# Patient Record
Sex: Female | Born: 1960 | Race: White | Hispanic: No | Smoking: Former smoker
Health system: Southern US, Community
[De-identification: ages and names within clinical notes are randomized; demographics above are authoritative.]

## PROBLEM LIST (undated history)

## (undated) DIAGNOSIS — F419 Anxiety disorder, unspecified: Secondary | ICD-10-CM

## (undated) DIAGNOSIS — T7840XA Allergy, unspecified, initial encounter: Secondary | ICD-10-CM

## (undated) HISTORY — PX: TONSILLECTOMY AND ADENOIDECTOMY: SUR1326

## (undated) HISTORY — DX: Allergy, unspecified, initial encounter: T78.40XA

## (undated) HISTORY — DX: Anxiety disorder, unspecified: F41.9

## (undated) HISTORY — PX: SKIN CANCER EXCISION: SHX779

---

## 1998-07-31 ENCOUNTER — Other Ambulatory Visit: Admission: RE | Admit: 1998-07-31 | Discharge: 1998-07-31 | Payer: Self-pay | Admitting: Obstetrics and Gynecology

## 1998-12-08 ENCOUNTER — Inpatient Hospital Stay (HOSPITAL_COMMUNITY): Admission: AD | Admit: 1998-12-08 | Discharge: 1998-12-11 | Payer: Self-pay | Admitting: Obstetrics and Gynecology

## 1998-12-09 ENCOUNTER — Encounter: Payer: Self-pay | Admitting: Obstetrics and Gynecology

## 1998-12-13 ENCOUNTER — Observation Stay (HOSPITAL_COMMUNITY): Admission: AD | Admit: 1998-12-13 | Discharge: 1998-12-14 | Payer: Self-pay | Admitting: Obstetrics and Gynecology

## 1999-01-06 ENCOUNTER — Inpatient Hospital Stay (HOSPITAL_COMMUNITY): Admission: AD | Admit: 1999-01-06 | Discharge: 1999-01-08 | Payer: Self-pay | Admitting: Obstetrics & Gynecology

## 1999-02-14 ENCOUNTER — Other Ambulatory Visit: Admission: RE | Admit: 1999-02-14 | Discharge: 1999-02-14 | Payer: Self-pay | Admitting: Obstetrics and Gynecology

## 2000-02-15 ENCOUNTER — Other Ambulatory Visit: Admission: RE | Admit: 2000-02-15 | Discharge: 2000-02-15 | Payer: Self-pay | Admitting: Obstetrics and Gynecology

## 2000-05-29 ENCOUNTER — Encounter: Payer: Self-pay | Admitting: Obstetrics and Gynecology

## 2000-05-29 ENCOUNTER — Encounter: Admission: RE | Admit: 2000-05-29 | Discharge: 2000-05-29 | Payer: Self-pay | Admitting: Obstetrics and Gynecology

## 2001-03-16 ENCOUNTER — Other Ambulatory Visit: Admission: RE | Admit: 2001-03-16 | Discharge: 2001-03-16 | Payer: Self-pay | Admitting: Obstetrics and Gynecology

## 2001-11-05 ENCOUNTER — Encounter: Payer: Self-pay | Admitting: Family Medicine

## 2001-11-05 ENCOUNTER — Encounter: Admission: RE | Admit: 2001-11-05 | Discharge: 2001-11-05 | Payer: Self-pay | Admitting: Family Medicine

## 2002-03-24 ENCOUNTER — Other Ambulatory Visit: Admission: RE | Admit: 2002-03-24 | Discharge: 2002-03-24 | Payer: Self-pay | Admitting: Obstetrics and Gynecology

## 2002-07-09 ENCOUNTER — Encounter: Payer: Self-pay | Admitting: Obstetrics and Gynecology

## 2002-07-09 ENCOUNTER — Encounter: Admission: RE | Admit: 2002-07-09 | Discharge: 2002-07-09 | Payer: Self-pay | Admitting: Obstetrics and Gynecology

## 2003-04-07 ENCOUNTER — Other Ambulatory Visit: Admission: RE | Admit: 2003-04-07 | Discharge: 2003-04-07 | Payer: Self-pay | Admitting: Obstetrics and Gynecology

## 2003-09-12 ENCOUNTER — Encounter: Admission: RE | Admit: 2003-09-12 | Discharge: 2003-09-12 | Payer: Self-pay | Admitting: Obstetrics and Gynecology

## 2004-09-17 ENCOUNTER — Ambulatory Visit (HOSPITAL_COMMUNITY): Admission: RE | Admit: 2004-09-17 | Discharge: 2004-09-17 | Payer: Self-pay | Admitting: Obstetrics and Gynecology

## 2004-09-21 ENCOUNTER — Encounter: Admission: RE | Admit: 2004-09-21 | Discharge: 2004-09-21 | Payer: Self-pay | Admitting: Family Medicine

## 2004-10-24 ENCOUNTER — Encounter: Admission: RE | Admit: 2004-10-24 | Discharge: 2004-11-20 | Payer: Self-pay | Admitting: Family Medicine

## 2005-10-02 ENCOUNTER — Ambulatory Visit (HOSPITAL_COMMUNITY): Admission: RE | Admit: 2005-10-02 | Discharge: 2005-10-02 | Payer: Self-pay | Admitting: Obstetrics and Gynecology

## 2006-07-16 ENCOUNTER — Ambulatory Visit: Payer: Self-pay | Admitting: Family Medicine

## 2006-07-23 ENCOUNTER — Ambulatory Visit: Payer: Self-pay | Admitting: Family Medicine

## 2006-09-04 ENCOUNTER — Ambulatory Visit: Payer: Self-pay | Admitting: Family Medicine

## 2006-10-07 ENCOUNTER — Ambulatory Visit (HOSPITAL_COMMUNITY): Admission: RE | Admit: 2006-10-07 | Discharge: 2006-10-07 | Payer: Self-pay | Admitting: Obstetrics and Gynecology

## 2007-03-02 ENCOUNTER — Encounter: Payer: Self-pay | Admitting: Family Medicine

## 2007-03-02 ENCOUNTER — Encounter (INDEPENDENT_AMBULATORY_CARE_PROVIDER_SITE_OTHER): Payer: Self-pay | Admitting: Family Medicine

## 2007-03-02 ENCOUNTER — Ambulatory Visit: Payer: Self-pay | Admitting: Family Medicine

## 2007-09-01 ENCOUNTER — Ambulatory Visit: Payer: Self-pay | Admitting: Family Medicine

## 2007-10-01 ENCOUNTER — Encounter (INDEPENDENT_AMBULATORY_CARE_PROVIDER_SITE_OTHER): Payer: Self-pay | Admitting: Family Medicine

## 2007-10-09 ENCOUNTER — Ambulatory Visit (HOSPITAL_COMMUNITY): Admission: RE | Admit: 2007-10-09 | Discharge: 2007-10-09 | Payer: Self-pay | Admitting: Obstetrics and Gynecology

## 2008-06-01 ENCOUNTER — Ambulatory Visit: Payer: Self-pay | Admitting: Internal Medicine

## 2008-08-05 ENCOUNTER — Telehealth (INDEPENDENT_AMBULATORY_CARE_PROVIDER_SITE_OTHER): Payer: Self-pay | Admitting: *Deleted

## 2008-08-18 ENCOUNTER — Ambulatory Visit: Payer: Self-pay | Admitting: Internal Medicine

## 2008-09-13 ENCOUNTER — Encounter: Admission: RE | Admit: 2008-09-13 | Discharge: 2008-09-13 | Payer: Self-pay | Admitting: Obstetrics and Gynecology

## 2008-10-10 ENCOUNTER — Ambulatory Visit (HOSPITAL_COMMUNITY): Admission: RE | Admit: 2008-10-10 | Discharge: 2008-10-10 | Payer: Self-pay | Admitting: Obstetrics and Gynecology

## 2008-10-19 ENCOUNTER — Encounter: Admission: RE | Admit: 2008-10-19 | Discharge: 2008-10-19 | Payer: Self-pay | Admitting: Obstetrics and Gynecology

## 2009-01-10 ENCOUNTER — Ambulatory Visit: Payer: Self-pay | Admitting: Internal Medicine

## 2009-01-17 ENCOUNTER — Telehealth: Payer: Self-pay | Admitting: Internal Medicine

## 2009-01-20 ENCOUNTER — Telehealth: Payer: Self-pay | Admitting: Internal Medicine

## 2009-02-27 ENCOUNTER — Ambulatory Visit: Payer: Self-pay | Admitting: Internal Medicine

## 2009-02-27 LAB — CONVERTED CEMR LAB
Albumin: 3.6 g/dL (ref 3.5–5.2)
Alkaline Phosphatase: 36 units/L — ABNORMAL LOW (ref 39–117)
BUN: 15 mg/dL (ref 6–23)
Basophils Absolute: 0 10*3/uL (ref 0.0–0.1)
Basophils Relative: 0.3 % (ref 0.0–3.0)
CO2: 28 meq/L (ref 19–32)
Chloride: 110 meq/L (ref 96–112)
Creatinine, Ser: 0.8 mg/dL (ref 0.4–1.2)
Eosinophils Relative: 2.1 % (ref 0.0–5.0)
Glucose, Bld: 79 mg/dL (ref 70–99)
HCT: 36.7 % (ref 36.0–46.0)
Hemoglobin: 12.7 g/dL (ref 12.0–15.0)
LDL Cholesterol: 57 mg/dL (ref 0–99)
Lymphs Abs: 2.2 10*3/uL (ref 0.7–4.0)
Monocytes Relative: 7.5 % (ref 3.0–12.0)
Neutro Abs: 3.6 10*3/uL (ref 1.4–7.7)
RBC: 3.65 M/uL — ABNORMAL LOW (ref 3.87–5.11)
RDW: 11.9 % (ref 11.5–14.6)
Total CHOL/HDL Ratio: 2
Triglycerides: 93 mg/dL (ref 0.0–149.0)

## 2009-02-28 ENCOUNTER — Encounter (INDEPENDENT_AMBULATORY_CARE_PROVIDER_SITE_OTHER): Payer: Self-pay | Admitting: *Deleted

## 2009-02-28 ENCOUNTER — Encounter: Payer: Self-pay | Admitting: Internal Medicine

## 2009-03-02 ENCOUNTER — Telehealth (INDEPENDENT_AMBULATORY_CARE_PROVIDER_SITE_OTHER): Payer: Self-pay | Admitting: *Deleted

## 2009-03-08 ENCOUNTER — Ambulatory Visit: Payer: Self-pay | Admitting: Internal Medicine

## 2009-03-08 ENCOUNTER — Telehealth (INDEPENDENT_AMBULATORY_CARE_PROVIDER_SITE_OTHER): Payer: Self-pay | Admitting: *Deleted

## 2009-03-10 ENCOUNTER — Encounter (INDEPENDENT_AMBULATORY_CARE_PROVIDER_SITE_OTHER): Payer: Self-pay | Admitting: *Deleted

## 2009-03-10 ENCOUNTER — Ambulatory Visit: Payer: Self-pay | Admitting: Internal Medicine

## 2009-08-02 ENCOUNTER — Ambulatory Visit: Payer: Self-pay | Admitting: Internal Medicine

## 2009-10-16 ENCOUNTER — Encounter: Admission: RE | Admit: 2009-10-16 | Discharge: 2009-10-16 | Payer: Self-pay | Admitting: Obstetrics and Gynecology

## 2009-10-24 ENCOUNTER — Encounter: Admission: RE | Admit: 2009-10-24 | Discharge: 2009-10-24 | Payer: Self-pay | Admitting: Obstetrics and Gynecology

## 2009-12-26 ENCOUNTER — Ambulatory Visit: Payer: Self-pay | Admitting: Family Medicine

## 2010-02-15 ENCOUNTER — Encounter: Admission: RE | Admit: 2010-02-15 | Discharge: 2010-02-15 | Payer: Self-pay | Admitting: Obstetrics & Gynecology

## 2010-05-18 ENCOUNTER — Ambulatory Visit: Payer: Self-pay | Admitting: Internal Medicine

## 2010-05-18 DIAGNOSIS — R109 Unspecified abdominal pain: Secondary | ICD-10-CM

## 2010-05-18 DIAGNOSIS — M79609 Pain in unspecified limb: Secondary | ICD-10-CM | POA: Insufficient documentation

## 2010-05-18 LAB — CONVERTED CEMR LAB
Bilirubin Urine: NEGATIVE
Glucose, Urine, Semiquant: NEGATIVE
Ketones, urine, test strip: NEGATIVE
Protein, U semiquant: NEGATIVE
Urobilinogen, UA: 0.2
pH: 6

## 2010-05-19 ENCOUNTER — Encounter: Payer: Self-pay | Admitting: Internal Medicine

## 2010-07-24 ENCOUNTER — Ambulatory Visit: Payer: Self-pay | Admitting: Internal Medicine

## 2010-08-27 ENCOUNTER — Ambulatory Visit: Payer: Self-pay | Admitting: Family Medicine

## 2010-10-17 ENCOUNTER — Encounter
Admission: RE | Admit: 2010-10-17 | Discharge: 2010-10-17 | Payer: Self-pay | Source: Home / Self Care | Attending: Obstetrics and Gynecology | Admitting: Obstetrics and Gynecology

## 2010-10-19 ENCOUNTER — Ambulatory Visit: Payer: Self-pay | Admitting: Internal Medicine

## 2010-10-19 DIAGNOSIS — J0110 Acute frontal sinusitis, unspecified: Secondary | ICD-10-CM | POA: Insufficient documentation

## 2010-10-28 HISTORY — PX: REFRACTIVE SURGERY: SHX103

## 2010-11-01 ENCOUNTER — Encounter
Admission: RE | Admit: 2010-11-01 | Discharge: 2010-11-01 | Payer: Self-pay | Source: Home / Self Care | Attending: Obstetrics and Gynecology | Admitting: Obstetrics and Gynecology

## 2010-11-05 ENCOUNTER — Telehealth: Payer: Self-pay | Admitting: Internal Medicine

## 2010-11-18 ENCOUNTER — Encounter: Payer: Self-pay | Admitting: Obstetrics and Gynecology

## 2010-11-27 NOTE — Assessment & Plan Note (Signed)
Summary: FOOT INFECTION/GROWING PROBLEM//KN   Vital Signs:  Patient profile:   50 year old female Weight:      116 pounds Temp:     98.6 degrees F oral Pulse rate:   72 / minute Resp:     16 per minute BP sitting:   120 / 78  (left arm) Cuff size:   regular  Vitals Entered By: Shonna Chock CMA (May 18, 2010 3:48 PM) CC: 1.) Foot infection   2.) Examine mouth- sore on tounge x several days and roof of mouth feels weird  3.) Burning (vaginal/pubic area-not when urinating, feels like the burning is on the inside   Primary Care Provider:  Alwyn Ren  CC:  1.) Foot infection   2.) Examine mouth- sore on tounge x several days and roof of mouth feels weird  3.) Burning (vaginal/pubic area-not when urinating and feels like the burning is on the inside.  History of Present Illness: #1)Burning pain  intermittently @ base of L 5th toe related to foot wear & activity; better with tennis shoes. #2) sore  tongue tip X 5 days; sore of palate this am; #3) vaginal burning > 1 week. Menses completed today.Gyn exam WNL 07/2009.  Current Medications (verified): 1)  Acyclovir 400 Mg  Tabs (Acyclovir) .Marland Kitchen.. 1 By Mouth Three Times A Day For 5 Days and Prn 2)  Junel 1/20 1-20 Mg-Mcg Tabs (Norethindrone Acet-Ethinyl Est)  Allergies (verified): No Known Drug Allergies  Review of Systems General:  Denies chills, fever, and sweats; Weight up 3#  on  purpose. ENT:  Denies difficulty swallowing, ear discharge, earache, nasal congestion, postnasal drainage, and sore throat. GU:  Denies abnormal vaginal bleeding, discharge, dysuria, and hematuria. MS:  Denies joint pain, joint redness, and joint swelling. Derm:  Denies lesion(s) and rash.  Physical Exam  General:  Thin but well-nourished,in no acute distress; alert,appropriate and cooperative throughout examination Mouth:  Tiny erythematous tongue lesion & small papule anterior palate behind teeth Pulses:  R and L dorsalis pedis and posterior tibial pulses are  full and equal bilaterally Extremities:  No clubbing, cyanosis, edema. No pain toROM or compression or palpation L foot Skin:  Intact without suspicious lesions or rashes Cervical Nodes:  No lymphadenopathy noted Axillary Nodes:  No palpable lymphadenopathy   Impression & Recommendations:  Problem # 1:  FOOT PAIN, LEFT (ICD-729.5) ? Morton's Neuroma  Problem # 2:  ORAL APHTHAE (ICD-528.2) PMH of oral herpes  Problem # 3:  PELVIC  PAIN (ICD-789.09)  Orders: UA Dipstick w/o Micro (manual) (29562)  Complete Medication List: 1)  Acyclovir 400 Mg Tabs (Acyclovir) .Marland Kitchen.. 1 by mouth three times a day for 5 days and prn 2)  Junel 1/20 1-20 Mg-mcg Tabs (Norethindrone acet-ethinyl est)  Patient Instructions: 1)  Keep Diary of incidences of oral lesions; consider suppressive therapy with > 7-9 episodes/ year. Support shoes ; Podiatry or Foot Specialist if no better. Prescriptions: ACYCLOVIR 400 MG  TABS (ACYCLOVIR) 1 by mouth three times a day for 5 days and prn  #90 x 1   Entered and Authorized by:   Marga Melnick MD   Signed by:   Marga Melnick MD on 05/18/2010   Method used:   Print then Give to Patient   RxID:   [id number]   Laboratory Results   Urine Tests    Routine Urinalysis   Color: lt. yellow Appearance: Clear Glucose: negative   (Normal Range: Negative) Bilirubin: negative   (Normal Range: Negative) Ketone: negative   (  Normal Range: Negative) Spec. Gravity: 1.015   (Normal Range: 1.003-1.035) Blood: large   (Normal Range: Negative) pH: 6.0   (Normal Range: 5.0-8.0) Protein: negative   (Normal Range: Negative) Urobilinogen: 0.2   (Normal Range: 0-1) Nitrite: negative   (Normal Range: Negative) Leukocyte Esterace: negative   (Normal Range: Negative)    Comments: LMP: 05/12/2010 (Saturday)

## 2010-11-27 NOTE — Assessment & Plan Note (Signed)
Summary: SINUS INFECTION//PH   Vital Signs:  Patient profile:   50 year old female Weight:      117.2 pounds BMI:     20.19 Temp:     98.1 degrees F oral Pulse rate:   64 / minute Resp:     12 per minute BP sitting:   108 / 60  (left arm) Cuff size:   regular  Vitals Entered By: Shonna Chock CMA (July 24, 2010 11:56 AM) CC: Sinus symptoms x 8 days, URI symptoms   Primary Care Evelyn Moch:  Alwyn Ren  CC:  Sinus symptoms x 8 days and URI symptoms.  History of Present Illness: URI Symptoms      This is a 50 year old woman who presents with URI symptoms X 8 days.  The patient reports nasal congestion,slight  purulent nasal discharge, and slightly productive cough, but denies sore throat and earache.  The patient denies fever.  The patient also reports headache.  Risk factors for Strep sinusitis include bilateral facial pain.  The patient denies the following risk factors for Strep sinusitis: tooth pain and tender adenopathy.  Rx: OTC multi cold symptom med  Current Medications (verified): 1)  Acyclovir 400 Mg  Tabs (Acyclovir) .Marland Kitchen.. 1 By Mouth Three Times A Day For 5 Days and Prn 2)  Junel 1/20 1-20 Mg-Mcg Tabs (Norethindrone Acet-Ethinyl Est)  Allergies (verified): No Known Drug Allergies  Physical Exam  General:  Thin,in no acute distress; alert,appropriate and cooperative throughout examination Ears:  External ear exam shows no significant lesions or deformities.  Otoscopic examination reveals clear canals, tympanic membranes are intact bilaterally without bulging, retraction, inflammation or discharge. Hearing is grossly normal bilaterally. Nose:  External nasal examination shows no deformity or inflammation. Nasal mucosa are pink and moist without lesions or exudates. Mouth:  Oral mucosa and oropharynx without lesions or exudates.  Teeth in good repair. Lungs:  Normal respiratory effort, chest expands symmetrically. Lungs are clear to auscultation, no crackles or  wheezes. Cervical Nodes:  No lymphadenopathy noted Axillary Nodes:  No palpable lymphadenopathy   Impression & Recommendations:  Problem # 1:  SINUSITIS - ACUTE-NOS (ICD-461.9)  Her updated medication list for this problem includes:    Smz-tmp Ds 800-160 Mg Tabs (Sulfamethoxazole-trimethoprim) .Marland Kitchen... 1 two times a day with 8 oz water  Complete Medication List: 1)  Acyclovir 400 Mg Tabs (Acyclovir) .Marland Kitchen.. 1 by mouth three times a day for 5 days and prn 2)  Junel 1/20 1-20 Mg-mcg Tabs (Norethindrone acet-ethinyl est) 3)  Smz-tmp Ds 800-160 Mg Tabs (Sulfamethoxazole-trimethoprim) .Marland Kitchen.. 1 two times a day with 8 oz water  Patient Instructions: 1)  Neti pot once daily until sinuses are clear. 2)  Drink as much NON dairy  fluid as you can tolerate for the next few days. Prescriptions: SMZ-TMP DS 800-160 MG TABS (SULFAMETHOXAZOLE-TRIMETHOPRIM) 1 two times a day with 8 oz water  #20 x 0   Entered and Authorized by:   Marga Melnick MD   Signed by:   Marga Melnick MD on 07/24/2010   Method used:   Faxed to ...       [facility] (retail)       [address]       [place], [place]  [address]       Ph: [phone]       Fax: [phone]   RxID:   [id number]

## 2010-11-27 NOTE — Assessment & Plan Note (Signed)
Summary: flu shot///sph  Nurse Visit Flu Vaccine Consent Questions     Do you have a history of severe allergic reactions to this vaccine? no    Any prior history of allergic reactions to egg and/or gelatin? no    Do you have a sensitivity to the preservative Thimersol? no    Do you have a past history of Guillan-Barre Syndrome? no    Do you currently have an acute febrile illness? no    Have you ever had a severe reaction to latex? no    Vaccine information given and explained to patient? yes    Are you currently pregnant? no    Lot Number:[id number]   Exp Date:04/27/2011   Site Given  Left Deltoid IM    Allergies: No Known Drug Allergies  Orders Added: 1)  Admin 1st Vaccine [90471] 2)  Flu Vaccine 23yrs + [16109]

## 2010-11-27 NOTE — Letter (Signed)
Summary: Call a Nurse  Call a Nurse   Imported By: Lanelle Bal 05/29/2010 11:25:18  _____________________________________________________________________  External Attachment:    Type:   Image     Comment:   External Document

## 2010-11-27 NOTE — Assessment & Plan Note (Signed)
Summary: sinus infection x 1 week//lch   Vital Signs:  Patient profile:   50 year old female Height:      64 inches Weight:      125 pounds BMI:     21.53 Temp:     98.2 degrees F oral Pulse rate:   67 / minute Pulse rhythm:   regular BP sitting:   102 / 70  (left arm) Cuff size:   regular  Vitals Entered By: Army Fossa CMA (December 26, 2009 1:33 PM) CC: Pt c/o pressure in face, HA, drainage x several weeks, URI symptoms   History of Present Illness:       This is a 50 year old woman who presents with URI symptoms.  The symptoms began 3 weeks ago.  The patient complains of nasal congestion, purulent nasal discharge, dry cough, and sick contacts.  The patient denies fever, low-grade fever (<100.5 degrees), fever of 100.5-103 degrees, fever of 103.1-104 degrees, fever to >104 degrees, stiff neck, dyspnea, wheezing, rash, vomiting, diarrhea, use of an antipyretic, and response to antipyretic.  The patient also reports headache.  The patient denies itchy watery eyes, itchy throat, sneezing, seasonal symptoms, response to antihistamine, muscle aches, and severe fatigue.  The patient denies the following risk factors for Strep sinusitis: unilateral facial pain, unilateral nasal discharge, poor response to decongestant, double sickening, tooth pain, Strep exposure, tender adenopathy, and absence of cough.    Current Medications (verified): 1)  Acyclovir 400 Mg  Tabs (Acyclovir) .Marland Kitchen.. 1 By Mouth Three Times A Day For 5 Days and Prn 2)  Junel 1/20 1-20 Mg-Mcg Tabs (Norethindrone Acet-Ethinyl Est) 3)  Ceftin 500 Mg Tabs (Cefuroxime Axetil) .Marland Kitchen.. 1 By Mouth Two Times A Day 4)  Nasonex 50 Mcg/act Susp (Mometasone Furoate) .... 2 Sprays Each Nostril Once Daily  Allergies (verified): No Known Drug Allergies  Past History:  Past medical, surgical, family and social histories (including risk factors) reviewed for relevance to current acute and chronic problems.  Past Medical History: Reviewed  history from 03/08/2009 and no changes required. Herpes Labialis, recurrent; Plantar Fascitis, PMH of 2008  Past Surgical History: Reviewed history from 03/08/2009 and no changes required. G2 P2; Wisdom teeth extraction  Family History: Reviewed history from 03/08/2009 and no changes required. Father: CVAs,CAD,pacer,prostate CA,? Parkinson's Mother: Alsheimer's, Osteoporosis Siblings: neg; MGF Alsheimer's ; M aunt ?Gyn CA ; Maunt Alsheimer's  Social History: Reviewed history from 03/08/2009 and no changes required. Occupation: Part time Substitute Teaching Married Former Smoker: quit 12/09, sporadic previously Alcohol use-yes Regular exercise-yes: CVE 1.5 hrs daily   Review of Systems      See HPI  Physical Exam  General:  Well-developed,well-nourished,in no acute distress; alert,appropriate and cooperative throughout examination Ears:  External ear exam shows no significant lesions or deformities.  Otoscopic examination reveals clear canals, tympanic membranes are intact bilaterally without bulging, retraction, inflammation or discharge. Hearing is grossly normal bilaterally. Nose:  L frontal sinus tenderness, L maxillary sinus tenderness, R frontal sinus tenderness, and R maxillary sinus tenderness.   Mouth:  Oral mucosa and oropharynx without lesions or exudates.  Teeth in good repair. Neck:  No deformities, masses, or tenderness noted. Lungs:  Normal respiratory effort, chest expands symmetrically. Lungs are clear to auscultation, no crackles or wheezes. Heart:  Normal rate and regular rhythm. S1 and S2 normal without gallop, murmur, click, rub or other extra sounds. Psych:  Cognition and judgment appear intact. Alert and cooperative with normal attention span and concentration. No apparent  delusions, illusions, hallucinations   Impression & Recommendations:  Problem # 1:  SINUSITIS - ACUTE-NOS (ICD-461.9)  The following medications were removed from the medication list:     Amoxicillin-pot Clavulanate 500-125 Mg Tabs (Amoxicillin-pot clavulanate) .Marland Kitchen... 1 q 12 hrs with a meal    Flagyl 250 Mg Tabs (Metronidazole) .Marland Kitchen... Take  three times a day (no alcohol) Her updated medication list for this problem includes:    Ceftin 500 Mg Tabs (Cefuroxime axetil) .Marland Kitchen... 1 by mouth two times a day    Nasonex 50 Mcg/act Susp (Mometasone furoate) .Marland Kitchen... 2 sprays each nostril once daily  Instructed on treatment. Call if symptoms persist or worsen.   Complete Medication List: 1)  Acyclovir 400 Mg Tabs (Acyclovir) .Marland Kitchen.. 1 by mouth three times a day for 5 days and prn 2)  Junel 1/20 1-20 Mg-mcg Tabs (Norethindrone acet-ethinyl est) 3)  Ceftin 500 Mg Tabs (Cefuroxime axetil) .Marland Kitchen.. 1 by mouth two times a day 4)  Nasonex 50 Mcg/act Susp (Mometasone furoate) .... 2 sprays each nostril once daily Prescriptions: NASONEX 50 MCG/ACT SUSP (MOMETASONE FUROATE) 2 sprays each nostril once daily  #1 x 0   Entered and Authorized by:   Loreen Freud DO   Signed by:   Loreen Freud DO on 12/26/2009   Method used:   Historical   RxID:   [id number] CEFTIN 500 MG TABS (CEFUROXIME AXETIL) 1 by mouth two times a day  #20 x 0   Entered and Authorized by:   Loreen Freud DO   Signed by:   Loreen Freud DO on 12/26/2009   Method used:   Electronically to        [facility] (retail)       [address]       [place], [place]  [address]       Ph: [phone]       Fax: [phone]   RxID:   [id number] ACYCLOVIR 400 MG  TABS (ACYCLOVIR) 1 by mouth three times a day for 5 days and prn  #90 x 1   Entered and Authorized by:   Loreen Freud DO   Signed by:   Loreen Freud DO on 12/26/2009   Method used:   Electronically to        [facility] (retail)       [address]       [place], [place]  [address]       Ph: [phone]       Fax: [phone]   RxID:   [id number]

## 2010-11-29 NOTE — Assessment & Plan Note (Signed)
Summary: sinus inf for few days, no fever///sph   Vital Signs:  Patient profile:   50 year old female Weight:      114.8 pounds BMI:     19.78 Temp:     98.2 degrees F oral Pulse rate:   72 / minute Resp:     15 per minute BP sitting:   104 / 70  (left arm) Cuff size:   regular  Vitals Entered By: Shonna Chock CMA (October 19, 2010 11:17 AM) CC: Sinus Infection x 2-3 days , URI symptoms   Primary Care Provider:  Alwyn Ren  CC:  Sinus Infection x 2-3 days  and URI symptoms.  History of Present Illness:      This is a 50 year old woman who presents with URI symptoms; onset 12/18 as throat congestion.  The patient denies purulent nasal discharge, sore throat, productive cough, and earache.  The patient denies fever, dyspnea, and wheezing.  The patient also reports headache & some bilateral facial pain.  The patient denies the following risk factors for Strep sinusitis: tooth pain and tender adenopathy. Rx: Neti pot .   Current Medications (verified): 1)  Acyclovir 400 Mg  Tabs (Acyclovir) .Marland Kitchen.. 1 By Mouth Three Times A Day For 5 Days and Prn 2)  Junel 1/20 1-20 Mg-Mcg Tabs (Norethindrone Acet-Ethinyl Est)  Allergies (verified): No Known Drug Allergies  Physical Exam  General:  Thin,in no acute distress; alert,appropriate and cooperative throughout examination Ears:  External ear exam shows no significant lesions or deformities.  Otoscopic examination reveals clear canals, tympanic membranes are intact bilaterally without bulging, retraction, inflammation or discharge. Hearing is grossly normal bilaterally. Nose:  External nasal examination shows no deformity or inflammation. Nasal mucosa are pink and moist without lesions or exudates. Hyponasal speech Mouth:  Oral mucosa and oropharynx without lesions or exudates.  Teeth in good repair. Lungs:  Normal respiratory effort, chest expands symmetrically. Lungs are clear to auscultation, no crackles or wheezes. Heart:  normal rate, regular  rhythm, no gallop, no rub, and grade 1 /6 systolic murmur @ LSB.   Cervical Nodes:  No lymphadenopathy noted Axillary Nodes:  No palpable lymphadenopathy   Impression & Recommendations:  Problem # 1:  ACUTE FRONTAL SINUSITIS (ICD-461.1)  The following medications were removed from the medication list:    Smz-tmp Ds 800-160 Mg Tabs (Sulfamethoxazole-trimethoprim) .Marland Kitchen... 1 two times a day with 8 oz water Her updated medication list for this problem includes:    Amoxicillin 500 Mg Caps (Amoxicillin) .Marland Kitchen... 1 three times a day  Complete Medication List: 1)  Acyclovir 400 Mg Tabs (Acyclovir) .Marland Kitchen.. 1 by mouth three times a day for 5 days and prn 2)  Junel 1/20 1-20 Mg-mcg Tabs (Norethindrone acet-ethinyl est) 3)  Amoxicillin 500 Mg Caps (Amoxicillin) .Marland Kitchen.. 1 three times a day  Patient Instructions: 1)  Neti pot once daily - two times a day  followed by Nasonex . 2)  Drink as much  NON dairy fluid as you can tolerate for the next few days. Prescriptions: AMOXICILLIN 500 MG CAPS (AMOXICILLIN) 1 three times a day  #30 x 0   Entered and Authorized by:   Marga Melnick MD   Signed by:   Marga Melnick MD on 10/19/2010   Method used:   Print then Give to Patient   RxID:   [id number]    Orders Added: 1)  Est. Patient Level III [14782]

## 2010-11-29 NOTE — Progress Notes (Signed)
Summary: ABX request  Phone Note Call from Patient Call back at Work Phone [phone]   Summary of Call: Patient left message on triage that she is not feeling better from recent sinus infection and would like another round of ABX. On 12/23 the pt was given AMOXICILLIN 500 MG CAPS (AMOXICILLIN) 1 three times a day  #30 x 0.  Please advise. Initial call taken by: Lucious Groves CMA,  November 05, 2010 10:51 AM  Follow-up for Phone Call        see generic Flagyl Rx  Additional Follow-up for Phone Call Additional follow up Details #1::        RX was given while patient was in office with her son. Additional Follow-up by: Lucious Groves CMA,  November 05, 2010 4:17 PM    New/Updated Medications: METRONIDAZOLE 500 MG TABS (METRONIDAZOLE) 1 three times a day (avoid alcohol on this) Prescriptions: METRONIDAZOLE 500 MG TABS (METRONIDAZOLE) 1 three times a day (avoid alcohol on this)  #21 x 0   Entered by:   Lucious Groves CMA   Authorized by:   Marga Melnick MD   Signed by:   Lucious Groves CMA on 11/05/2010   Method used:   Print then Give to Patient   RxID:   [id number]

## 2010-12-13 ENCOUNTER — Telehealth (INDEPENDENT_AMBULATORY_CARE_PROVIDER_SITE_OTHER): Payer: Self-pay | Admitting: *Deleted

## 2010-12-19 NOTE — Progress Notes (Signed)
Summary: Acyclovir refill  Phone Note Refill Request Call back at cell----[phone] Message from:  Patient on December 13, 2010 11:22 AM  Refills Requested: Medication #1:  ACYCLOVIR 400 MG  TABS 1 by mouth three times a day for 5 days and prn [facility] [address]  Initial call taken by: Jerolyn Shin,  December 13, 2010 11:23 AM    Prescriptions: ACYCLOVIR 400 MG  TABS (ACYCLOVIR) 1 by mouth three times a day for 5 days and prn  #90 x 2   Entered by:   Shonna Chock CMA   Authorized by:   Marga Melnick MD   Signed by:   Shonna Chock CMA on 12/13/2010   Method used:   Electronically to        [facility] (retail)       [address]       [place], [place]  [address]       Ph: [phone]       Fax: [phone]   RxID:   [id number]

## 2011-08-22 ENCOUNTER — Ambulatory Visit (INDEPENDENT_AMBULATORY_CARE_PROVIDER_SITE_OTHER): Payer: BC Managed Care – PPO

## 2011-08-22 DIAGNOSIS — Z23 Encounter for immunization: Secondary | ICD-10-CM

## 2011-09-05 ENCOUNTER — Telehealth: Payer: Self-pay

## 2011-09-05 NOTE — Telephone Encounter (Signed)
Pt left message to c/o tick bite on stomach. She states that she pulled it off and has a tiny red mark there.   Per Dr. Alwyn Ren, pt needs to watch for rash and/or fever and to call office to be seen or visit ER is sxs become emergent.  Left message on personally identified voicemail to notify pt of MD's message and advised her to call office with questions or concerns

## 2011-09-09 ENCOUNTER — Ambulatory Visit (INDEPENDENT_AMBULATORY_CARE_PROVIDER_SITE_OTHER): Payer: BC Managed Care – PPO | Admitting: Family Medicine

## 2011-09-09 ENCOUNTER — Encounter: Payer: Self-pay | Admitting: Family Medicine

## 2011-09-09 VITALS — BP 118/65 | HR 65 | Temp 98.1°F | Ht 64.0 in | Wt 116.4 lb

## 2011-09-09 DIAGNOSIS — J0110 Acute frontal sinusitis, unspecified: Secondary | ICD-10-CM

## 2011-09-09 MED ORDER — ACYCLOVIR 200 MG PO CAPS: 200.0000 mg | ORAL_CAPSULE | Freq: Three times a day (TID) | ORAL | Status: DC

## 2011-09-09 MED ORDER — CLARITHROMYCIN ER 500 MG PO TB24: 1000.0000 mg | ORAL_TABLET | Freq: Every day | ORAL | Status: AC

## 2011-09-09 NOTE — Progress Notes (Signed)
  Subjective:    Patient ID: Adriana Lopez, female    DOB: Oct 06, 1961, 50 y.o.   MRN: [id number]  HPI ? Sinus infxn- sxs started last week.  Using OTC allergy pill daily.  Having facial pressure, nasal congestion.  No ear pain.  + sneezing.  No fevers.  Hx of similar.  No known sick contacts.   Review of Systems For ROS see HPI     Objective:   Physical Exam  Vitals reviewed. Constitutional: She appears well-developed and well-nourished. No distress.  HENT:  Head: Normocephalic and atraumatic.  Right Ear: Tympanic membrane normal.  Left Ear: Tympanic membrane normal.  Nose: Mucosal edema and rhinorrhea present. Right sinus exhibits maxillary sinus tenderness and frontal sinus tenderness. Left sinus exhibits maxillary sinus tenderness and frontal sinus tenderness.  Mouth/Throat: Uvula is midline and mucous membranes are normal. Posterior oropharyngeal erythema present. No oropharyngeal exudate.  Eyes: Conjunctivae and EOM are normal. Pupils are equal, round, and reactive to light.  Neck: Normal range of motion. Neck supple.  Cardiovascular: Normal rate, regular rhythm and normal heart sounds.   Pulmonary/Chest: Effort normal and breath sounds normal. No respiratory distress. She has no wheezes.  Lymphadenopathy:    She has no cervical adenopathy.          Assessment & Plan:

## 2011-09-09 NOTE — Assessment & Plan Note (Signed)
sxs and PE consistent w/ infxn.  Start abx.  Reviewed supportive care and red flags that should prompt return.  Pt expressed understanding and is in agreement w/ plan.  

## 2011-09-09 NOTE — Patient Instructions (Signed)
This is a sinus infection Take the biaxin as directed- take w/ food to avoid upset stomach Add mucinex to thin your congestion Drink plenty of fluids Hang in there!!!

## 2011-10-08 ENCOUNTER — Other Ambulatory Visit: Payer: Self-pay | Admitting: Obstetrics and Gynecology

## 2011-10-08 DIAGNOSIS — Z1231 Encounter for screening mammogram for malignant neoplasm of breast: Secondary | ICD-10-CM

## 2011-11-06 ENCOUNTER — Ambulatory Visit
Admission: RE | Admit: 2011-11-06 | Discharge: 2011-11-06 | Disposition: A | Discharge status: Home / Self Care | Payer: BC Managed Care – PPO | Source: Ambulatory Visit | Attending: Obstetrics and Gynecology | Admitting: Obstetrics and Gynecology

## 2011-11-06 DIAGNOSIS — Z1231 Encounter for screening mammogram for malignant neoplasm of breast: Secondary | ICD-10-CM

## 2011-11-12 ENCOUNTER — Other Ambulatory Visit: Payer: Self-pay | Admitting: Obstetrics and Gynecology

## 2011-11-12 DIAGNOSIS — R928 Other abnormal and inconclusive findings on diagnostic imaging of breast: Secondary | ICD-10-CM

## 2011-11-14 ENCOUNTER — Ambulatory Visit (INDEPENDENT_AMBULATORY_CARE_PROVIDER_SITE_OTHER): Payer: BC Managed Care – PPO | Admitting: Family Medicine

## 2011-11-14 ENCOUNTER — Encounter: Payer: Self-pay | Admitting: Family Medicine

## 2011-11-14 VITALS — BP 115/78 | HR 60 | Temp 97.8°F | Ht 64.0 in | Wt 116.2 lb

## 2011-11-14 DIAGNOSIS — J0110 Acute frontal sinusitis, unspecified: Secondary | ICD-10-CM

## 2011-11-14 MED ORDER — ACYCLOVIR 200 MG PO CAPS: 200.0000 mg | ORAL_CAPSULE | Freq: Three times a day (TID) | ORAL | Status: AC

## 2011-11-14 MED ORDER — AMOXICILLIN 875 MG PO TABS: 875.0000 mg | ORAL_TABLET | Freq: Two times a day (BID) | ORAL | Status: DC

## 2011-11-14 NOTE — Progress Notes (Signed)
  Subjective:    Patient ID: Adriana Lopez, female    DOB: 08-29-1961, 51 y.o.   MRN: [id number]  HPI Sinus infxn- sxs started over the weekend w/ 'simple cold'.  2 days ago developed facial pressure, nasal congestion.  + fatigue.  Mild cough.  No fever.  + sick contacts.   Review of Systems For ROS see HPI     Objective:   Physical Exam  Vitals reviewed. Constitutional: She appears well-developed and well-nourished. No distress.  HENT:  Head: Normocephalic and atraumatic.  Right Ear: Tympanic membrane normal.  Left Ear: Tympanic membrane normal.  Nose: Mucosal edema and rhinorrhea present. Right sinus exhibits maxillary sinus tenderness and frontal sinus tenderness. Left sinus exhibits maxillary sinus tenderness and frontal sinus tenderness.  Mouth/Throat: Uvula is midline and mucous membranes are normal. Posterior oropharyngeal erythema present. No oropharyngeal exudate.  Eyes: Conjunctivae and EOM are normal. Pupils are equal, round, and reactive to light.  Neck: Normal range of motion. Neck supple.  Cardiovascular: Normal rate, regular rhythm and normal heart sounds.   Pulmonary/Chest: Effort normal and breath sounds normal. No respiratory distress. She has no wheezes.  Lymphadenopathy:    She has no cervical adenopathy.          Assessment & Plan:

## 2011-11-14 NOTE — Assessment & Plan Note (Signed)
Pt's sxs and PE consistent w/ infxn.  Start abx.  Reviewed supportive care and red flags that should prompt return.  Pt expressed understanding and is in agreement w/ plan.  

## 2011-11-14 NOTE — Patient Instructions (Signed)
You have a sinus infection Start the Amoxicillin- take w/ food to avoid upset stomach Drink plenty of fluids REST! Call with any questions or concerns Hang in there!!!

## 2011-11-25 ENCOUNTER — Ambulatory Visit
Admission: RE | Admit: 2011-11-25 | Discharge: 2011-11-25 | Disposition: A | Discharge status: Home / Self Care | Payer: BC Managed Care – PPO | Source: Ambulatory Visit | Attending: Obstetrics and Gynecology | Admitting: Obstetrics and Gynecology

## 2011-11-25 DIAGNOSIS — R928 Other abnormal and inconclusive findings on diagnostic imaging of breast: Secondary | ICD-10-CM

## 2012-03-06 ENCOUNTER — Telehealth: Payer: Self-pay | Admitting: *Deleted

## 2012-03-06 ENCOUNTER — Other Ambulatory Visit (INDEPENDENT_AMBULATORY_CARE_PROVIDER_SITE_OTHER): Payer: BC Managed Care – PPO

## 2012-03-06 DIAGNOSIS — Z0000 Encounter for general adult medical examination without abnormal findings: Secondary | ICD-10-CM

## 2012-03-06 LAB — BASIC METABOLIC PANEL
BUN: 18 mg/dL (ref 6–23)
CO2: 26 mEq/L (ref 19–32)
Glucose, Bld: 84 mg/dL (ref 70–99)
Potassium: 3.8 mEq/L (ref 3.5–5.1)
Sodium: 140 mEq/L (ref 135–145)

## 2012-03-06 LAB — CBC WITH DIFFERENTIAL/PLATELET
Basophils Relative: 0.4 % (ref 0.0–3.0)
Eosinophils Absolute: 0.2 10*3/uL (ref 0.0–0.7)
Eosinophils Relative: 3.2 % (ref 0.0–5.0)
Hemoglobin: 12.9 g/dL (ref 12.0–15.0)
Lymphocytes Relative: 33.7 % (ref 12.0–46.0)
MCHC: 33.6 g/dL (ref 30.0–36.0)
MCV: 100.9 fl — ABNORMAL HIGH (ref 78.0–100.0)
Monocytes Absolute: 0.4 10*3/uL (ref 0.1–1.0)
Neutro Abs: 3.4 10*3/uL (ref 1.4–7.7)
Neutrophils Relative %: 56.8 % (ref 43.0–77.0)
RBC: 3.81 Mil/uL — ABNORMAL LOW (ref 3.87–5.11)
WBC: 6.1 10*3/uL (ref 4.5–10.5)

## 2012-03-06 LAB — LIPID PANEL
HDL: 98.9 mg/dL (ref >39.00)
LDL Cholesterol: 60 mg/dL (ref 0–99)
VLDL: 13.2 mg/dL (ref 0.0–40.0)

## 2012-03-06 LAB — HEPATIC FUNCTION PANEL
ALT: 13 U/L (ref 0–35)
Bilirubin, Direct: 0 mg/dL (ref 0.0–0.3)
Total Bilirubin: 0.5 mg/dL (ref 0.3–1.2)

## 2012-03-06 NOTE — Telephone Encounter (Addendum)
Pt has pending cpx for next week please clarify lab orders and Dx.

## 2012-03-06 NOTE — Telephone Encounter (Signed)
Please  schedule fasting Labs : BMET,Lipids, hepatic panel, CBC & dif, TSH. V70.0 

## 2012-03-06 NOTE — Progress Notes (Signed)
Labs only

## 2012-03-10 LAB — VITAMIN D 1,25 DIHYDROXY: Vitamin D2 1, 25 (OH)2: <8 pg/mL

## 2012-03-16 ENCOUNTER — Encounter: Payer: BC Managed Care – PPO | Admitting: Internal Medicine

## 2012-05-13 ENCOUNTER — Encounter: Payer: Self-pay | Admitting: Internal Medicine

## 2012-05-13 ENCOUNTER — Ambulatory Visit (INDEPENDENT_AMBULATORY_CARE_PROVIDER_SITE_OTHER): Payer: BC Managed Care – PPO | Admitting: Internal Medicine

## 2012-05-13 VITALS — BP 110/60 | HR 58 | Temp 97.9°F | Resp 12 | Ht 64.03 in | Wt 117.0 lb

## 2012-05-13 DIAGNOSIS — Z0000 Encounter for general adult medical examination without abnormal findings: Secondary | ICD-10-CM

## 2012-05-13 MED ORDER — ACYCLOVIR 200 MG PO CAPS: 200.0000 mg | ORAL_CAPSULE | Freq: Three times a day (TID) | ORAL | Status: AC

## 2012-05-13 MED ORDER — ZOLPIDEM TARTRATE 5 MG PO TABS: ORAL_TABLET | ORAL | Status: DC

## 2012-05-13 NOTE — Patient Instructions (Addendum)
Plain Mucinex for thick secretions ;force NON dairy fluids . Use a Neti pot daily as needed for sinus congestion; going from open side to congested side . Nasal cleansing in the shower as discussed. Make sure that all residual soap is removed to prevent irritation. Nasonex 1 spray in each nostril twice a day as needed. Use the "crossover" technique as discussed. Plain Allegra 160 daily as needed for itchy eyes & sneezing. As per the Standard of Care we discussed , screening Colonoscopy recommended @ 50 & every 5-10 years thereafter . More frequent monitor would be dictated by family history or findings @ Colonoscopy.  There is a mild reduction in Alkaline Phosphatase.Dietary sources of  Alk Phos include whole grains & nuts. Alk Phos is important for optimal liver & bone health.    To prevent sleep dysfunction follow these instructions for sleep hygiene. Do not read, watch TV, or eat in bed. Do not get into bed until you are ready to turn off the light &  to go to sleep. Do not ingest stimulants ( decongestants, diet pills, nicotine, caffeine) after the evening meal.  If insomnia is a chronic problem; Sleep specialty evaluation is indicated. There are many different causes of disturbed sleep including restless leg syndrome , sleep apnea, et Karie Soda. Chronic use of sleeping pills has a number of adverse effects. These include tolerance or lack of effectiveness; automated  behavior such as driving or eating while actually asleep ; & also potential  for habituation.The exact etiology of insomnia  should be be diagnosed for optimal response and avoidance of these potential adverse effects.   Please try to go on My Chart within the next 24 hours to allow you access to your chart.

## 2012-05-13 NOTE — Progress Notes (Signed)
Subjective:    Patient ID: Adriana Lopez, female    DOB: 01-31-1961, 51 y.o.   MRN: [id number]  HPI  She  is here for a physical; she has no acute health  Issues.      Review of Systems Patient reports no significant  vision/ hearing  changes, adenopathy,fever, weight change,  persistant / recurrent hoarseness , swallowing issues, chest pain,palpitations,edema,persistant /recurrent cough, hemoptysis, dyspnea( rest/ exertional/paroxysmal nocturnal), gastrointestinal bleeding(melena, rectal bleeding), abdominal pain, significant heartburn,  bowel changes,GU symptoms(dysuria, hematuria,pyuria, incontinence), Gyn symptoms(abnormal  bleeding , pain),  syncope, focal weakness, memory loss,numbness & tingling, skin/hair /nail changes,abnormal bruising or bleeding, anxiety,or depression.  In April she noticed that she was somewhat sluggish playing tennis in the cool air. She denies frank dyspnea or wheezing. She has had some extrinsic symptoms related to exposure to her cats. Generic Zyrtec has been of benefit     Objective:   Physical Exam Gen.: Thin but healthy and well-nourished in appearance. Alert, appropriate and cooperative throughout exam. Head: Normocephalic without obvious abnormalities Eyes: No corneal or conjunctival inflammation noted. Pupils equal round reactive to light and accommodation. Fundal exam is benign without hemorrhages, exudate, papilledema. Extraocular motion intact. Vision grossly normal. Ears: External  ear exam reveals no significant lesions or deformities. Canals clear .TMs normal. Hearing is grossly normal bilaterally. Nose: External nasal exam reveals no deformity or inflammation. Nasal mucosa are pink and moist. No lesions or exudates noted.  Mouth: Oral mucosa and oropharynx reveal no lesions or exudates. Teeth in good repair. Neck: No deformities, masses, or tenderness noted. Range of motion & Thyroid normal Lungs: Normal respiratory effort; chest expands  symmetrically. Lungs are clear to auscultation without rales, wheezes, or increased work of breathing. Heart: Normal rate and rhythm. Normal S1 and S2. No gallop, click, or rub. Grade 1/2-1 over 6 systolic murmur . Abdomen: Bowel sounds normal; abdomen soft and nontender. No masses, organomegaly or hernias noted. Genitalia: as per Dr Billy Coast                                                                                 Musculoskeletal/extremities: No deformity or scoliosis noted of  the thoracic or lumbar spine. No clubbing, cyanosis, edema, or deformity noted. Range of motion  normal .Tone & strength  normal.Joints normal. Nail health  good. Minor crepitus of the knees without effusion or deformity Vascular: Carotid, radial artery, dorsalis pedis and  posterior tibial pulses are full and equal. No bruits present. Neurologic: Alert and oriented x3. Deep tendon reflexes symmetrical and normal.          Skin: Intact without suspicious lesions or rashes. Lymph: No cervical, axillary lymphadenopathy present. Psych: Mood and affect are normal. Normally interactive  Assessment & Plan:  #1 comprehensive physical exam; no acute findings #2 see Problem List with Assessments & Recommendations Plan: see Orders

## 2012-08-14 ENCOUNTER — Encounter: Payer: Self-pay | Admitting: Family Medicine

## 2012-08-14 ENCOUNTER — Ambulatory Visit (INDEPENDENT_AMBULATORY_CARE_PROVIDER_SITE_OTHER): Payer: BC Managed Care – PPO | Admitting: Family Medicine

## 2012-08-14 VITALS — BP 107/74 | HR 79 | Temp 98.3°F | Ht 63.75 in | Wt 117.0 lb

## 2012-08-14 DIAGNOSIS — J0110 Acute frontal sinusitis, unspecified: Secondary | ICD-10-CM

## 2012-08-14 MED ORDER — AMOXICILLIN 875 MG PO TABS: 875.0000 mg | ORAL_TABLET | Freq: Two times a day (BID) | ORAL | Status: DC

## 2012-08-14 NOTE — Patient Instructions (Addendum)
This is a sinus infection Start the Amox twice daily- take w/ food Drink plenty of fluids Add Mucinex to thin your congestion REST! Hang in there!!!

## 2012-08-14 NOTE — Progress Notes (Signed)
  Subjective:    Patient ID: Terence Lux, female    DOB: 01/18/1961, 51 y.o.   MRN: [id number]  HPI Sinusitis- sxs started Tuesday night w/ 'crazy sneezing'.  Now working w/ kindergartners.  + nasal congestion, facial pain, no tooth pain.  No ear pain.  No fevers.  Mild cough.  Taking Dayquil w/ mild relief.  Hx of similar.   Review of Systems For ROS see HPI     Objective:   Physical Exam  Vitals reviewed. Constitutional: She appears well-developed and well-nourished. No distress.  HENT:  Head: Normocephalic and atraumatic.  Right Ear: Tympanic membrane normal.  Left Ear: Tympanic membrane normal.  Nose: Mucosal edema and rhinorrhea present. Right sinus exhibits maxillary sinus tenderness and frontal sinus tenderness. Left sinus exhibits maxillary sinus tenderness and frontal sinus tenderness.  Mouth/Throat: Uvula is midline and mucous membranes are normal. Posterior oropharyngeal erythema present. No oropharyngeal exudate.  Eyes: Conjunctivae normal and EOM are normal. Pupils are equal, round, and reactive to light.  Neck: Normal range of motion. Neck supple.  Cardiovascular: Normal rate, regular rhythm and normal heart sounds.   Pulmonary/Chest: Effort normal and breath sounds normal. No respiratory distress. She has no wheezes.  Lymphadenopathy:    She has no cervical adenopathy.          Assessment & Plan:

## 2012-08-16 NOTE — Assessment & Plan Note (Signed)
Pt's sxs and PE consistent w/ infxn.  Start abx.  Reviewed supportive care and red flags that should prompt return.  Pt expressed understanding and is in agreement w/ plan.  

## 2012-08-18 DIAGNOSIS — C4491 Basal cell carcinoma of skin, unspecified: Secondary | ICD-10-CM | POA: Insufficient documentation

## 2012-08-31 ENCOUNTER — Telehealth: Payer: Self-pay | Admitting: Family Medicine

## 2012-08-31 MED ORDER — ZOSTER VACCINE LIVE 19400 UNT/0.65ML ~~LOC~~ SOLR: 0.6500 mL | Freq: Once | SUBCUTANEOUS | Status: DC

## 2012-08-31 NOTE — Telephone Encounter (Signed)
Pt would like zostavax faxed to: [facility] [address]  Ph # [phone]  She has checked w/her insurance co. and this is covered at the pharmacy.

## 2012-08-31 NOTE — Telephone Encounter (Signed)
Zostavax sent to pharmacy 

## 2012-09-10 ENCOUNTER — Other Ambulatory Visit: Payer: Self-pay | Admitting: Obstetrics and Gynecology

## 2012-09-10 DIAGNOSIS — Z1231 Encounter for screening mammogram for malignant neoplasm of breast: Secondary | ICD-10-CM

## 2012-10-13 ENCOUNTER — Encounter: Payer: Self-pay | Admitting: Family Medicine

## 2012-10-13 ENCOUNTER — Ambulatory Visit (INDEPENDENT_AMBULATORY_CARE_PROVIDER_SITE_OTHER): Payer: BC Managed Care – PPO | Admitting: Family Medicine

## 2012-10-13 ENCOUNTER — Ambulatory Visit (HOSPITAL_BASED_OUTPATIENT_CLINIC_OR_DEPARTMENT_OTHER)
Admission: RE | Admit: 2012-10-13 | Discharge: 2012-10-13 | Disposition: A | Discharge status: Home / Self Care | Payer: BC Managed Care – PPO | Source: Ambulatory Visit | Attending: Family Medicine | Admitting: Family Medicine

## 2012-10-13 VITALS — BP 108/72 | HR 69 | Temp 98.8°F | Wt 115.0 lb

## 2012-10-13 DIAGNOSIS — R059 Cough, unspecified: Secondary | ICD-10-CM | POA: Insufficient documentation

## 2012-10-13 DIAGNOSIS — R918 Other nonspecific abnormal finding of lung field: Secondary | ICD-10-CM | POA: Insufficient documentation

## 2012-10-13 DIAGNOSIS — R05 Cough: Secondary | ICD-10-CM | POA: Insufficient documentation

## 2012-10-13 DIAGNOSIS — J40 Bronchitis, not specified as acute or chronic: Secondary | ICD-10-CM

## 2012-10-13 MED ORDER — GUAIFENESIN-CODEINE 100-10 MG/5ML PO SYRP: ORAL_SOLUTION | ORAL | Status: DC

## 2012-10-13 MED ORDER — MOXIFLOXACIN HCL 400 MG PO TABS: 400.0000 mg | ORAL_TABLET | Freq: Every day | ORAL | Status: DC

## 2012-10-13 NOTE — Progress Notes (Signed)
  Subjective:     Adriana Lopez is a 51 y.o. female here for evaluation of a cough. Onset of symptoms was 3 weeks ago. Symptoms have been gradually worsening since that time. The cough is productive and is aggravated by cold air, infection and reclining position. Associated symptoms include: chills, shortness of breath, sputum production and wheezing. Patient does not have a history of asthma. Patient does have a history of environmental allergens. Patient has not traveled recently. Patient does not have a history of smoking. Patient has not had a previous chest x-ray. Patient has not had a PPD done.  The following portions of the patient's history were reviewed and updated as appropriate: allergies, current medications, past family history, past medical history, past social history, past surgical history and problem list.  Review of Systems Pertinent items are noted in HPI.    Objective:    Oxygen saturation 96% on room air BP 108/72  Pulse 69  Temp 98.8 F (37.1 C) (Oral)  Wt 115 lb (52.164 kg)  SpO2 96%  LMP 09/02/2011 General appearance: alert, cooperative, appears stated age and no distress Ears: normal TM's and external ear canals both ears Nose: Nares normal. Septum midline. Mucosa normal. No drainage or sinus tenderness. Throat: lips, mucosa, and tongue normal; teeth and gums normal Neck: no adenopathy, supple, symmetrical, trachea midline and thyroid not enlarged, symmetric, no tenderness/mass/nodules Lungs: rhonchi bilaterally and wheezes bilaterally Heart: S1, S2 normal    Assessment:    Acute Bronchitis --r/o pneumonia   Plan:    Antibiotics per medication orders. Antitussives per medication orders. Avoid exposure to tobacco smoke and fumes. Call if shortness of breath worsens, blood in sputum, change in character of cough, development of fever or chills, inability to maintain nutrition and hydration. Avoid exposure to tobacco smoke and fumes. Chest x-ray. Trial  of steroid nasal spray. cont antihistamine

## 2012-10-13 NOTE — Patient Instructions (Addendum)

## 2012-10-29 ENCOUNTER — Encounter: Payer: Self-pay | Admitting: Family Medicine

## 2012-10-29 ENCOUNTER — Ambulatory Visit (INDEPENDENT_AMBULATORY_CARE_PROVIDER_SITE_OTHER): Payer: BC Managed Care – PPO | Admitting: Family Medicine

## 2012-10-29 VITALS — BP 120/70 | HR 64 | Temp 98.0°F | Ht 63.25 in | Wt 116.6 lb

## 2012-10-29 DIAGNOSIS — J159 Unspecified bacterial pneumonia: Secondary | ICD-10-CM | POA: Insufficient documentation

## 2012-10-29 NOTE — Progress Notes (Signed)
  Subjective:    Patient ID: Adriana Lopez, female    DOB: 1961/01/05, 52 y.o.   MRN: [id number]  HPI PNA f/u- pt reports she was sick for 6 weeks prior to coming in and being dx'd w/ PNA on 12/18.  Was on Avelox w/out difficulty.  Used codeine cough syrup for sleep- has not required this in 'days'.  Still has sensation of fluid in L ear.  Cough is 'just about gone'.  + fatigue, chest tightness.    Review of Systems For ROS see HPI s    Objective:   Physical Exam  Vitals reviewed. Constitutional: She appears well-developed and well-nourished. No distress.  HENT:  Head: Normocephalic and atraumatic.       TMs normal bilaterally Mild nasal congestion Throat w/out erythema, edema, or exudate  Eyes: Conjunctivae normal and EOM are normal. Pupils are equal, round, and reactive to light.  Neck: Normal range of motion. Neck supple.  Cardiovascular: Normal rate, regular rhythm, normal heart sounds and intact distal pulses.   No murmur heard. Pulmonary/Chest: Effort normal and breath sounds normal. No respiratory distress. She has no wheezes.       + dry cough  Lymphadenopathy:    She has no cervical adenopathy.          Assessment & Plan:

## 2012-10-29 NOTE — Patient Instructions (Addendum)
It is perfectly normal to be completely wiped out after being this sick Ease back into your routine- this will take time! Call with any questions or concerns Hang in there! Happy New Year!!!

## 2012-11-01 NOTE — Assessment & Plan Note (Signed)
New.  Pt is recovering well from recent dx.  Reviewed that it will take time for her to regain her usual energy level.  Encouraged her to ease into things.  Reviewed supportive care and red flags that should prompt return.  Pt expressed understanding and is in agreement w/ plan.

## 2012-11-17 ENCOUNTER — Ambulatory Visit
Admission: RE | Admit: 2012-11-17 | Discharge: 2012-11-17 | Disposition: A | Discharge status: Home / Self Care | Payer: BC Managed Care – PPO | Source: Ambulatory Visit | Attending: Obstetrics and Gynecology | Admitting: Obstetrics and Gynecology

## 2012-11-17 DIAGNOSIS — Z1231 Encounter for screening mammogram for malignant neoplasm of breast: Secondary | ICD-10-CM

## 2013-06-11 ENCOUNTER — Encounter: Payer: Self-pay | Admitting: Family Medicine

## 2013-06-11 ENCOUNTER — Ambulatory Visit (INDEPENDENT_AMBULATORY_CARE_PROVIDER_SITE_OTHER): Payer: BC Managed Care – PPO | Admitting: Family Medicine

## 2013-06-11 VITALS — BP 110/70 | HR 65 | Temp 98.1°F | Ht 63.25 in | Wt 114.0 lb

## 2013-06-11 DIAGNOSIS — L309 Dermatitis, unspecified: Secondary | ICD-10-CM | POA: Insufficient documentation

## 2013-06-11 DIAGNOSIS — L259 Unspecified contact dermatitis, unspecified cause: Secondary | ICD-10-CM

## 2013-06-11 MED ORDER — ZOLPIDEM TARTRATE 5 MG PO TABS: ORAL_TABLET | ORAL | Status: DC

## 2013-06-11 NOTE — Patient Instructions (Addendum)
This appears to be eczema Avoid steroid creams in this area Apply this layer of vasoline or other thick moisturizer Continue the daily allergy medicine Call with any questions or concerns Hang in there!!!

## 2013-06-11 NOTE — Progress Notes (Signed)
  Subjective:    Patient ID: Adriana Lopez, female    DOB: 02-02-1961, 52 y.o.   MRN: [id number]  HPI Bumps under L eye- will come and go, only occuring under L eye.  Not painful, doesn't itch.  Present all summer.  No change in facial products.  Is allergic to cat and will sleep w/ it regularly   Review of Systems For ROS see HPI     Objective:   Physical Exam  Vitals reviewed. Constitutional: She appears well-developed. No distress.  HENT:  Head: Normocephalic and atraumatic.  Skin: Skin is warm and dry. There is erythema (mild in dry patches under L eye consistent w/ eczema).          Assessment & Plan:

## 2013-06-11 NOTE — Assessment & Plan Note (Signed)
New.  Due to sensitive location will not start steroid cream.  Recommended allergen avoidance and moisturizing as needed.

## 2013-06-21 ENCOUNTER — Telehealth: Payer: Self-pay | Admitting: Family Medicine

## 2013-06-21 MED ORDER — ZOSTER VACCINE LIVE 19400 UNT/0.65ML ~~LOC~~ SOLR: 0.6500 mL | Freq: Once | SUBCUTANEOUS | Status: DC

## 2013-06-21 NOTE — Telephone Encounter (Signed)
Pt called and stated that her insurance no longer cover the vaccine at [facility]. [facility] [facility]. Rx sent. SW, CMA

## 2013-06-21 NOTE — Telephone Encounter (Signed)
Patient states hat due to her insurance she can no longer get her shingles vaccine at [facility]. [facility] [address] for her shingles vaccine.

## 2013-06-29 ENCOUNTER — Encounter: Payer: Self-pay | Admitting: Lab

## 2013-06-30 ENCOUNTER — Encounter: Payer: Self-pay | Admitting: Family Medicine

## 2013-06-30 ENCOUNTER — Ambulatory Visit (INDEPENDENT_AMBULATORY_CARE_PROVIDER_SITE_OTHER): Payer: BC Managed Care – PPO | Admitting: Family Medicine

## 2013-06-30 VITALS — BP 110/78 | HR 78 | Temp 99.2°F | Wt 116.2 lb

## 2013-06-30 DIAGNOSIS — J0100 Acute maxillary sinusitis, unspecified: Secondary | ICD-10-CM

## 2013-06-30 MED ORDER — AMOXICILLIN 875 MG PO TABS: 875.0000 mg | ORAL_TABLET | Freq: Two times a day (BID) | ORAL | Status: DC

## 2013-06-30 NOTE — Patient Instructions (Addendum)
This is a sinus infection Start the Amox twice daily- take w/ food Drink plenty of fluids Mucinex DM for cough and congestion REST! Hang in there!!!

## 2013-06-30 NOTE — Progress Notes (Signed)
  Subjective:    Patient ID: Adriana Lopez, female    DOB: 12/29/1960, 52 y.o.   MRN: [id number]  HPI URI- pt started back to school 1 week ago, now w/ nasal congestion, sinus pressure/pain, HA.  + low grade temps.  Laryngitis, dry cough.  Bilateral ear fullness but no pain.  No N/V, mild loose stools.  No sore throat.   Review of Systems For ROS see HPI     Objective:   Physical Exam  Vitals reviewed. Constitutional: She appears well-developed and well-nourished. No distress.  HENT:  Head: Normocephalic and atraumatic.  Right Ear: Tympanic membrane normal.  Left Ear: Tympanic membrane normal.  Nose: Mucosal edema and rhinorrhea present. Right sinus exhibits maxillary sinus tenderness. Right sinus exhibits no frontal sinus tenderness. Left sinus exhibits maxillary sinus tenderness. Left sinus exhibits no frontal sinus tenderness.  Mouth/Throat: Uvula is midline and mucous membranes are normal. Posterior oropharyngeal erythema present. No oropharyngeal exudate.  Eyes: Conjunctivae and EOM are normal. Pupils are equal, round, and reactive to light.  Neck: Normal range of motion. Neck supple.  Cardiovascular: Normal rate, regular rhythm and normal heart sounds.   Pulmonary/Chest: Effort normal and breath sounds normal. No respiratory distress. She has no wheezes.  Lymphadenopathy:    She has no cervical adenopathy.          Assessment & Plan:

## 2013-06-30 NOTE — Assessment & Plan Note (Signed)
Pt's sxs and PE consistent w/ infxn.  Start abx.  Reviewed supportive care and red flags that should prompt return.  Pt expressed understanding and is in agreement w/ plan.  

## 2013-08-28 LAB — HM PAP SMEAR: HM PAP: NORMAL

## 2013-09-07 ENCOUNTER — Ambulatory Visit (INDEPENDENT_AMBULATORY_CARE_PROVIDER_SITE_OTHER): Payer: BC Managed Care – PPO | Admitting: Family Medicine

## 2013-09-07 ENCOUNTER — Encounter: Payer: Self-pay | Admitting: Family Medicine

## 2013-09-07 VITALS — BP 120/70 | HR 94 | Temp 98.9°F | Resp 16 | Wt 114.5 lb

## 2013-09-07 DIAGNOSIS — A088 Other specified intestinal infections: Secondary | ICD-10-CM

## 2013-09-07 DIAGNOSIS — A084 Viral intestinal infection, unspecified: Secondary | ICD-10-CM | POA: Insufficient documentation

## 2013-09-07 DIAGNOSIS — M545 Low back pain, unspecified: Secondary | ICD-10-CM | POA: Insufficient documentation

## 2013-09-07 MED ORDER — CYCLOBENZAPRINE HCL 10 MG PO TABS: 10.0000 mg | ORAL_TABLET | Freq: Three times a day (TID) | ORAL | Status: DC | PRN

## 2013-09-07 MED ORDER — NAPROXEN 500 MG PO TABS: 500.0000 mg | ORAL_TABLET | Freq: Two times a day (BID) | ORAL | Status: DC

## 2013-09-07 NOTE — Assessment & Plan Note (Signed)
New.  Pt's sxs are already improving.  No need for abx.  Start immodium prn.  Increase fluid intake.  Reviewed supportive care and red flags that should prompt return.  Pt expressed understanding and is in agreement w/ plan.

## 2013-09-07 NOTE — Progress Notes (Signed)
  Subjective:    Patient ID: Adriana Lopez, female    DOB: 02-17-1961, 52 y.o.   MRN: [id number]  HPI Diarrhea/vomiting- 'i just don't feel well'.  Feels that GI virus is 'done'  GI sxs started yesterday but resolved quickly.  Son w/ similar sxs.  + chills and sweats, no documented fever.  No abdominal pain.  LBP- sxs started ~2 weeks ago.  Will wake pt from sleep.  Husband feels they need a new mattress.  No heavy lifting or increased activity level.  Not taking ibuprofen, using heat.  No radiation of pain.  No dysuria.   Review of Systems For ROS see HPI     Objective:   Physical Exam  Vitals reviewed. Constitutional: She is oriented to person, place, and time. She appears well-developed and well-nourished. No distress.  HENT:  Head: Normocephalic and atraumatic.  MMM  Neck: Neck supple.  Cardiovascular: Normal rate, regular rhythm and intact distal pulses.   Pulmonary/Chest: Effort normal and breath sounds normal. No respiratory distress. She has no wheezes. She has no rales.  Abdominal: Soft. She exhibits no distension. There is no tenderness. There is no rebound.  Hyperactive BS  Musculoskeletal: Normal range of motion. She exhibits no edema and no tenderness.  (-) SLR bilaterally  Lymphadenopathy:    She has no cervical adenopathy.  Neurological: She is alert and oriented to person, place, and time. She has normal reflexes.  Skin: Skin is warm and dry.          Assessment & Plan:

## 2013-09-07 NOTE — Patient Instructions (Signed)
Follow up as needed Drink plenty of fluids and take immodium as needed for the GI bug REST! Start the Naproxen twice daily- take w/ food Use the Flexeril as needed for spasm- will cause drowsiness HEAT! Call with any questions or concerns Hang in there!!!

## 2013-09-07 NOTE — Assessment & Plan Note (Signed)
New.  No red flags on hx or PE.  Start scheduled NSAIDs, flexeril prn.  Reviewed supportive care and red flags that should prompt return.  Pt expressed understanding and is in agreement w/ plan.

## 2013-09-10 ENCOUNTER — Telehealth: Payer: Self-pay | Admitting: *Deleted

## 2013-09-10 MED ORDER — AZITHROMYCIN 250 MG PO TABS: ORAL_TABLET | ORAL | Status: DC

## 2013-09-10 NOTE — Telephone Encounter (Signed)
Patient states that she was just seen in the office in the couple of days and states that she has come down with a sinus infection. Patient states that she cannot keep taking of work and would like to know if she can have something called in to the pharmacy. Called patient and left message on her voicemail to return the call to office to let us know what symptoms she is having.

## 2013-09-10 NOTE — Telephone Encounter (Signed)
Patient states that when she came in on Wednesday and stated that she believe she had a cold. But today she is having some facial pressure with headaches, sneezing, sore throat and congestion. Patient made it very clear to me that she cannot keeping taking off work to be seen. Would like to know if there is any way that the provider could call something in to the pharmacy. Please advise. SW

## 2013-09-10 NOTE — Telephone Encounter (Signed)
Detailed message left on patient voicemail. Sw

## 2013-09-10 NOTE — Telephone Encounter (Signed)
Ok for Zpack- 250mg  tabs, 2 on day 1, 1 on days 2-5, #6

## 2013-10-28 LAB — HM MAMMOGRAPHY: HM Mammogram: NORMAL

## 2013-10-29 ENCOUNTER — Other Ambulatory Visit: Payer: Self-pay

## 2013-10-29 DIAGNOSIS — Z1231 Encounter for screening mammogram for malignant neoplasm of breast: Secondary | ICD-10-CM

## 2013-11-22 ENCOUNTER — Ambulatory Visit
Admission: RE | Admit: 2013-11-22 | Discharge: 2013-11-22 | Disposition: A | Discharge status: Home / Self Care | Payer: BC Managed Care – PPO | Source: Ambulatory Visit

## 2013-11-22 DIAGNOSIS — Z1231 Encounter for screening mammogram for malignant neoplasm of breast: Secondary | ICD-10-CM

## 2013-11-23 ENCOUNTER — Other Ambulatory Visit: Payer: Self-pay | Admitting: Obstetrics and Gynecology

## 2013-11-23 DIAGNOSIS — N6310 Unspecified lump in the right breast, unspecified quadrant: Secondary | ICD-10-CM

## 2013-11-26 ENCOUNTER — Ambulatory Visit
Admission: RE | Admit: 2013-11-26 | Discharge: 2013-11-26 | Disposition: A | Discharge status: Home / Self Care | Payer: BC Managed Care – PPO | Source: Ambulatory Visit | Attending: Obstetrics and Gynecology | Admitting: Obstetrics and Gynecology

## 2013-11-26 DIAGNOSIS — N6310 Unspecified lump in the right breast, unspecified quadrant: Secondary | ICD-10-CM

## 2013-12-06 ENCOUNTER — Other Ambulatory Visit: Payer: Self-pay | Admitting: Internal Medicine

## 2013-12-07 ENCOUNTER — Other Ambulatory Visit: Payer: Self-pay | Admitting: Family Medicine

## 2013-12-07 NOTE — Telephone Encounter (Signed)
She is seeing Adriana Lopez

## 2013-12-07 NOTE — Telephone Encounter (Signed)
Requesting Acyclovir 200mg  Take 1 capsule by mouth three times a day. Last OV:09-07-13 with Dr. Birdie Riddle Last refill:did not see when it was filled last.\ Please advise.//AB/CMA

## 2013-12-07 NOTE — Telephone Encounter (Signed)
Med filled.  

## 2013-12-09 ENCOUNTER — Other Ambulatory Visit: Payer: Self-pay | Admitting: Family Medicine

## 2013-12-10 NOTE — Telephone Encounter (Signed)
Med filled and faxed.  

## 2013-12-10 NOTE — Telephone Encounter (Signed)
Last OV 09-07-13 Med filled 06-11-13 #10 with 2

## 2013-12-27 ENCOUNTER — Encounter: Payer: Self-pay | Admitting: Family Medicine

## 2013-12-27 ENCOUNTER — Ambulatory Visit (INDEPENDENT_AMBULATORY_CARE_PROVIDER_SITE_OTHER): Payer: BC Managed Care – PPO | Admitting: Family Medicine

## 2013-12-27 VITALS — BP 118/78 | HR 74 | Temp 98.2°F | Resp 16 | Ht 64.25 in | Wt 118.0 lb

## 2013-12-27 DIAGNOSIS — J0110 Acute frontal sinusitis, unspecified: Secondary | ICD-10-CM

## 2013-12-27 MED ORDER — AMOXICILLIN 875 MG PO TABS: 875.0000 mg | ORAL_TABLET | Freq: Two times a day (BID) | ORAL | Status: DC

## 2013-12-27 NOTE — Progress Notes (Signed)
   Subjective:    Patient ID: Adriana Lopez, female    DOB: 01-15-1961, 53 y.o.   MRN: [id number]  Sinusitis   URI- sxs started Saturday w/ sneezing.  Yesterday developed sore throat, nasal congestion, sinus pressure.  No fevers.  + hoarseness.  No relief w/ allergy meds.  Dry cough started today.  No known sick contacts.  No N/V/D.   Review of Systems For ROS see HPI     Objective:   Physical Exam  Constitutional: She appears well-developed and well-nourished. No distress.  HENT:  Head: Normocephalic and atraumatic.  Right Ear: Tympanic membrane normal.  Left Ear: Tympanic membrane normal.  Nose: Mucosal edema and rhinorrhea present. Right sinus exhibits frontal sinus tenderness. Right sinus exhibits no maxillary sinus tenderness. Left sinus exhibits frontal sinus tenderness. Left sinus exhibits no maxillary sinus tenderness.  Mouth/Throat: Uvula is midline and mucous membranes are normal. Posterior oropharyngeal erythema present. No oropharyngeal exudate.  Eyes: Conjunctivae and EOM are normal. Pupils are equal, round, and reactive to light.  Neck: Normal range of motion. Neck supple.  Cardiovascular: Normal rate, regular rhythm and normal heart sounds.   Pulmonary/Chest: Effort normal and breath sounds normal. No respiratory distress. She has no wheezes.  Lymphadenopathy:    She has no cervical adenopathy.          Assessment & Plan:

## 2013-12-27 NOTE — Assessment & Plan Note (Signed)
Pt's sxs and PE consistent w/ infxn.  Start abx.  Reviewed supportive care and red flags that should prompt return.  Pt expressed understanding and is in agreement w/ plan.  

## 2013-12-27 NOTE — Patient Instructions (Signed)
Follow up as needed Start the antibiotics twice daily- take w/ food Drink plenty of fluids Mucinex DM for cough and congestion REST! Hang in there!!!

## 2014-01-27 ENCOUNTER — Other Ambulatory Visit: Payer: Self-pay | Admitting: Family Medicine

## 2014-01-27 NOTE — Telephone Encounter (Signed)
Med filled.  

## 2014-03-22 ENCOUNTER — Ambulatory Visit (INDEPENDENT_AMBULATORY_CARE_PROVIDER_SITE_OTHER): Payer: BC Managed Care – PPO | Admitting: Family Medicine

## 2014-03-22 ENCOUNTER — Encounter: Payer: Self-pay | Admitting: Family Medicine

## 2014-03-22 VITALS — BP 102/66 | HR 68 | Temp 98.7°F | Wt 115.0 lb

## 2014-03-22 DIAGNOSIS — J019 Acute sinusitis, unspecified: Secondary | ICD-10-CM

## 2014-03-22 DIAGNOSIS — F411 Generalized anxiety disorder: Secondary | ICD-10-CM

## 2014-03-22 MED ORDER — CITALOPRAM HYDROBROMIDE 20 MG PO TABS: ORAL_TABLET | ORAL | Status: DC

## 2014-03-22 MED ORDER — AMOXICILLIN-POT CLAVULANATE 875-125 MG PO TABS: 1.0000 | ORAL_TABLET | Freq: Two times a day (BID) | ORAL | Status: DC

## 2014-03-22 MED ORDER — TRIAMCINOLONE ACETONIDE 55 MCG/ACT NA AERO: 2.0000 | INHALATION_SPRAY | Freq: Every day | NASAL | Status: DC

## 2014-03-22 MED ORDER — ALPRAZOLAM 0.25 MG PO TABS: 0.2500 mg | ORAL_TABLET | Freq: Three times a day (TID) | ORAL | Status: DC | PRN

## 2014-03-22 NOTE — Progress Notes (Signed)
  Subjective:     Adriana Lopez is a 53 y.o. female who presents for evaluation of sinus pain. Symptoms include: congestion, cough, nasal congestion, post nasal drip, purulent rhinorrhea, sinus pressure and sore throat. Onset of symptoms was 5 days ago. Symptoms have been gradually worsening since that time. Past history is significant for no history of pneumonia or bronchitis. Patient is a non-smoker.  The following portions of the patient's history were reviewed and updated as appropriate: allergies, current medications, past family history, past medical history, past social history, past surgical history and problem list.  Review of Systems Pertinent items are noted in HPI.   Objective:    BP 102/66  Pulse 68  Temp(Src) 98.7 F (37.1 C) (Oral)  Wt 115 lb (52.164 kg)  SpO2 99% General appearance: alert, cooperative, appears stated age and no distress Ears: normal TM's and external ear canals both ears Nose: green discharge, moderate congestion, turbinates red, swollen, sinus tenderness bilateral Throat: lips, mucosa, and tongue normal; teeth and gums normal Neck: moderate anterior cervical adenopathy, supple, symmetrical, trachea midline and thyroid not enlarged, symmetric, no tenderness/mass/nodules Lungs: clear to auscultation bilaterally Heart: S1, S2 normal   psych-- pt is very anxious, teary eyed Assessment:    Acute bacterial sinusitis.    Plan:    Nasal steroids per medication orders. Antihistamines per medication orders. Augmentin per medication orders.    1. Sinusitis, acute con't antihistamine - amoxicillin-clavulanate (AUGMENTIN) 875-125 MG per tablet; Take 1 tablet by mouth 2 (two) times daily.  Dispense: 20 tablet; Refill: 0 - triamcinolone (NASACORT AQ) 55 MCG/ACT AERO nasal inhaler; Place 2 sprays into the nose daily.  Dispense: 1 Inhaler; Refill: 12  2. Generalized anxiety disorder F/u 1 month or sooner prn - citalopram (CELEXA) 20 MG tablet; 1/2 tab  po qd x 1 week then 1 po qd  Dispense: 30 tablet; Refill: 3 - ALPRAZolam (XANAX) 0.25 MG tablet; Take 1 tablet (0.25 mg total) by mouth 3 (three) times daily as needed for anxiety.  Dispense: 30 tablet; Refill: 0

## 2014-03-22 NOTE — Patient Instructions (Signed)

## 2014-04-11 ENCOUNTER — Telehealth: Payer: Self-pay | Admitting: Family Medicine

## 2014-04-11 MED ORDER — FLUCONAZOLE 150 MG PO TABS: ORAL_TABLET | ORAL | Status: DC

## 2014-04-11 NOTE — Telephone Encounter (Signed)
Diflucan 150 mg #2  1 po qd x 1, may repeat in 3 days prn  

## 2014-04-11 NOTE — Telephone Encounter (Signed)
Rx sent and the patient has been made aware     KP 

## 2014-04-11 NOTE — Telephone Encounter (Signed)
Caller name: Maryell Esses Relation to pt: patient  Call back number: [phone] Pharmacy: [facility]   Reason for call: patient took her antibiotics from visit 03/22/2014 Etter Sjogren) and now feels like she may have a yeast infection. Patient would like something called in for a yeast infection. Please advise.

## 2014-04-11 NOTE — Telephone Encounter (Signed)
Please advise      KP 

## 2014-04-25 ENCOUNTER — Telehealth: Payer: Self-pay | Admitting: Family Medicine

## 2014-04-25 MED ORDER — FLUCONAZOLE 150 MG PO TABS: ORAL_TABLET | ORAL | Status: DC

## 2014-04-25 NOTE — Telephone Encounter (Signed)
Pt seen Lowne on 5/26. Please advise.

## 2014-04-25 NOTE — Telephone Encounter (Signed)
Diflucan 150 mg #2  1 po qd , may repeat in 3 days prn  

## 2014-04-25 NOTE — Telephone Encounter (Signed)
Rx faxed and a detailed message left making the patient aware.     KP

## 2014-04-25 NOTE — Telephone Encounter (Signed)
Caller name: [name]  Call back [place]  Reason for call:  Pt thinks she has a yeast infection from the antibiotics.  Pt wants to know if she can get something.  Please advise.

## 2014-06-15 ENCOUNTER — Ambulatory Visit: Payer: BC Managed Care – PPO | Admitting: Family Medicine

## 2014-06-21 ENCOUNTER — Telehealth: Payer: Self-pay | Admitting: Family Medicine

## 2014-06-21 NOTE — Telephone Encounter (Signed)
Caller name: [name]  Call back [phone]   Reason for call: pt had a blood vessel in her eye burst, and it looks nasty; not painful.  Wants to know what she can do for it; cold compress, eye drops.

## 2014-06-21 NOTE — Telephone Encounter (Signed)
LM for patient to return the call. If symptoms as follows seek medical treatment from an eye care professional.  pain associated with the hemorrhage, changes in vision (for example, blurry vision, double vision, difficulty seeing),  history of a bleeding disorder, history of high blood pressure, or injury from trauma to the eye

## 2014-06-22 NOTE — Telephone Encounter (Signed)
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

## 2014-06-22 NOTE — Telephone Encounter (Signed)
Left detailed message on patient's voice mail.

## 2014-06-30 ENCOUNTER — Encounter: Payer: Self-pay | Admitting: Family Medicine

## 2014-06-30 ENCOUNTER — Ambulatory Visit (INDEPENDENT_AMBULATORY_CARE_PROVIDER_SITE_OTHER): Payer: BC Managed Care – PPO | Admitting: Family Medicine

## 2014-06-30 VITALS — BP 122/82 | HR 69 | Resp 16 | Wt 119.5 lb

## 2014-06-30 DIAGNOSIS — F411 Generalized anxiety disorder: Secondary | ICD-10-CM

## 2014-06-30 NOTE — Assessment & Plan Note (Signed)
New to provider.  Pt started meds ~1 month ago and has noted dramatic improvement.  No med changes at this time.  Will continue to follow.

## 2014-06-30 NOTE — Progress Notes (Signed)
   Subjective:    Patient ID: Adriana Lopez, female    DOB: 08/24/1961, 53 y.o.   MRN: [id number]  HPI Anxiety- pt saw Dr Etter Sjogren in May for a sinus infxn and was started on Lexapro.  Pt did not start meds until end of July.  Initially had 'a lot of sweating' and difficulty sleeping but this has improved.  Pt reports mood has dramatically improved.  Previously pt was having angry outbursts, crying- and is no longer doing this.  'it's amazing how different i feel'.   Review of Systems For ROS see HPI     Objective:   Physical Exam  Vitals reviewed. Constitutional: She is oriented to person, place, and time. She appears well-developed and well-nourished. No distress.  HENT:  Head: Normocephalic and atraumatic.  Cardiovascular: Normal rate, regular rhythm, normal heart sounds and intact distal pulses.   Pulmonary/Chest: Effort normal and breath sounds normal. No respiratory distress. She has no wheezes. She has no rales.  Neurological: She is alert and oriented to person, place, and time.  Skin: Skin is warm and dry.  Psychiatric: She has a normal mood and affect. Her behavior is normal. Thought content normal.          Assessment & Plan:

## 2014-06-30 NOTE — Progress Notes (Signed)
Pre visit review using our clinic review tool, if applicable. No additional management support is needed unless otherwise documented below in the visit note. 

## 2014-06-30 NOTE — Patient Instructions (Signed)
Schedule your complete physical in 3-4 months Continue the medication daily Call with any questions or concerns Hang in there!  You can do this!

## 2014-08-15 ENCOUNTER — Ambulatory Visit (INDEPENDENT_AMBULATORY_CARE_PROVIDER_SITE_OTHER): Payer: BC Managed Care – PPO | Admitting: Family Medicine

## 2014-08-15 ENCOUNTER — Encounter: Payer: Self-pay | Admitting: Family Medicine

## 2014-08-15 VITALS — BP 120/84 | HR 64 | Temp 98.1°F | Resp 16 | Wt 119.2 lb

## 2014-08-15 DIAGNOSIS — J0101 Acute recurrent maxillary sinusitis: Secondary | ICD-10-CM

## 2014-08-15 DIAGNOSIS — F411 Generalized anxiety disorder: Secondary | ICD-10-CM

## 2014-08-15 MED ORDER — FLUCONAZOLE 150 MG PO TABS: ORAL_TABLET | ORAL | Status: DC

## 2014-08-15 MED ORDER — ZOLPIDEM TARTRATE 5 MG PO TABS: ORAL_TABLET | ORAL | Status: DC

## 2014-08-15 MED ORDER — CITALOPRAM HYDROBROMIDE 20 MG PO TABS: ORAL_TABLET | ORAL | Status: DC

## 2014-08-15 MED ORDER — AMOXICILLIN 875 MG PO TABS: 875.0000 mg | ORAL_TABLET | Freq: Two times a day (BID) | ORAL | Status: AC

## 2014-08-15 NOTE — Assessment & Plan Note (Signed)
Pt's sxs and PE consistent w/ infxn.  Start abx.  Diflucan as needed due to abx use.  Reviewed supportive care and red flags that should prompt return.  Pt expressed understanding and is in agreement w/ plan.

## 2014-08-15 NOTE — Assessment & Plan Note (Signed)
Refill provided for Ambien.

## 2014-08-15 NOTE — Progress Notes (Signed)
   Subjective:    Patient ID: Adriana Lopez, female    DOB: 12-12-1960, 53 y.o.   MRN: [id number]  Sinusitis Associated symptoms include coughing and headaches.  Cough Associated symptoms include headaches.  Headache  Associated symptoms include coughing.  Sore Throat  Associated symptoms include coughing and headaches.   URI- sxs started yesterday w/ sore throat, nasal congestion, hoarseness, PND.  Woke early this AM w/ facial pain/pressure.  No fevers.  No N/V/D.  + sick contacts.  Minimal cough.  Hx of similar.   Review of Systems  Respiratory: Positive for cough.   Neurological: Positive for headaches.       Objective:   Physical Exam  Vitals reviewed. Constitutional: She appears well-developed and well-nourished. No distress.  HENT:  Head: Normocephalic and atraumatic.  Right Ear: Tympanic membrane normal.  Left Ear: Tympanic membrane normal.  Nose: Mucosal edema and rhinorrhea present. Right sinus exhibits maxillary sinus tenderness and frontal sinus tenderness. Left sinus exhibits maxillary sinus tenderness and frontal sinus tenderness.  Mouth/Throat: Uvula is midline and mucous membranes are normal. Posterior oropharyngeal erythema present. No oropharyngeal exudate.  Eyes: Conjunctivae and EOM are normal. Pupils are equal, round, and reactive to light.  Neck: Normal range of motion. Neck supple.  Cardiovascular: Normal rate, regular rhythm and normal heart sounds.   Pulmonary/Chest: Effort normal and breath sounds normal. No respiratory distress. She has no wheezes.  Lymphadenopathy:    She has no cervical adenopathy.          Assessment & Plan:

## 2014-08-15 NOTE — Patient Instructions (Signed)
Follow up as needed Start the Amoxicillin twice daily- take w/ food Drink plenty of fluids REST! Call with any questions or concerns Hang in there!!!

## 2014-08-15 NOTE — Progress Notes (Signed)
Pre visit review using our clinic review tool, if applicable. No additional management support is needed unless otherwise documented below in the visit note. 

## 2014-09-28 ENCOUNTER — Other Ambulatory Visit: Payer: Self-pay | Admitting: Family Medicine

## 2014-10-24 ENCOUNTER — Other Ambulatory Visit: Payer: Self-pay | Admitting: Family Medicine

## 2014-10-25 ENCOUNTER — Telehealth: Payer: Self-pay | Admitting: *Deleted

## 2014-10-25 DIAGNOSIS — F411 Generalized anxiety disorder: Secondary | ICD-10-CM

## 2014-10-25 MED ORDER — ALPRAZOLAM 0.25 MG PO TABS
0.2500 mg | ORAL_TABLET | Freq: Three times a day (TID) | ORAL | Status: DC | PRN
Start: 2014-10-25 — End: 2015-05-18

## 2014-10-25 NOTE — Telephone Encounter (Signed)
Rx refill request for Xanax.  Patient last seen 08/15/14.  Last refill 03/22/14 for 30 tabs with no refill.  Rx was for 3 tabs/day PRN.  Please advise    EAL

## 2014-10-25 NOTE — Telephone Encounter (Signed)
Rx faxed.    KP 

## 2014-10-25 NOTE — Telephone Encounter (Signed)
Refill x1 

## 2014-10-26 ENCOUNTER — Encounter: Payer: Self-pay | Admitting: Family Medicine

## 2014-10-31 ENCOUNTER — Other Ambulatory Visit: Payer: Self-pay | Admitting: General Practice

## 2014-10-31 MED ORDER — ZOLPIDEM TARTRATE 5 MG PO TABS: ORAL_TABLET | ORAL | Status: DC

## 2014-10-31 NOTE — Telephone Encounter (Signed)
Last OV 08-15-14 Zolpidem last filled #10 with 3 refills on 08-15-14

## 2014-11-01 NOTE — Telephone Encounter (Signed)
Med filled and faxed.  

## 2014-11-17 ENCOUNTER — Encounter: Payer: Self-pay | Admitting: Family Medicine

## 2014-11-17 ENCOUNTER — Ambulatory Visit (INDEPENDENT_AMBULATORY_CARE_PROVIDER_SITE_OTHER): Payer: BC Managed Care – PPO | Admitting: Family Medicine

## 2014-11-17 VITALS — BP 117/70 | HR 76 | Temp 98.2°F | Resp 16 | Wt 123.1 lb

## 2014-11-17 DIAGNOSIS — M62838 Other muscle spasm: Secondary | ICD-10-CM

## 2014-11-17 DIAGNOSIS — M6248 Contracture of muscle, other site: Secondary | ICD-10-CM

## 2014-11-17 DIAGNOSIS — M25511 Pain in right shoulder: Secondary | ICD-10-CM | POA: Insufficient documentation

## 2014-11-17 MED ORDER — MELOXICAM 15 MG PO TABS: 15.0000 mg | ORAL_TABLET | Freq: Every day | ORAL | Status: DC

## 2014-11-17 MED ORDER — CYCLOBENZAPRINE HCL 10 MG PO TABS: 10.0000 mg | ORAL_TABLET | Freq: Every day | ORAL | Status: DC

## 2014-11-17 NOTE — Progress Notes (Signed)
   Subjective:    Patient ID: Adriana Lopez, female    DOB: July 13, 1961, 54 y.o.   MRN: [id number]  HPI Shoulder pain- months ago woke up on R shoulder and it was painful.  Shoulder pain is now radiating up into neck and back of head.  Occuring daily and causing HAs.  Some temporary improvement w/ Naproxen, no relief w/ Advil.  No known injury.  Using heating pad.   Review of Systems For ROS see HPI     Objective:   Physical Exam  Constitutional: She is oriented to person, place, and time. She appears well-developed and well-nourished. No distress.  Neck: Neck supple.  Cardiovascular: Intact distal pulses.   Musculoskeletal: She exhibits tenderness (over R upper trap w/ spasm). She exhibits no edema.  Full ROM of R shoulder, + impingement signs  Neurological: She is alert and oriented to person, place, and time. She has normal reflexes.  Skin: Skin is warm and dry. No erythema.  Vitals reviewed.         Assessment & Plan:

## 2014-11-17 NOTE — Progress Notes (Signed)
Pre visit review using our clinic review tool, if applicable. No additional management support is needed unless otherwise documented below in the visit note. 

## 2014-11-17 NOTE — Patient Instructions (Signed)
Follow up as needed Start the Mobic daily x10-14 days and then as needed- take w/ food Avoid other NSAIDs (advil, aleve, ibuprofen) You can add tylenol as needed Use the flexeril as needed- will cause drowsiness HEAT! Call with any questions or concerns Hang in there! Stay safe and warm this weekend!

## 2014-11-19 NOTE — Assessment & Plan Note (Signed)
New.  Pt w/ + impingement signs.  Start daily NSAID prn.  If pain worsens or progresses, will refer to Ortho.  Pt expressed understanding and is in agreement w/ plan.

## 2014-11-19 NOTE — Assessment & Plan Note (Signed)
New.  Suspect this is causing pt's HAs.  Start daily NSAID prn.  Flexeril prn.  Reviewed supportive care and red flags that should prompt return.  Pt expressed understanding and is in agreement w/ plan.

## 2015-01-11 ENCOUNTER — Other Ambulatory Visit: Payer: Self-pay | Admitting: Family Medicine

## 2015-01-11 NOTE — Telephone Encounter (Signed)
Med filled.  

## 2015-01-27 ENCOUNTER — Other Ambulatory Visit: Payer: Self-pay | Admitting: Family Medicine

## 2015-01-27 NOTE — Telephone Encounter (Signed)
Med filled.  

## 2015-02-21 ENCOUNTER — Telehealth: Payer: Self-pay | Admitting: Family Medicine

## 2015-02-21 DIAGNOSIS — M25511 Pain in right shoulder: Secondary | ICD-10-CM

## 2015-02-21 NOTE — Telephone Encounter (Signed)
Pt needs to see ortho, not me.  I'll be happy to see her in a 4:00 spot but according to last OV, next step is ortho referral.

## 2015-02-21 NOTE — Telephone Encounter (Signed)
Called pt and LMOVM. Informed on message that Birdie Riddle is ok with seeing her in a 4pm spot, but pt should really consider seeing ortho, since that is the provider's recommendation for the next step. Waiting on pt to inform which option she would prefer.

## 2015-02-21 NOTE — Telephone Encounter (Signed)
Caller name: Delona ellen Relation to pt: self Call back number: [phone] Pharmacy:  Reason for call:   States that she is still having R shoulder pain. [facility] will be giving [place] Xray. She is requesting latest appointment. Is it ok to put patient in at 4pm sometime? She works in [employer], historic buildings. patient refuses to see another provider.

## 2015-02-21 NOTE — Telephone Encounter (Signed)
Ok to use a 4pm?

## 2015-02-22 NOTE — Telephone Encounter (Signed)
Patient called back stating that she would like to proceed with ortho referral but refuses to go to [facility].

## 2015-02-22 NOTE — Telephone Encounter (Signed)
Referral placed.

## 2015-04-17 ENCOUNTER — Telehealth: Payer: Self-pay | Admitting: Family Medicine

## 2015-04-17 ENCOUNTER — Ambulatory Visit (INDEPENDENT_AMBULATORY_CARE_PROVIDER_SITE_OTHER): Payer: BC Managed Care – PPO | Admitting: Medical

## 2015-04-17 ENCOUNTER — Encounter: Payer: Self-pay | Admitting: Medical

## 2015-04-17 VITALS — BP 117/48 | HR 69 | Temp 98.7°F | Ht 64.5 in | Wt 119.2 lb

## 2015-04-17 DIAGNOSIS — W57XXXA Bitten or stung by nonvenomous insect and other nonvenomous arthropods, initial encounter: Secondary | ICD-10-CM | POA: Diagnosis not present

## 2015-04-17 DIAGNOSIS — T148 Other injury of unspecified body region: Secondary | ICD-10-CM

## 2015-04-17 MED ORDER — DOXYCYCLINE HYCLATE 100 MG PO TABS: 100.0000 mg | ORAL_TABLET | Freq: Two times a day (BID) | ORAL | Status: DC

## 2015-04-17 NOTE — Patient Instructions (Signed)
Tick bite Tick bite studies. Rx doxycycline. Advisement on med given. Follow up in 7-10 days or as needed.   Also use probiotics while on antibiotic.

## 2015-04-17 NOTE — Telephone Encounter (Signed)
[facility] [place] [facility] - Client [facility] Patient Name: Adriana Lopez [facility] R DOB: 1961/02/28 Initial Comment caller states she has a red bump where a tick bit her Nurse Assessment Nurse: Mechele Dawley, RN, Amy Date/Time (Eastern Time): 04/17/2015 10:54:48 AM Confirm and document reason for call. If symptomatic, describe symptoms. ---CALLER STATES THAT SHE FELT SOMETHING THAT FELT LIKE IT WAS A SCAB. IT IS ON HER OUTER LEG NOT FAR FROM THE KNEE CAP. THERE IS A BUMP WHERE YOU CAN FEEL AND SOME SLIGHT REDNESS. SHE THINKS SHE REMOVED THE ENTIRE TICK. SHE REMOVED THE TICK A WEEK AGO SATURDAY. SHE JUST NOTICED THE BUMP ABOUT 2 DAYS AGO. NO OTHER SYMPTOMS THAT SHE IS HAVING. Has the patient traveled out of the country within the last 30 days? ---Not Applicable Does the patient require triage? ---Yes Related visit to physician within the last 2 weeks? ---No Does the PT have any chronic conditions? (i.e. diabetes, asthma, etc.) ---No Did the patient indicate they were pregnant? ---No Guidelines Guideline Title Affirmed Question Affirmed Notes Tick Bite [1] Red or very tender (to touch) area AND [2] started over 24 hours after the bite Final Disposition User See Physician within [place], Therapist, sports, [place]

## 2015-04-17 NOTE — Telephone Encounter (Signed)
Pt has an appointment scheduled today (04/17/15) with Mackie Pai, PA at 1:15 pm.

## 2015-04-17 NOTE — Assessment & Plan Note (Signed)
Tick bite studies. Rx doxycycline. Advisement on med given. Follow up in 7-10 days or as needed.

## 2015-04-17 NOTE — Progress Notes (Signed)
Pre visit review using our clinic review tool, if applicable. No additional management support is needed unless otherwise documented below in the visit note. 

## 2015-04-17 NOTE — Addendum Note (Signed)
Addended by: Anabel Halon on: 04/17/2015 01:52 PM   Modules accepted: Orders

## 2015-04-17 NOTE — Progress Notes (Signed)
Subjective:    Patient ID: Adriana Lopez, female    DOB: 07-13-1961, 54 y.o.   MRN: [id number]  HPI  Pt recently took up gardening Then she noticed the other day driving down road and felt small lump on lateral distal thigh. Slight above knee level. She pulled it off. She is not aware how long it was present.  No fevers, no chills, no sweats, no joint pain, no muscle pain or rash. Slight lump where she pulled tick off 9 days ago.  Getting over residual sinus pressure/congestion.  Review of Systems  Constitutional: Negative for fever, chills and fatigue.  Respiratory: Negative for chest tightness, shortness of breath and wheezing.   Cardiovascular: Negative for chest pain and palpitations.  Musculoskeletal: Negative for myalgias and arthralgias.  Skin:       Lt leg tick bite.  Neurological: Negative for dizziness, syncope, weakness, numbness and headaches.  Psychiatric/Behavioral: Negative for behavioral problems and confusion.    Past Medical History  Diagnosis Date  . Allergy     feline  . Anxiety     History   Social History  . Marital Status: Married    Spouse Name: N/A  . Number of Children: N/A  . Years of Education: N/A   Occupational History  . Not on file.   Social History Main Topics  . Smoking status: Never Smoker   . Smokeless tobacco: Not on file  . Alcohol Use: 4.2 oz/week    7 Glasses of wine per week  . Drug Use: No  . Sexual Activity: Not on file   Other Topics Concern  . Not on file   Social History Narrative    Past Surgical History  Procedure Laterality Date  . Tonsillectomy and adenoidectomy    . Refractive surgery  2012    Dr Lucita Ferrara    Family History  Problem Relation Age of Onset  . Heart disease Father     pacer  . Cancer Father     dermatologic  . Cancer Maternal Aunt     uterine  . Diabetes Neg Hx   . Stroke Neg Hx   . Hypertension Neg Hx   . Alzheimer's disease Maternal Aunt   . Alzheimer's disease Mother      No Known Allergies  Current Outpatient Prescriptions on File Prior to Visit  Medication Sig Dispense Refill  . acyclovir (ZOVIRAX) 200 MG capsule TAKE ONE CAPSULE BY MOUTH THREE TIMES DAILY  30 capsule 4  . ALPRAZolam (XANAX) 0.25 MG tablet Take 1 tablet (0.25 mg total) by mouth 3 (three) times daily as needed for anxiety. 30 tablet 0  . cetirizine (ZYRTEC) 10 MG tablet Take 10 mg by mouth daily.    . citalopram (CELEXA) 20 MG tablet TAKE 1/2 TABLET BY MOUTH ONCE A DAY FOR 1 WEEK, THEN 1 TAB ONCE A DAY 30 tablet 3  . cyclobenzaprine (FLEXERIL) 10 MG tablet Take 1 tablet (10 mg total) by mouth at bedtime. 30 tablet 1  . ESTRACE VAGINAL 0.1 MG/GM vaginal cream     . LO LOESTRIN FE 1 MG-10 MCG / 10 MCG tablet     . meloxicam (MOBIC) 15 MG tablet TAKE 1 TABLET (15 MG TOTAL) BY MOUTH DAILY. 30 tablet 1  . Nutritional Supplements (HRT SUPPORT PO) Take by mouth.    . zolpidem (AMBIEN) 5 MG tablet TAKE ONE TABLET BY MOUTH EVERY THREE NIGHTS AS NEEDED 10 tablet 3   No current facility-administered medications on file  prior to visit.    BP 117/48 mmHg  Pulse 69  Temp(Src) 98.7 F (37.1 C) (Oral)  Ht 5' 4.5" (1.638 m)  Wt 119 lb 3.2 oz (54.069 kg)  BMI 20.15 kg/m2  SpO2 100%  LMP  (Exact Date)       Objective:   Physical Exam   General- No acute distress. Pleasant patient. Neck- Full range of motion, no jvd Lungs- Clear, even and unlabored. Heart- regular rate and rhythm. Neurologic- CNII- XII grossly intact. Skin- left lateral distal thigh. Small 8 mm indurated faint pink area. ONnclose inspection no portion of tick seen.     Assessment & Plan:

## 2015-04-19 LAB — ROCKY MTN SPOTTED FVR ABS PNL(IGG+IGM)
RMSF IGG: 0.12 IV
RMSF IgM: 0.58 IV

## 2015-04-21 LAB — LYME ABY, WSTRN BLT IGG & IGM W/BANDS
B BURGDORFERI IGG ABS (IB): NEGATIVE
B burgdorferi IgM Abs (IB): NEGATIVE
LYME DISEASE 39 KD IGM: NONREACTIVE
LYME DISEASE 41 KD IGM: NONREACTIVE
LYME DISEASE 45 KD IGG: NONREACTIVE
LYME DISEASE 58 KD IGG: NONREACTIVE
LYME DISEASE 93 KD IGG: NONREACTIVE
Lyme Disease 18 kD IgG: NONREACTIVE
Lyme Disease 23 kD IgG: NONREACTIVE
Lyme Disease 23 kD IgM: NONREACTIVE
Lyme Disease 28 kD IgG: NONREACTIVE
Lyme Disease 30 kD IgG: NONREACTIVE
Lyme Disease 39 kD IgG: NONREACTIVE
Lyme Disease 41 kD IgG: NONREACTIVE
Lyme Disease 66 kD IgG: NONREACTIVE

## 2015-05-15 ENCOUNTER — Telehealth: Payer: Self-pay | Admitting: Family Medicine

## 2015-05-15 NOTE — Telephone Encounter (Signed)
pre visit letter mailed 05/11/15

## 2015-05-18 ENCOUNTER — Other Ambulatory Visit: Payer: Self-pay | Admitting: General Practice

## 2015-05-18 DIAGNOSIS — F411 Generalized anxiety disorder: Secondary | ICD-10-CM

## 2015-05-18 MED ORDER — ALPRAZOLAM 0.25 MG PO TABS: 0.2500 mg | ORAL_TABLET | Freq: Three times a day (TID) | ORAL | Status: DC | PRN

## 2015-05-18 NOTE — Telephone Encounter (Signed)
Med filled and faxed.  

## 2015-05-18 NOTE — Telephone Encounter (Signed)
Last OV 11-17-14 Alprazolam last filled 10-25-14 #30 with 0  No CSC on file

## 2015-06-01 ENCOUNTER — Encounter: Payer: BC Managed Care – PPO | Admitting: Family Medicine

## 2015-06-06 ENCOUNTER — Other Ambulatory Visit: Payer: Self-pay | Admitting: Family Medicine

## 2015-06-06 NOTE — Telephone Encounter (Signed)
Medication filled to pharmacy as requested.   

## 2015-06-26 ENCOUNTER — Telehealth: Payer: Self-pay | Admitting: Family Medicine

## 2015-06-26 NOTE — Telephone Encounter (Signed)
No record of a TB test in the system, pt needs a nurse visit.   Pt can always be placed on cancellation list for [employer] for her CPE since I do not show one on record.

## 2015-06-26 NOTE — Telephone Encounter (Signed)
Pt is needing to know date of last TB for new employer. Please call her to advise.  Pt is going to drop off form to be completed for new employer. She is on wait list with Dr. Etter Sjogren to transfer provider. She is past due for cpe and nothing else open right now.

## 2015-06-26 NOTE — Telephone Encounter (Signed)
Left msg for pt to call and schedule nurse visit for TB vaccination

## 2015-06-27 ENCOUNTER — Telehealth: Payer: Self-pay | Admitting: Family Medicine

## 2015-06-27 NOTE — Telephone Encounter (Signed)
Relation to EF:UWTK Call back number: [phone]    Reason for call:  Patient would like to have her TB done today and would like to know if she is up to date on her immunization. Please advise

## 2015-06-28 ENCOUNTER — Other Ambulatory Visit: Payer: Self-pay | Admitting: Family Medicine

## 2015-06-28 ENCOUNTER — Ambulatory Visit: Payer: BC Managed Care – PPO

## 2015-06-28 DIAGNOSIS — Z111 Encounter for screening for respiratory tuberculosis: Secondary | ICD-10-CM

## 2015-06-28 DIAGNOSIS — Z0184 Encounter for antibody response examination: Secondary | ICD-10-CM

## 2015-06-28 NOTE — Telephone Encounter (Signed)
Pt CANNOT have a TB test tomorrow (it is Thursday) Since she needs MMR titers, Varicella titers, and Hep B surface antibody and Hep B core antigen labs, we can do the Quantiferon Gold test to r/o TB 

## 2015-06-28 NOTE — Telephone Encounter (Signed)
Pt will need this TB for the form that was dropped off yesterday. She will also need to provide a copy of her immunizations (we need proof of MMR and Hep B. Please call pt and schedule nurse visit and relay this information to her. Thanks, JG//CMA

## 2015-06-28 NOTE — Telephone Encounter (Signed)
Pt has no way to obtain previous records. Please let her know what she needs to do for immunizations. She can schedule for some time next week but doesn't want to make multiple trips.  Previous provider office closed so she cannot get info for [place]. Does she had to get them all again?

## 2015-06-28 NOTE — Telephone Encounter (Signed)
Pt scheduled for lab appt.

## 2015-06-28 NOTE — Telephone Encounter (Signed)
Made appointment for patient for tb test for 06-28-2015    She does not have the records for immunization she assumed you had that in her chart  If not she will talk to the nurse while she is here to see what her options are.  Also she needs a vision test for this form as well can this be done at the nurse visit

## 2015-06-28 NOTE — Telephone Encounter (Signed)
Per phone note on 8/29, pt does not have one on file and is ok to have this completed.

## 2015-06-28 NOTE — Telephone Encounter (Signed)
Please advise, can we run titers for Hep B and MMR?

## 2015-06-30 ENCOUNTER — Other Ambulatory Visit: Payer: BC Managed Care – PPO

## 2015-08-22 ENCOUNTER — Other Ambulatory Visit: Payer: Self-pay

## 2015-08-22 ENCOUNTER — Encounter: Payer: Self-pay | Admitting: Family Medicine

## 2015-08-22 ENCOUNTER — Ambulatory Visit (INDEPENDENT_AMBULATORY_CARE_PROVIDER_SITE_OTHER): Payer: BC Managed Care – PPO | Admitting: Family Medicine

## 2015-08-22 VITALS — BP 127/60 | HR 60 | Temp 98.4°F | Ht 64.5 in | Wt 116.6 lb

## 2015-08-22 DIAGNOSIS — F411 Generalized anxiety disorder: Secondary | ICD-10-CM | POA: Diagnosis not present

## 2015-08-22 DIAGNOSIS — C4491 Basal cell carcinoma of skin, unspecified: Secondary | ICD-10-CM

## 2015-08-22 DIAGNOSIS — R319 Hematuria, unspecified: Secondary | ICD-10-CM

## 2015-08-22 DIAGNOSIS — G4700 Insomnia, unspecified: Secondary | ICD-10-CM

## 2015-08-22 DIAGNOSIS — Z0000 Encounter for general adult medical examination without abnormal findings: Secondary | ICD-10-CM | POA: Diagnosis not present

## 2015-08-22 LAB — POCT URINALYSIS DIPSTICK
BILIRUBIN UA: NEGATIVE
Glucose, UA: NEGATIVE
LEUKOCYTES UA: NEGATIVE
Nitrite, UA: NEGATIVE
Protein, UA: NEGATIVE
Spec Grav, UA: >=1.03
Urobilinogen, UA: 0.2
pH, UA: 6

## 2015-08-22 MED ORDER — CITALOPRAM HYDROBROMIDE 10 MG PO TABS: 10.0000 mg | ORAL_TABLET | Freq: Every day | ORAL | Status: DC

## 2015-08-22 MED ORDER — ALPRAZOLAM 0.25 MG PO TABS: 0.2500 mg | ORAL_TABLET | Freq: Three times a day (TID) | ORAL | Status: DC | PRN

## 2015-08-22 MED ORDER — ZOLPIDEM TARTRATE 5 MG PO TABS: ORAL_TABLET | ORAL | Status: DC

## 2015-08-22 NOTE — Patient Instructions (Signed)
Preventive Care for Adults, Female A healthy lifestyle and preventive care can promote health and wellness. Preventive health guidelines for women include the following key practices.  A routine yearly physical is a good way to check with your health care provider about your health and preventive screening. It is a chance to share any concerns and updates on your health and to receive a thorough exam.  Visit your dentist for a routine exam and preventive care every 6 months. Brush your teeth twice a day and floss once a day. Good oral hygiene prevents tooth decay and gum disease.  The frequency of eye exams is based on your age, health, family medical history, use of contact lenses, and other factors. Follow your health care provider's recommendations for frequency of eye exams.  Eat a healthy diet. Foods like vegetables, fruits, whole grains, low-fat dairy products, and lean protein foods contain the nutrients you need without too many calories. Decrease your intake of foods high in solid fats, added sugars, and salt. Eat the right amount of calories for you.Get information about a proper diet from your health care provider, if necessary.  Regular physical exercise is one of the most important things you can do for your health. Most adults should get at least 150 minutes of moderate-intensity exercise (any activity that increases your heart rate and causes you to sweat) each week. In addition, most adults need muscle-strengthening exercises on 2 or more days a week.  Maintain a healthy weight. The body mass index (BMI) is a screening tool to identify possible weight problems. It provides an estimate of body fat based on height and weight. Your health care provider can find your BMI and can help you achieve or maintain a healthy weight.For adults 20 years and older:  A BMI below 18.5 is considered underweight.  A BMI of 18.5 to 24.9 is normal.  A BMI of 25 to 29.9 is considered overweight.  A  BMI of 30 and above is considered obese.  Maintain normal blood lipids and cholesterol levels by exercising and minimizing your intake of saturated fat. Eat a balanced diet with plenty of fruit and vegetables. Blood tests for lipids and cholesterol should begin at age 45 and be repeated every 5 years. If your lipid or cholesterol levels are high, you are over 50, or you are at high risk for heart disease, you may need your cholesterol levels checked more frequently.Ongoing high lipid and cholesterol levels should be treated with medicines if diet and exercise are not working.  If you smoke, find out from your health care provider how to quit. If you do not use tobacco, do not start.  Lung cancer screening is recommended for adults aged 45-80 years who are at high risk for developing lung cancer because of a history of smoking. A yearly low-dose CT scan of the lungs is recommended for people who have at least a 30-pack-year history of smoking and are a current smoker or have quit within the past 15 years. A pack year of smoking is smoking an average of 1 pack of cigarettes a day for 1 year (for example: 1 pack a day for 30 years or 2 packs a day for 15 years). Yearly screening should continue until the smoker has stopped smoking for at least 15 years. Yearly screening should be stopped for people who develop a health problem that would prevent them from having lung cancer treatment.  If you are pregnant, do not drink alcohol. If you are  breastfeeding, be very cautious about drinking alcohol. If you are not pregnant and choose to drink alcohol, do not have more than 1 drink per day. One drink is considered to be 12 ounces (355 mL) of beer, 5 ounces (148 mL) of wine, or 1.5 ounces (44 mL) of liquor.  Avoid use of street drugs. Do not share needles with anyone. Ask for help if you need support or instructions about stopping the use of drugs.  High blood pressure causes heart disease and increases the risk  of stroke. Your blood pressure should be checked at least every 1 to 2 years. Ongoing high blood pressure should be treated with medicines if weight loss and exercise do not work.  If you are 55-79 years old, ask your health care provider if you should take aspirin to prevent strokes.  Diabetes screening is done by taking a blood sample to check your blood glucose level after you have not eaten for a certain period of time (fasting). If you are not overweight and you do not have risk factors for diabetes, you should be screened once every 3 years starting at age 45. If you are overweight or obese and you are 40-70 years of age, you should be screened for diabetes every year as part of your cardiovascular risk assessment.  Breast cancer screening is essential preventive care for women. You should practice "breast self-awareness." This means understanding the normal appearance and feel of your breasts and may include breast self-examination. Any changes detected, no matter how small, should be reported to a health care provider. Women in their 20s and 30s should have a clinical breast exam (CBE) by a health care provider as part of a regular health exam every 1 to 3 years. After age 40, women should have a CBE every year. Starting at age 40, women should consider having a mammogram (breast X-ray test) every year. Women who have a family history of breast cancer should talk to their health care provider about genetic screening. Women at a high risk of breast cancer should talk to their health care providers about having an MRI and a mammogram every year.  Breast cancer gene (BRCA)-related cancer risk assessment is recommended for women who have family members with BRCA-related cancers. BRCA-related cancers include breast, ovarian, tubal, and peritoneal cancers. Having family members with these cancers may be associated with an increased risk for harmful changes (mutations) in the breast cancer genes BRCA1 and  BRCA2. Results of the assessment will determine the need for genetic counseling and BRCA1 and BRCA2 testing.  Your health care provider may recommend that you be screened regularly for cancer of the pelvic organs (ovaries, uterus, and vagina). This screening involves a pelvic examination, including checking for microscopic changes to the surface of your cervix (Pap test). You may be encouraged to have this screening done every 3 years, beginning at age 21.  For women ages 30-65, health care providers may recommend pelvic exams and Pap testing every 3 years, or they may recommend the Pap and pelvic exam, combined with testing for human papilloma virus (HPV), every 5 years. Some types of HPV increase your risk of cervical cancer. Testing for HPV may also be done on women of any age with unclear Pap test results.  Other health care providers may not recommend any screening for nonpregnant women who are considered low risk for pelvic cancer and who do not have symptoms. Ask your health care provider if a screening pelvic exam is right for   you.  If you have had past treatment for cervical cancer or a condition that could lead to cancer, you need Pap tests and screening for cancer for at least 20 years after your treatment. If Pap tests have been discontinued, your risk factors (such as having a new sexual partner) need to be reassessed to determine if screening should resume. Some women have medical problems that increase the chance of getting cervical cancer. In these cases, your health care provider may recommend more frequent screening and Pap tests.  Colorectal cancer can be detected and often prevented. Most routine colorectal cancer screening begins at the age of 50 years and continues through age 75 years. However, your health care provider may recommend screening at an earlier age if you have risk factors for colon cancer. On a yearly basis, your health care provider may provide home test kits to check  for hidden blood in the stool. Use of a small camera at the end of a tube, to directly examine the colon (sigmoidoscopy or colonoscopy), can detect the earliest forms of colorectal cancer. Talk to your health care provider about this at age 50, when routine screening begins. Direct exam of the colon should be repeated every 5-10 years through age 75 years, unless early forms of precancerous polyps or small growths are found.  People who are at an increased risk for hepatitis B should be screened for this virus. You are considered at high risk for hepatitis B if:  You were born in a country where hepatitis B occurs often. Talk with your health care provider about which countries are considered high risk.  Your parents were born in a high-risk country and you have not received a shot to protect against hepatitis B (hepatitis B vaccine).  You have HIV or AIDS.  You use needles to inject street drugs.  You live with, or have sex with, someone who has hepatitis B.  You get hemodialysis treatment.  You take certain medicines for conditions like cancer, organ transplantation, and autoimmune conditions.  Hepatitis C blood testing is recommended for all people born from 1945 through 1965 and any individual with known risks for hepatitis C.  Practice safe sex. Use condoms and avoid high-risk sexual practices to reduce the spread of sexually transmitted infections (STIs). STIs include gonorrhea, chlamydia, syphilis, trichomonas, herpes, HPV, and human immunodeficiency virus (HIV). Herpes, HIV, and HPV are viral illnesses that have no cure. They can result in disability, cancer, and death.  You should be screened for sexually transmitted illnesses (STIs) including gonorrhea and chlamydia if:  You are sexually active and are younger than 24 years.  You are older than 24 years and your health care provider tells you that you are at risk for this type of infection.  Your sexual activity has changed  since you were last screened and you are at an increased risk for chlamydia or gonorrhea. Ask your health care provider if you are at risk.  If you are at risk of being infected with HIV, it is recommended that you take a prescription medicine daily to prevent HIV infection. This is called preexposure prophylaxis (PrEP). You are considered at risk if:  You are sexually active and do not regularly use condoms or know the HIV status of your partner(s).  You take drugs by injection.  You are sexually active with a partner who has HIV.  Talk with your health care provider about whether you are at high risk of being infected with HIV. If   you choose to begin PrEP, you should first be tested for HIV. You should then be tested every 3 months for as long as you are taking PrEP.  Osteoporosis is a disease in which the bones lose minerals and strength with aging. This can result in serious bone fractures or breaks. The risk of osteoporosis can be identified using a bone density scan. Women ages 67 years and over and women at risk for fractures or osteoporosis should discuss screening with their health care providers. Ask your health care provider whether you should take a calcium supplement or vitamin D to reduce the rate of osteoporosis.  Menopause can be associated with physical symptoms and risks. Hormone replacement therapy is available to decrease symptoms and risks. You should talk to your health care provider about whether hormone replacement therapy is right for you.  Use sunscreen. Apply sunscreen liberally and repeatedly throughout the day. You should seek shade when your shadow is shorter than you. Protect yourself by wearing long sleeves, pants, a wide-brimmed hat, and sunglasses year round, whenever you are outdoors.  Once a month, do a whole body skin exam, using a mirror to look at the skin on your back. Tell your health care provider of new moles, moles that have irregular borders, moles that  are larger than a pencil eraser, or moles that have changed in shape or color.  Stay current with required vaccines (immunizations).  Influenza vaccine. All adults should be immunized every year.  Tetanus, diphtheria, and acellular pertussis (Td, Tdap) vaccine. Pregnant women should receive 1 dose of Tdap vaccine during each pregnancy. The dose should be obtained regardless of the length of time since the last dose. Immunization is preferred during the 27th-36th week of gestation. An adult who has not previously received Tdap or who does not know her vaccine status should receive 1 dose of Tdap. This initial dose should be followed by tetanus and diphtheria toxoids (Td) booster doses every 10 years. Adults with an unknown or incomplete history of completing a 3-dose immunization series with Td-containing vaccines should begin or complete a primary immunization series including a Tdap dose. Adults should receive a Td booster every 10 years.  Varicella vaccine. An adult without evidence of immunity to varicella should receive 2 doses or a second dose if she has previously received 1 dose. Pregnant females who do not have evidence of immunity should receive the first dose after pregnancy. This first dose should be obtained before leaving the health care facility. The second dose should be obtained 4-8 weeks after the first dose.  Human papillomavirus (HPV) vaccine. Females aged 13-26 years who have not received the vaccine previously should obtain the 3-dose series. The vaccine is not recommended for use in pregnant females. However, pregnancy testing is not needed before receiving a dose. If a female is found to be pregnant after receiving a dose, no treatment is needed. In that case, the remaining doses should be delayed until after the pregnancy. Immunization is recommended for any person with an immunocompromised condition through the age of 61 years if she did not get any or all doses earlier. During the  3-dose series, the second dose should be obtained 4-8 weeks after the first dose. The third dose should be obtained 24 weeks after the first dose and 16 weeks after the second dose.  Zoster vaccine. One dose is recommended for adults aged 30 years or older unless certain conditions are present.  Measles, mumps, and rubella (MMR) vaccine. Adults born  before 1957 generally are considered immune to measles and mumps. Adults born in 1957 or later should have 1 or more doses of MMR vaccine unless there is a contraindication to the vaccine or there is laboratory evidence of immunity to each of the three diseases. A routine second dose of MMR vaccine should be obtained at least 28 days after the first dose for students attending postsecondary schools, health care workers, or international travelers. People who received inactivated measles vaccine or an unknown type of measles vaccine during 1963-1967 should receive 2 doses of MMR vaccine. People who received inactivated mumps vaccine or an unknown type of mumps vaccine before 1979 and are at high risk for mumps infection should consider immunization with 2 doses of MMR vaccine. For females of childbearing age, rubella immunity should be determined. If there is no evidence of immunity, females who are not pregnant should be vaccinated. If there is no evidence of immunity, females who are pregnant should delay immunization until after pregnancy. Unvaccinated health care workers born before 1957 who lack laboratory evidence of measles, mumps, or rubella immunity or laboratory confirmation of disease should consider measles and mumps immunization with 2 doses of MMR vaccine or rubella immunization with 1 dose of MMR vaccine.  Pneumococcal 13-valent conjugate (PCV13) vaccine. When indicated, a person who is uncertain of his immunization history and has no record of immunization should receive the PCV13 vaccine. All adults 65 years of age and older should receive this  vaccine. An adult aged 19 years or older who has certain medical conditions and has not been previously immunized should receive 1 dose of PCV13 vaccine. This PCV13 should be followed with a dose of pneumococcal polysaccharide (PPSV23) vaccine. Adults who are at high risk for pneumococcal disease should obtain the PPSV23 vaccine at least 8 weeks after the dose of PCV13 vaccine. Adults older than 55 years of age who have normal immune system function should obtain the PPSV23 vaccine dose at least 1 year after the dose of PCV13 vaccine.  Pneumococcal polysaccharide (PPSV23) vaccine. When PCV13 is also indicated, PCV13 should be obtained first. All adults aged 65 years and older should be immunized. An adult younger than age 65 years who has certain medical conditions should be immunized. Any person who resides in a nursing home or long-term care facility should be immunized. An adult smoker should be immunized. People with an immunocompromised condition and certain other conditions should receive both PCV13 and PPSV23 vaccines. People with human immunodeficiency virus (HIV) infection should be immunized as soon as possible after diagnosis. Immunization during chemotherapy or radiation therapy should be avoided. Routine use of PPSV23 vaccine is not recommended for American Indians, [place] Natives, or people younger than 65 years unless there are medical conditions that require PPSV23 vaccine. When indicated, people who have unknown immunization and have no record of immunization should receive PPSV23 vaccine. One-time revaccination 5 years after the first dose of PPSV23 is recommended for people aged 19-64 years who have chronic kidney failure, nephrotic syndrome, asplenia, or immunocompromised conditions. People who received 1-2 doses of PPSV23 before age 65 years should receive another dose of PPSV23 vaccine at age 65 years or later if at least 5 years have passed since the previous dose. Doses of PPSV23 are not  needed for people immunized with PPSV23 at or after age 65 years.  Meningococcal vaccine. Adults with asplenia or persistent complement component deficiencies should receive 2 doses of quadrivalent meningococcal conjugate (MenACWY-D) vaccine. The doses should be obtained   at least 2 months apart. Microbiologists working with certain meningococcal bacteria, [place] recruits, people at risk during an outbreak, and people who travel to or live in countries with a high rate of meningitis should be immunized. A first-year college student up through age 34 years who is living in a residence hall should receive a dose if she did not receive a dose on or after her 16th birthday. Adults who have certain high-risk conditions should receive one or more doses of vaccine.  Hepatitis A vaccine. Adults who wish to be protected from this disease, have certain high-risk conditions, work with hepatitis A-infected animals, work in hepatitis A research labs, or travel to or work in countries with a high rate of hepatitis A should be immunized. Adults who were previously unvaccinated and who anticipate close contact with an international adoptee during the first 60 days after arrival in the [place] from a country with a high rate of hepatitis A should be immunized.  Hepatitis B vaccine. Adults who wish to be protected from this disease, have certain high-risk conditions, may be exposed to blood or other infectious body fluids, are household contacts or sex partners of hepatitis B positive people, are clients or workers in certain care facilities, or travel to or work in countries with a high rate of hepatitis B should be immunized.  Haemophilus influenzae type b (Hib) vaccine. A previously unvaccinated person with asplenia or sickle cell disease or having a scheduled splenectomy should receive 1 dose of Hib vaccine. Regardless of previous immunization, a recipient of a hematopoietic stem cell transplant should receive a  3-dose series 6-12 months after her successful transplant. Hib vaccine is not recommended for adults with HIV infection. Preventive Services / Frequency Ages 35 to 4 years  Blood pressure check.** / Every 3-5 years.  Lipid and cholesterol check.** / Every 5 years beginning at age 60.  Clinical breast exam.** / Every 3 years for women in their 71s and 10s.  BRCA-related cancer risk assessment.** / For women who have family members with a BRCA-related cancer (breast, ovarian, tubal, or peritoneal cancers).  Pap test.** / Every 2 years from ages 76 through 26. Every 3 years starting at age 61 through age 76 or 93 with a history of 3 consecutive normal Pap tests.  HPV screening.** / Every 3 years from ages 37 through ages 60 to 51 with a history of 3 consecutive normal Pap tests.  Hepatitis C blood test.** / For any individual with known risks for hepatitis C.  Skin self-exam. / Monthly.  Influenza vaccine. / Every year.  Tetanus, diphtheria, and acellular pertussis (Tdap, Td) vaccine.** / Consult your health care provider. Pregnant women should receive 1 dose of Tdap vaccine during each pregnancy. 1 dose of Td every 10 years.  Varicella vaccine.** / Consult your health care provider. Pregnant females who do not have evidence of immunity should receive the first dose after pregnancy.  HPV vaccine. / 3 doses over 6 months, if 93 and younger. The vaccine is not recommended for use in pregnant females. However, pregnancy testing is not needed before receiving a dose.  Measles, mumps, rubella (MMR) vaccine.** / You need at least 1 dose of MMR if you were born in 1957 or later. You may also need a 2nd dose. For females of childbearing age, rubella immunity should be determined. If there is no evidence of immunity, females who are not pregnant should be vaccinated. If there is no evidence of immunity, females who are  pregnant should delay immunization until after pregnancy.  Pneumococcal  13-valent conjugate (PCV13) vaccine.** / Consult your health care provider.  Pneumococcal polysaccharide (PPSV23) vaccine.** / 1 to 2 doses if you smoke cigarettes or if you have certain conditions.  Meningococcal vaccine.** / 1 dose if you are age 68 to 8 years and a Market researcher living in a residence hall, or have one of several medical conditions, you need to get vaccinated against meningococcal disease. You may also need additional booster doses.  Hepatitis A vaccine.** / Consult your health care provider.  Hepatitis B vaccine.** / Consult your health care provider.  Haemophilus influenzae type b (Hib) vaccine.** / Consult your health care provider. Ages 7 to 53 years  Blood pressure check.** / Every year.  Lipid and cholesterol check.** / Every 5 years beginning at age 25 years.  Lung cancer screening. / Every year if you are aged 11-80 years and have a 30-pack-year history of smoking and currently smoke or have quit within the past 15 years. Yearly screening is stopped once you have quit smoking for at least 15 years or develop a health problem that would prevent you from having lung cancer treatment.  Clinical breast exam.** / Every year after age 48 years.  BRCA-related cancer risk assessment.** / For women who have family members with a BRCA-related cancer (breast, ovarian, tubal, or peritoneal cancers).  Mammogram.** / Every year beginning at age 41 years and continuing for as long as you are in good health. Consult with your health care provider.  Pap test.** / Every 3 years starting at age 65 years through age 37 or 70 years with a history of 3 consecutive normal Pap tests.  HPV screening.** / Every 3 years from ages 72 years through ages 60 to 40 years with a history of 3 consecutive normal Pap tests.  Fecal occult blood test (FOBT) of stool. / Every year beginning at age 21 years and continuing until age 5 years. You may not need to do this test if you get  a colonoscopy every 10 years.  Flexible sigmoidoscopy or colonoscopy.** / Every 5 years for a flexible sigmoidoscopy or every 10 years for a colonoscopy beginning at age 35 years and continuing until age 48 years.  Hepatitis C blood test.** / For all people born from [birth year] through 1965 and any individual with known risks for hepatitis C.  Skin self-exam. / Monthly.  Influenza vaccine. / Every year.  Tetanus, diphtheria, and acellular pertussis (Tdap/Td) vaccine.** / Consult your health care provider. Pregnant women should receive 1 dose of Tdap vaccine during each pregnancy. 1 dose of Td every 10 years.  Varicella vaccine.** / Consult your health care provider. Pregnant females who do not have evidence of immunity should receive the first dose after pregnancy.  Zoster vaccine.** / 1 dose for adults aged 30 years or older.  Measles, mumps, rubella (MMR) vaccine.** / You need at least 1 dose of MMR if you were born in 1957 or later. You may also need a second dose. For females of childbearing age, rubella immunity should be determined. If there is no evidence of immunity, females who are not pregnant should be vaccinated. If there is no evidence of immunity, females who are pregnant should delay immunization until after pregnancy.  Pneumococcal 13-valent conjugate (PCV13) vaccine.** / Consult your health care provider.  Pneumococcal polysaccharide (PPSV23) vaccine.** / 1 to 2 doses if you smoke cigarettes or if you have certain conditions.  Meningococcal vaccine.** /  Consult your health care provider.  Hepatitis A vaccine.** / Consult your health care provider.  Hepatitis B vaccine.** / Consult your health care provider.  Haemophilus influenzae type b (Hib) vaccine.** / Consult your health care provider. Ages 64 years and over  Blood pressure check.** / Every year.  Lipid and cholesterol check.** / Every 5 years beginning at age 23 years.  Lung cancer screening. / Every year if you  are aged 16-80 years and have a 30-pack-year history of smoking and currently smoke or have quit within the past 15 years. Yearly screening is stopped once you have quit smoking for at least 15 years or develop a health problem that would prevent you from having lung cancer treatment.  Clinical breast exam.** / Every year after age 74 years.  BRCA-related cancer risk assessment.** / For women who have family members with a BRCA-related cancer (breast, ovarian, tubal, or peritoneal cancers).  Mammogram.** / Every year beginning at age 44 years and continuing for as long as you are in good health. Consult with your health care provider.  Pap test.** / Every 3 years starting at age 58 years through age 22 or 39 years with 3 consecutive normal Pap tests. Testing can be stopped between 65 and 70 years with 3 consecutive normal Pap tests and no abnormal Pap or HPV tests in the past 10 years.  HPV screening.** / Every 3 years from ages 64 years through ages 70 or 61 years with a history of 3 consecutive normal Pap tests. Testing can be stopped between 65 and 70 years with 3 consecutive normal Pap tests and no abnormal Pap or HPV tests in the past 10 years.  Fecal occult blood test (FOBT) of stool. / Every year beginning at age 40 years and continuing until age 27 years. You may not need to do this test if you get a colonoscopy every 10 years.  Flexible sigmoidoscopy or colonoscopy.** / Every 5 years for a flexible sigmoidoscopy or every 10 years for a colonoscopy beginning at age 7 years and continuing until age 32 years.  Hepatitis C blood test.** / For all people born from [birth year] through 1966 and any individual with known risks for hepatitis C.  Osteoporosis screening.** / A one-time screening for women ages 30 years and over and women at risk for fractures or osteoporosis.  Skin self-exam. / Monthly.  Influenza vaccine. / Every year.  Tetanus, diphtheria, and acellular pertussis (Tdap/Td)  vaccine.** / 1 dose of Td every 10 years.  Varicella vaccine.** / Consult your health care provider.  Zoster vaccine.** / 1 dose for adults aged 35 years or older.  Pneumococcal 13-valent conjugate (PCV13) vaccine.** / Consult your health care provider.  Pneumococcal polysaccharide (PPSV23) vaccine.** / 1 dose for all adults aged 46 years and older.  Meningococcal vaccine.** / Consult your health care provider.  Hepatitis A vaccine.** / Consult your health care provider.  Hepatitis B vaccine.** / Consult your health care provider.  Haemophilus influenzae type b (Hib) vaccine.** / Consult your health care provider. ** Family history and personal history of risk and conditions may change your health care provider's recommendations.   This information is not intended to replace advice given to you by your health care provider. Make sure you discuss any questions you have with your health care provider.   Document Released: 12/10/2001 Document Revised: 11/04/2014 Document Reviewed: 03/11/2011 Elsevier Interactive Patient Education [employer].

## 2015-08-22 NOTE — Progress Notes (Signed)
Subjective:     Adriana Lopez is a 54 y.o. female and is here for a comprehensive physical exam. The patient reports no problems.  Social History   Social History  . Marital Status: Married    Spouse Name: N/A  . Number of Children: N/A  . Years of Education: N/A   Occupational History  . Not on file.   Social History Main Topics  . Smoking status: Never Smoker   . Smokeless tobacco: Not on file  . Alcohol Use: 4.2 oz/week    7 Glasses of wine per week  . Drug Use: No  . Sexual Activity: Yes   Other Topics Concern  . Not on file   Social History Narrative   Health Maintenance  Topic Date Due  . Hepatitis C Screening  Jun 01, 1961  . HIV Screening  05/09/1975  . COLONOSCOPY  05/08/2010  . MAMMOGRAM  11/27/2015  . INFLUENZA VACCINE  05/28/2016  . PAP SMEAR  08/28/2016  . TETANUS/TDAP  03/09/2019    The following portions of the patient's history were reviewed and updated as appropriate:  She  has a past medical history of Allergy and Anxiety. She  does not have any pertinent problems on file. She  has past surgical history that includes Tonsillectomy and adenoidectomy and Refractive surgery (2012). Her family history includes Alzheimer's disease in her maternal aunt and mother; Cancer in her father and maternal aunt; Heart disease in her father. There is no history of Diabetes, Stroke, or Hypertension. She  reports that she has never smoked. She does not have any smokeless tobacco history on file. She reports that she drinks about 4.2 oz of alcohol per week. She reports that she does not use illicit drugs. She has a current medication list which includes the following prescription(s): acyclovir, alprazolam, cetirizine, citalopram, estrace vaginal, lo loestrin fe, nutritional supplements, and zolpidem. Current Outpatient Prescriptions on File Prior to Visit  Medication Sig Dispense Refill  . acyclovir (ZOVIRAX) 200 MG capsule TAKE ONE CAPSULE BY MOUTH THREE TIMES  DAILY 30 capsule 3  . cetirizine (ZYRTEC) 10 MG tablet Take 10 mg by mouth daily.    Marland Kitchen ESTRACE VAGINAL 0.1 MG/GM vaginal cream     . LO LOESTRIN FE 1 MG-10 MCG / 10 MCG tablet     . Nutritional Supplements (HRT SUPPORT PO) Take by mouth.     No current facility-administered medications on file prior to visit.   She has No Known Allergies..  Review of Systems Review of Systems  Constitutional: Negative for activity change, appetite change and fatigue.  HENT: Negative for hearing loss, congestion, tinnitus and ear discharge.  dentist q27mEyes: Negative for visual disturbance (see optho q1y -- vision corrected to 20/20 with glasses).  Respiratory: Negative for cough, chest tightness and shortness of breath.   Cardiovascular: Negative for chest pain, palpitations and leg swelling.  Gastrointestinal: Negative for abdominal pain, diarrhea, constipation and abdominal distention.  Genitourinary: Negative for urgency, frequency, decreased urine volume and difficulty urinating.  Musculoskeletal: Negative for back pain, arthralgias and gait problem.  Skin: Negative for color change, pallor and rash.  Neurological: Negative for dizziness, light-headedness, numbness and headaches.  Hematological: Negative for adenopathy. Does not bruise/bleed easily.  Psychiatric/Behavioral: Negative for suicidal ideas, confusion, sleep disturbance, self-injury, dysphoric mood, decreased concentration and agitation.       Objective:    BP 127/60 mmHg  Pulse 60  Temp(Src) 98.4 F (36.9 C) (Oral)  Ht 5' 4.5" (1.638 m)  Wt 116 lb 9.6 oz (52.889 kg)  BMI 19.71 kg/m2  SpO2 10% General appearance: alert, cooperative, appears stated age and no distress Head: Normocephalic, without obvious abnormality, atraumatic Eyes: conjunctivae/corneas clear. PERRL, EOM's intact. Fundi benign. Ears: normal TM's and external ear canals both ears Nose: Nares normal. Septum midline. Mucosa normal. No drainage or sinus  tenderness. Throat: lips, mucosa, and tongue normal; teeth and gums normal Neck: no adenopathy, no carotid bruit, no JVD, supple, symmetrical, trachea midline and thyroid not enlarged, symmetric, no tenderness/mass/nodules Back: symmetric, no curvature. ROM normal. No CVA tenderness. Lungs: clear to auscultation bilaterally Breasts: gyn Heart: regular rate and rhythm, S1, S2 normal, no murmur, click, rub or gallop Abdomen: soft, non-tender; bowel sounds normal; no masses,  no organomegaly Pelvic: deferred--gyn Extremities: extremities normal, atraumatic, no cyanosis or edema Pulses: 2+ and symmetric Skin: Skin color, texture, turgor normal. No rashes or lesions Lymph nodes: Cervical, supraclavicular, and axillary nodes normal. Neurologic: Alert and oriented X 3, normal strength and tone. Normal symmetric reflexes. Normal coordination and gait Psych- no depression, no anxiety      Assessment:    Healthy female exam.       Plan:    ghm utd Check labs See After Visit Summary for Counseling Recommendations   1. Generalized anxiety disorder   - ALPRAZolam (XANAX) 0.25 MG tablet; Take 1 tablet (0.25 mg total) by mouth 3 (three) times daily as needed for anxiety.  Dispense: 30 tablet; Refill: 0 - citalopram (CELEXA) 10 MG tablet; Take 1 tablet (10 mg total) by mouth daily.  Dispense: 90 tablet; Refill: 3 - POCT urinalysis dipstick - TSH - CBC with Differential/Platelet - Lipid panel - Comp Met (CMET)  2. Insomnia   - zolpidem (AMBIEN) 5 MG tablet; TAKE ONE TABLET BY MOUTH EVERY THREE NIGHTS AS NEEDED  Dispense: 10 tablet; Refill: 3 - POCT urinalysis dipstick - TSH - CBC with Differential/Platelet - Lipid panel - Comp Met (CMET)  3. Preventative health care   - POCT urinalysis dipstick - TSH - CBC with Differential/Platelet - Lipid panel - Comp Met (CMET) - Hepatitis C antibody - HIV antibody  4. Basal cell carcinoma    5. Blood in urine   - Urine culture

## 2015-08-22 NOTE — Progress Notes (Signed)
Pre visit review using our clinic review tool, if applicable. No additional management support is needed unless otherwise documented below in the visit note. 

## 2015-08-23 LAB — CBC WITH DIFFERENTIAL/PLATELET
BASOS ABS: 0.1 10*3/uL (ref 0.0–0.1)
Basophils Relative: 0.6 % (ref 0.0–3.0)
EOS PCT: 2 % (ref 0.0–5.0)
Eosinophils Absolute: 0.2 10*3/uL (ref 0.0–0.7)
HCT: 40.6 % (ref 36.0–46.0)
HEMOGLOBIN: 13.6 g/dL (ref 12.0–15.0)
Lymphocytes Relative: 34.8 % (ref 12.0–46.0)
Lymphs Abs: 3.4 10*3/uL (ref 0.7–4.0)
MCHC: 33.6 g/dL (ref 30.0–36.0)
MCV: 99.5 fl (ref 78.0–100.0)
MONO ABS: 0.7 10*3/uL (ref 0.1–1.0)
MONOS PCT: 7.1 % (ref 3.0–12.0)
NEUTROS PCT: 55.5 % (ref 43.0–77.0)
Neutro Abs: 5.4 10*3/uL (ref 1.4–7.7)
Platelets: 353 10*3/uL (ref 150.0–400.0)
RBC: 4.08 Mil/uL (ref 3.87–5.11)
RDW: 12.9 % (ref 11.5–15.5)
WBC: 9.7 10*3/uL (ref 4.0–10.5)

## 2015-08-23 LAB — LIPID PANEL
CHOLESTEROL: 168 mg/dL (ref 0–200)
HDL: 91.4 mg/dL (ref >39.00)
LDL CALC: 65 mg/dL (ref 0–99)
NonHDL: 76.35
Total CHOL/HDL Ratio: 2
Triglycerides: 59 mg/dL (ref 0.0–149.0)
VLDL: 11.8 mg/dL (ref 0.0–40.0)

## 2015-08-23 LAB — COMPREHENSIVE METABOLIC PANEL
ALBUMIN: 4.4 g/dL (ref 3.5–5.2)
ALK PHOS: 41 U/L (ref 39–117)
ALT: 11 U/L (ref 0–35)
AST: 19 U/L (ref 0–37)
BUN: 14 mg/dL (ref 6–23)
CALCIUM: 9.3 mg/dL (ref 8.4–10.5)
CHLORIDE: 100 meq/L (ref 96–112)
CO2: 27 mEq/L (ref 19–32)
Creatinine, Ser: 0.73 mg/dL (ref 0.40–1.20)
GFR: 87.88 mL/min (ref >60.00)
Glucose, Bld: 93 mg/dL (ref 70–99)
POTASSIUM: 3.9 meq/L (ref 3.5–5.1)
SODIUM: 137 meq/L (ref 135–145)
TOTAL PROTEIN: 7.1 g/dL (ref 6.0–8.3)
Total Bilirubin: 1.1 mg/dL (ref 0.2–1.2)

## 2015-08-23 LAB — TSH: TSH: 3.33 u[IU]/mL (ref 0.35–4.50)

## 2015-08-23 LAB — HIV ANTIBODY (ROUTINE TESTING W REFLEX): HIV 1&2 Ab, 4th Generation: NONREACTIVE

## 2015-08-23 LAB — HEPATITIS C ANTIBODY: HCV AB: NEGATIVE

## 2015-08-24 LAB — URINE CULTURE
Colony Count: NO GROWTH
Organism ID, Bacteria: NO GROWTH

## 2015-10-05 ENCOUNTER — Telehealth: Payer: Self-pay | Admitting: Family Medicine

## 2015-10-05 NOTE — Telephone Encounter (Signed)
error:[id number] ° °

## 2015-10-16 ENCOUNTER — Encounter: Payer: BC Managed Care – PPO | Admitting: Family Medicine

## 2015-11-13 ENCOUNTER — Other Ambulatory Visit: Payer: Self-pay | Admitting: Family Medicine

## 2015-11-14 NOTE — Telephone Encounter (Signed)
Last seen and filled 08/22/15 #30  Please advise    KP

## 2015-11-15 ENCOUNTER — Ambulatory Visit: Payer: BC Managed Care – PPO

## 2015-11-28 ENCOUNTER — Ambulatory Visit: Payer: BC Managed Care – PPO | Admitting: Physical Therapy

## 2015-12-04 ENCOUNTER — Encounter: Payer: BC Managed Care – PPO | Admitting: Physical Therapy

## 2015-12-07 ENCOUNTER — Encounter: Payer: BC Managed Care – PPO | Admitting: Physical Therapy

## 2015-12-11 ENCOUNTER — Encounter: Payer: BC Managed Care – PPO | Admitting: Physical Therapy

## 2015-12-14 ENCOUNTER — Encounter: Payer: BC Managed Care – PPO | Admitting: Physical Therapy

## 2015-12-18 ENCOUNTER — Encounter: Payer: BC Managed Care – PPO | Admitting: Physical Therapy

## 2015-12-21 ENCOUNTER — Encounter: Payer: BC Managed Care – PPO | Admitting: Physical Therapy

## 2016-01-09 ENCOUNTER — Ambulatory Visit (INDEPENDENT_AMBULATORY_CARE_PROVIDER_SITE_OTHER): Payer: BC Managed Care – PPO | Admitting: Family Medicine

## 2016-01-09 ENCOUNTER — Encounter: Payer: Self-pay | Admitting: Family Medicine

## 2016-01-09 VITALS — BP 112/74 | HR 79 | Temp 97.8°F | Wt 116.2 lb

## 2016-01-09 DIAGNOSIS — R059 Cough, unspecified: Secondary | ICD-10-CM

## 2016-01-09 DIAGNOSIS — R05 Cough: Secondary | ICD-10-CM | POA: Diagnosis not present

## 2016-01-09 DIAGNOSIS — J0140 Acute pansinusitis, unspecified: Secondary | ICD-10-CM

## 2016-01-09 MED ORDER — PROMETHAZINE-DM 6.25-15 MG/5ML PO SYRP: 5.0000 mL | ORAL_SOLUTION | Freq: Four times a day (QID) | ORAL | Status: DC | PRN

## 2016-01-09 MED ORDER — AMOXICILLIN-POT CLAVULANATE 875-125 MG PO TABS: 1.0000 | ORAL_TABLET | Freq: Two times a day (BID) | ORAL | Status: DC

## 2016-01-09 MED ORDER — FLUTICASONE PROPIONATE 50 MCG/ACT NA SUSP: 2.0000 | Freq: Every day | NASAL | Status: DC

## 2016-01-09 NOTE — Progress Notes (Signed)
  Subjective:     Adriana Lopez is a 55 y.o. female who presents for evaluation of sinus pain. Symptoms include: congestion, cough, facial pain, headaches, nasal congestion and post nasal drip. Onset of symptoms was 4 days ago. Symptoms have been gradually worsening since that time. Past history is significant for no history of pneumonia or bronchitis. Patient is a non-smoker.  The following portions of the patient's history were reviewed and updated as appropriate:  She  has a past medical history of Allergy and Anxiety. She  does not have any pertinent problems on file. She  has past surgical history that includes Tonsillectomy and adenoidectomy and Refractive surgery (2012). Her family history includes Alzheimer's disease in her maternal aunt and mother; Cancer in her father and maternal aunt; Heart disease in her father. There is no history of Diabetes, Stroke, or Hypertension. She  reports that she has never smoked. She does not have any smokeless tobacco history on file. She reports that she drinks about 4.2 oz of alcohol per week. She reports that she does not use illicit drugs. She has a current medication list which includes the following prescription(s): acetaminophen, acyclovir, alprazolam, cetirizine, citalopram, diazepam, estrace vaginal, hydrocodone-acetaminophen, lo loestrin fe, nutritional supplements, prednisone, and zolpidem. Current Outpatient Prescriptions on File Prior to Visit  Medication Sig Dispense Refill  . acyclovir (ZOVIRAX) 200 MG capsule TAKE ONE CAPSULE BY MOUTH THREE TIMES DAILY 30 capsule 3  . ALPRAZolam (XANAX) 0.25 MG tablet TAKE 1 TABLET BY MOUTH 3 TIMES A DAY AS NEEDED FOR ANXIETY 30 tablet 0  . cetirizine (ZYRTEC) 10 MG tablet Take 10 mg by mouth daily.    . citalopram (CELEXA) 10 MG tablet Take 1 tablet (10 mg total) by mouth daily. 90 tablet 3  . ESTRACE VAGINAL 0.1 MG/GM vaginal cream     . LO LOESTRIN FE 1 MG-10 MCG / 10 MCG tablet     . Nutritional  Supplements (HRT SUPPORT PO) Take by mouth.    . zolpidem (AMBIEN) 5 MG tablet TAKE ONE TABLET BY MOUTH EVERY THREE NIGHTS AS NEEDED 10 tablet 3   No current facility-administered medications on file prior to visit.   She has No Known Allergies..  Review of Systems Pertinent items are noted in HPI.   Objective:    BP 112/74 mmHg  Pulse 79  Temp(Src) 97.8 F (36.6 C) (Oral)  Wt 116 lb 3.2 oz (52.708 kg)  SpO2 98% General appearance: alert, cooperative, appears stated age and no distress Ears: normal TM's and external ear canals both ears Nose: green discharge, moderate congestion, turbinates red, swollen, sinus tenderness bilateral Throat: abnormal findings: mild oropharyngeal erythema Neck: mild anterior cervical adenopathy, supple, symmetrical, trachea midline and thyroid not enlarged, symmetric, no tenderness/mass/nodules Lungs: clear to auscultation bilaterally Heart: S1, S2 normal Extremities: extremities normal, atraumatic, no cyanosis or edema    Assessment:    Acute bacterial sinusitis.    Plan:    Nasal steroids per medication orders. Antihistamines per medication orders. Augmentin per medication orders.

## 2016-01-09 NOTE — Progress Notes (Signed)
Pre visit review using our clinic review tool, if applicable. No additional management support is needed unless otherwise documented below in the visit note. 

## 2016-01-09 NOTE — Patient Instructions (Signed)

## 2016-01-12 ENCOUNTER — Other Ambulatory Visit: Payer: Self-pay | Admitting: Family Medicine

## 2016-01-15 NOTE — Telephone Encounter (Signed)
Last seen 01/09/16 and filled 11/14/15 #30   Please advise     KP

## 2016-01-26 ENCOUNTER — Telehealth: Payer: Self-pay | Admitting: Family Medicine

## 2016-01-26 ENCOUNTER — Other Ambulatory Visit: Payer: Self-pay | Admitting: Family Medicine

## 2016-01-26 DIAGNOSIS — H00019 Hordeolum externum unspecified eye, unspecified eyelid: Secondary | ICD-10-CM

## 2016-01-26 MED ORDER — ERYTHROMYCIN 5 MG/GM OP OINT: 1.0000 "application " | TOPICAL_OINTMENT | Freq: Four times a day (QID) | OPHTHALMIC | Status: DC

## 2016-01-26 NOTE — Telephone Encounter (Signed)
Sent in

## 2016-01-26 NOTE — Telephone Encounter (Signed)
Pt called in to check the status of message sent. Please assist and advise further.    Thanks.

## 2016-01-26 NOTE — Telephone Encounter (Signed)
Patient has been made aware that the Rx has been sent.     KP

## 2016-01-26 NOTE — Telephone Encounter (Signed)
Pt called in because he says that she has a sty on the lower eye lid. Pt says that it is puffy and irritated. Pt would like to know if PCP could call in something to pharmacy for her.    CB: [phone]

## 2016-01-31 ENCOUNTER — Telehealth: Payer: Self-pay | Admitting: Family Medicine

## 2016-01-31 NOTE — Telephone Encounter (Signed)
Pt called back in in regards to the sty that's on her eye. She says that pcp prescribed her a medication. Pt says that medication helped a little but not a whole lot. Pt would like to know if PCP could call her something else in. Informed pt that an appt may be needed. She says that if so she can only come in on Friday afternoon.    Please advise.    CBQS:[phone] -

## 2016-02-01 NOTE — Telephone Encounter (Signed)
Can be reached: [phone]  Reason for call: she has been using warm compresses since Fridaym and has been using ointment, and eye wash solution from pharmacy. She said the top eyelid is still red and swollen. The stye has gone down. Better than it was. Asking if she should wait thru the weekend and continue treatment.

## 2016-02-01 NOTE — Telephone Encounter (Signed)
Detailed message left advising to use the warm compresses along with the ointment and if no improvement she will need to see an eye doctor.      KP

## 2016-02-01 NOTE — Telephone Encounter (Signed)
Last seen 01/09/16. Please advise    KP

## 2016-02-01 NOTE — Telephone Encounter (Signed)
Warm compresses and the ointment given should be enough--- if not she probably needs to see an eye dr

## 2016-02-01 NOTE — Telephone Encounter (Signed)
Refer to opth

## 2016-02-01 NOTE — Telephone Encounter (Signed)
Please advise      KP 

## 2016-02-02 NOTE — Telephone Encounter (Signed)
Called to follow up with patient.  Pt states her eye has improved and would like to hold off on the referral, at least until the beginning of next week.  Pt says she will call back if referral is needed.

## 2016-02-12 ENCOUNTER — Telehealth: Payer: Self-pay | Admitting: Family Medicine

## 2016-02-12 DIAGNOSIS — H00019 Hordeolum externum unspecified eye, unspecified eyelid: Secondary | ICD-10-CM

## 2016-02-12 NOTE — Telephone Encounter (Signed)
Prev note recommended a referral. Ref has been placed.     KP

## 2016-02-12 NOTE — Telephone Encounter (Signed)
Pt called in to request a referral to a optomologist. She says that she was advised by PCP to call in if sty on eye isn't better for referral.   CB: [phone]

## 2016-02-29 ENCOUNTER — Other Ambulatory Visit: Payer: Self-pay | Admitting: Family Medicine

## 2016-02-29 NOTE — Telephone Encounter (Signed)
Patient requesting refill for ambien 5mg  #10 with 3 refills Last seen 01/09/16  No UDS  Last filled 08/22/15 with #10 and 3 rf.   Please advise?

## 2016-04-10 ENCOUNTER — Other Ambulatory Visit: Payer: Self-pay | Admitting: Family Medicine

## 2016-04-11 NOTE — Telephone Encounter (Signed)
Last Alprazolam Rx: 01/15/16, #30 Last OV: 01/09/16 Next OV: UDS: none  Rx printed and forwarded to PCP for signature.

## 2016-05-12 ENCOUNTER — Other Ambulatory Visit: Payer: Self-pay | Admitting: Family Medicine

## 2016-05-13 NOTE — Telephone Encounter (Signed)
Rx approved and faxed to the pharmacy.  Confirmation received.//AB/CMA 

## 2016-05-13 NOTE — Telephone Encounter (Signed)
Requesting Alprazolam 0.25mg -Take 1 tablet by mouth 3 times daily as needed for anxiety. Last refill:04/11/16;#30,0 Last OV:01/09/16 No UDS Please advise.//AB/CMA

## 2016-07-30 ENCOUNTER — Other Ambulatory Visit: Payer: Self-pay | Admitting: Family Medicine

## 2016-08-03 ENCOUNTER — Other Ambulatory Visit: Payer: Self-pay | Admitting: Family Medicine

## 2016-08-05 NOTE — Telephone Encounter (Signed)
Last seen 01/09/16 and filled 05/13/16 #30 no rf Contract signed but no UDS [place] patient  Please advise   KP

## 2016-08-23 ENCOUNTER — Other Ambulatory Visit: Payer: Self-pay | Admitting: Family Medicine

## 2016-08-23 DIAGNOSIS — F411 Generalized anxiety disorder: Secondary | ICD-10-CM

## 2016-08-28 ENCOUNTER — Ambulatory Visit (INDEPENDENT_AMBULATORY_CARE_PROVIDER_SITE_OTHER): Payer: BC Managed Care – PPO | Admitting: Psychology

## 2016-08-28 DIAGNOSIS — F4323 Adjustment disorder with mixed anxiety and depressed mood: Secondary | ICD-10-CM

## 2016-09-03 ENCOUNTER — Ambulatory Visit: Payer: BC Managed Care – PPO | Admitting: Psychology

## 2016-09-06 ENCOUNTER — Encounter: Payer: Self-pay | Admitting: Family Medicine

## 2016-09-06 ENCOUNTER — Ambulatory Visit (INDEPENDENT_AMBULATORY_CARE_PROVIDER_SITE_OTHER): Payer: BC Managed Care – PPO | Admitting: Family Medicine

## 2016-09-06 VITALS — BP 96/60 | HR 64 | Temp 98.6°F | Ht 64.5 in | Wt 117.6 lb

## 2016-09-06 DIAGNOSIS — L719 Rosacea, unspecified: Secondary | ICD-10-CM

## 2016-09-06 MED ORDER — BRIMONIDINE TARTRATE 0.33 % EX GEL: CUTANEOUS | 1 refills | Status: DC

## 2016-09-06 NOTE — Patient Instructions (Signed)
1% hydrocortisone cream is available over the counter. Use it twice daily on the affected areas on your face for 10-14 days.

## 2016-09-06 NOTE — Progress Notes (Signed)
Pre visit review using our clinic review tool, if applicable. No additional management support is needed unless otherwise documented below in the visit note. 

## 2016-09-06 NOTE — Progress Notes (Signed)
Chief Complaint  Patient presents with  . Rash    on face since June    Subjective: Patient is a 55 y.o. female here for a rash on her face since June.  Hx of rosacea on cheeks, had been using metronidazole gel on it, helped a little. Tried OTC fungal medicine that did not help. It does not itch/hurt, will wax and wane in severity. Does not notice any triggers or other forms of relief. Denies new soaps, lotions, topicals, detergents, recent travel, sick contacts, or medication change.  ROS: Skin: As noted in HPI  Family History  Problem Relation Age of Onset  . Heart disease Father     pacer  . Cancer Father     dermatologic  . Cancer Maternal Aunt     uterine  . Diabetes Neg Hx   . Stroke Neg Hx   . Hypertension Neg Hx   . Alzheimer's disease Maternal Aunt   . Alzheimer's disease Mother    Past Medical History:  Diagnosis Date  . Allergy    feline  . Anxiety    No Known Allergies  Current Outpatient Prescriptions:  .  acetaminophen (TYLENOL) 500 MG tablet, Take 500 mg by mouth every 6 (six) hours as needed., Disp: , Rfl:  .  acyclovir (ZOVIRAX) 200 MG capsule, TAKE ONE CAPSULE BY MOUTH THREE TIMES DAILY, Disp: 30 capsule, Rfl: 2 .  ALPRAZolam (XANAX) 0.25 MG tablet, TAKE 1 TABLET BY MOUTH 3 TIMES A DAY AS NEEDED FOR ANXIETY, Disp: 30 tablet, Rfl: 0 .  cetirizine (ZYRTEC) 10 MG tablet, Take 10 mg by mouth daily., Disp: , Rfl:  .  cetirizine (ZYRTEC) 10 MG tablet, Take 10 mg by mouth daily., Disp: , Rfl:  .  citalopram (CELEXA) 10 MG tablet, Take 1 tablet (10 mg total) by mouth daily. Office visit due now, Disp: 90 tablet, Rfl: 0 .  MIMVEY 1-0.5 MG tablet, Take 1 tablet by mouth daily., Disp: , Rfl:  .  zolpidem (AMBIEN) 5 MG tablet, TAKE 1 TABLET BY MOUTH EVERY 3 NIGHTS AS NEEDED, Disp: 10 tablet, Rfl: 3 .  Brimonidine Tartrate 0.33 % GEL, Apply thin layer over affected areas daily., Disp: 1 Tube, Rfl: 1 .  ESTRACE VAGINAL 0.1 MG/GM vaginal cream, , Disp: , Rfl:  .   fluticasone (FLONASE) 50 MCG/ACT nasal spray, Place 2 sprays into both nostrils daily. (Patient not taking: Reported on 09/06/2016), Disp: 16 g, Rfl: 6 .  Nutritional Supplements (HRT SUPPORT PO), Take by mouth., Disp: , Rfl:   Objective: BP 96/60 (BP Location: Left Arm, Patient Position: Sitting, Cuff Size: Normal)   Pulse 64   Temp 98.6 F (37 C) (Oral)   Ht 5' 4.5" (1.638 m)   Wt 117 lb 9.6 oz (53.3 kg)   SpO2 98%   BMI 19.87 kg/m  General: Awake, appears stated age Heart: RRR, no murmurs Lungs: CTAB, no rales, wheezes or rhonchi. No accessory muscle use Skin: Cheeks with areas of erythema and telangiectasias b/l, no warmth or TTP, periorally, there are patches and macules of erythema with mild scaling, no TTP, drainage or warmth Psych: Age appropriate judgment and insight, normal affect and mood  Assessment and Plan: Rosacea - Plan: Brimonidine Tartrate 0.33 % GEL  Orders as above. See AVS for topical steroid rec. Could be angular cheilitis/dermatitis/atopic? If steroid does not help, try rosacea tx, if this is too expensive, can reorder metrogel 0.75%. Fu prn. The patient voiced understanding and agreement to the plan.  [place], DO 09/06/16  3:47 PM

## 2016-09-09 ENCOUNTER — Telehealth: Payer: Self-pay

## 2016-09-09 NOTE — Telephone Encounter (Signed)
I have started a PA request on 09/09/16 for Mirvaso 0.33% gel. Case id [id number] Key : [id number]. Request has been approved currently waiting for verification email. TL/CMA

## 2016-09-10 NOTE — Telephone Encounter (Signed)
We have received faxed conffirmation that Rx has been approved through 09/09/2016-11/13-2017. Sent to the front to be scanned. TL/CMA

## 2016-09-18 ENCOUNTER — Ambulatory Visit (INDEPENDENT_AMBULATORY_CARE_PROVIDER_SITE_OTHER): Payer: BC Managed Care – PPO | Admitting: Psychology

## 2016-09-18 DIAGNOSIS — F4321 Adjustment disorder with depressed mood: Secondary | ICD-10-CM | POA: Diagnosis not present

## 2016-09-27 ENCOUNTER — Other Ambulatory Visit: Payer: Self-pay

## 2016-09-27 ENCOUNTER — Encounter: Payer: Self-pay | Admitting: Family Medicine

## 2016-09-27 ENCOUNTER — Ambulatory Visit (INDEPENDENT_AMBULATORY_CARE_PROVIDER_SITE_OTHER): Payer: BC Managed Care – PPO | Admitting: Family Medicine

## 2016-09-27 VITALS — BP 105/61 | HR 64 | Temp 98.5°F | Ht 64.5 in

## 2016-09-27 DIAGNOSIS — J0110 Acute frontal sinusitis, unspecified: Secondary | ICD-10-CM | POA: Diagnosis not present

## 2016-09-27 MED ORDER — ACYCLOVIR 200 MG PO CAPS: 200.0000 mg | ORAL_CAPSULE | Freq: Three times a day (TID) | ORAL | 2 refills | Status: DC

## 2016-09-27 MED ORDER — BENZONATATE 100 MG PO CAPS: 100.0000 mg | ORAL_CAPSULE | Freq: Three times a day (TID) | ORAL | 0 refills | Status: DC | PRN

## 2016-09-27 NOTE — Progress Notes (Signed)
Chief Complaint  Patient presents with  . Sinusitis    Adriana Lopez here for URI complaints.  Duration: 5 days  Associated symptoms: sinus congestion, sinus pain, rhinorrhea and cough Denies: subjective fever, ear pain, ear drainage, sore throat and shortness of breath Treatment to date: Mucinex, Tylenol, Nyquil, Vit C Sick contacts: Yes- works with school  ROS:  Const: Denies fevers HEENT: As noted in HPI Lungs: No SOB, +cough  Past Medical History:  Diagnosis Date  . Allergy    feline  . Anxiety    Family History  Problem Relation Age of Onset  . Heart disease Father     pacer  . Cancer Father     dermatologic  . Cancer Maternal Aunt     uterine  . Diabetes Neg Hx   . Stroke Neg Hx   . Hypertension Neg Hx   . Alzheimer's disease Maternal Aunt   . Alzheimer's disease Mother     BP 105/61   Pulse 64   Temp 98.5 F (36.9 C) (Oral)   Ht 5' 4.5" (1.638 m)   SpO2 100%  General: Awake, alert, appears stated age 55: AT, [place], ears patent b/l and TM's neg, nares patent w/o discharge, mild TTP over the Frontal sinuses, worse on the right, pharynx pink and without exudates, MMM Neck: No masses or asymmetry Heart: RRR, no murmurs, no bruits Lungs: CTAB, no accessory muscle use Psych: Age appropriate judgment and insight, normal mood and affect  Acute frontal sinusitis, recurrence not specified - Plan: benzonatate (TESSALON) 100 MG capsule  Orders as above.  Benzonatate for the cough, recommend continuing Zyrtec and starting Flonase. Discussed current guidelines for sinusitis treatment with antibiotics. F/u in 1 week if symptoms worsen or fail to improve. Pt voiced understanding and agreement to the plan.  [place], DO 09/27/16 4:55 PM

## 2016-09-27 NOTE — Progress Notes (Signed)
Err

## 2016-09-27 NOTE — Patient Instructions (Addendum)
Flonase (fluticasone); nasal spray that is over the counter. 2 sprays each nostril, once daily. Aim towards the same side eye when you spray.

## 2016-10-11 ENCOUNTER — Ambulatory Visit (INDEPENDENT_AMBULATORY_CARE_PROVIDER_SITE_OTHER): Payer: BC Managed Care – PPO | Admitting: Psychology

## 2016-10-11 DIAGNOSIS — F4321 Adjustment disorder with depressed mood: Secondary | ICD-10-CM

## 2016-10-17 ENCOUNTER — Ambulatory Visit: Payer: BC Managed Care – PPO | Admitting: Psychology

## 2016-11-18 ENCOUNTER — Other Ambulatory Visit: Payer: Self-pay | Admitting: Family Medicine

## 2016-11-18 ENCOUNTER — Ambulatory Visit (INDEPENDENT_AMBULATORY_CARE_PROVIDER_SITE_OTHER): Payer: BC Managed Care – PPO | Admitting: Psychology

## 2016-11-18 DIAGNOSIS — F4321 Adjustment disorder with depressed mood: Secondary | ICD-10-CM | POA: Diagnosis not present

## 2016-11-18 NOTE — Telephone Encounter (Signed)
Last office visit 01/09/2016 Last refill  #10 with 0 refills on 03/01/2016

## 2016-11-18 NOTE — Telephone Encounter (Signed)
Faxed hardcopy for zolpidem  To [facility] in [place] on [address].

## 2016-11-19 ENCOUNTER — Other Ambulatory Visit: Payer: Self-pay | Admitting: Family Medicine

## 2016-11-19 DIAGNOSIS — F411 Generalized anxiety disorder: Secondary | ICD-10-CM

## 2016-11-22 ENCOUNTER — Ambulatory Visit (INDEPENDENT_AMBULATORY_CARE_PROVIDER_SITE_OTHER): Payer: BC Managed Care – PPO | Admitting: Family Medicine

## 2016-11-22 ENCOUNTER — Encounter: Payer: Self-pay | Admitting: Family Medicine

## 2016-11-22 VITALS — BP 100/64 | HR 72 | Temp 98.7°F | Resp 16 | Ht 65.0 in | Wt 119.8 lb

## 2016-11-22 DIAGNOSIS — J324 Chronic pansinusitis: Secondary | ICD-10-CM

## 2016-11-22 DIAGNOSIS — J0140 Acute pansinusitis, unspecified: Secondary | ICD-10-CM | POA: Diagnosis not present

## 2016-11-22 DIAGNOSIS — E2839 Other primary ovarian failure: Secondary | ICD-10-CM | POA: Diagnosis not present

## 2016-11-22 MED ORDER — PREDNISONE 10 MG PO TABS: ORAL_TABLET | ORAL | 0 refills | Status: DC

## 2016-11-22 MED ORDER — CEFDINIR 300 MG PO CAPS: 300.0000 mg | ORAL_CAPSULE | Freq: Two times a day (BID) | ORAL | 0 refills | Status: DC

## 2016-11-22 NOTE — Progress Notes (Signed)
Subjective:    Patient ID: Adriana Lopez, female    DOB: October 23, 1961, 56 y.o.   MRN: [id number]  Chief Complaint  Patient presents with  . Acute Visit    pt c/o pain and pressure over eyes, stuffy nose, and cough for couple weeks.    HPI Patient is in today for cough, stuffy nose, pain and pressure above eyes.  Pt is using flonase And certrizine daily no relief.  Pt was seen 12/ 1 and it did improve but symptoms came right back after being better for 3-4 weeks.  Symptoms returned about 3-4 weeks ago.    Past Medical History:  Diagnosis Date  . Allergy    feline  . Anxiety     Past Surgical History:  Procedure Laterality Date  . REFRACTIVE SURGERY  2012   Dr Lucita Ferrara  . TONSILLECTOMY AND ADENOIDECTOMY      Family History  Problem Relation Age of Onset  . Heart disease Father     pacer  . Cancer Father     dermatologic  . Cancer Maternal Aunt     uterine  . Diabetes Neg Hx   . Stroke Neg Hx   . Hypertension Neg Hx   . Alzheimer's disease Maternal Aunt   . Alzheimer's disease Mother     Social History   Social History  . Marital status: Married    Spouse name: N/A  . Number of children: N/A  . Years of education: N/A   Occupational History  . Not on file.   Social History Main Topics  . Smoking status: Never Smoker  . Smokeless tobacco: Never Used  . Alcohol use 4.2 oz/week    7 Glasses of wine per week  . Drug use: No  . Sexual activity: Yes   Other Topics Concern  . Not on file   Social History Narrative  . No narrative on file    Outpatient Medications Prior to Visit  Medication Sig Dispense Refill  . acetaminophen (TYLENOL) 500 MG tablet Take 500 mg by mouth every 6 (six) hours as needed.    Marland Kitchen acyclovir (ZOVIRAX) 200 MG capsule Take 1 capsule (200 mg total) by mouth 3 (three) times daily. 30 capsule 2  . ALPRAZolam (XANAX) 0.25 MG tablet TAKE 1 TABLET BY MOUTH 3 TIMES A DAY AS NEEDED FOR ANXIETY 30 tablet 0  . Brimonidine Tartrate  0.33 % GEL Apply thin layer over affected areas daily. 1 Tube 1  . cetirizine (ZYRTEC) 10 MG tablet Take 10 mg by mouth daily.    . cetirizine (ZYRTEC) 10 MG tablet Take 10 mg by mouth daily.    . citalopram (CELEXA) 10 MG tablet TAKE 1 TABLET (10 MG TOTAL) BY MOUTH DAILY. OFFICE VISIT DUE NOW 90 tablet 0  . fluticasone (FLONASE) 50 MCG/ACT nasal spray Place 2 sprays into both nostrils daily. 16 g 6  . MIMVEY 1-0.5 MG tablet Take 1 tablet by mouth daily.    . Nutritional Supplements (HRT SUPPORT PO) Take by mouth.    . zolpidem (AMBIEN) 5 MG tablet TAKE 1 TABLET BY MOUTH EVERY 3 NIGHTS AS NEEDED 10 tablet 0  . benzonatate (TESSALON) 100 MG capsule Take 1 capsule (100 mg total) by mouth 3 (three) times daily as needed for cough. (Patient not taking: Reported on 11/22/2016) 20 capsule 0   No facility-administered medications prior to visit.     No Known Allergies  Review of Systems  Constitutional: Negative for fever.  HENT:  Positive for sinus pain. Negative for congestion.   Eyes: Negative for blurred vision.  Respiratory: Positive for cough.   Cardiovascular: Negative for chest pain and palpitations.  Gastrointestinal: Negative for vomiting.  Musculoskeletal: Negative.  Negative for back pain.  Skin: Negative for rash.  Neurological: Negative for loss of consciousness and headaches.  Psychiatric/Behavioral: Negative.        Objective:    Physical Exam  Constitutional: She appears well-developed and well-nourished. No distress.  HENT:  Head: Normocephalic and atraumatic.  Right Ear: Tympanic membrane normal.  Left Ear: Tympanic membrane normal.  Nose: Mucosal edema and rhinorrhea present. Right sinus exhibits maxillary sinus tenderness and frontal sinus tenderness. Left sinus exhibits maxillary sinus tenderness and frontal sinus tenderness.  Mouth/Throat: Uvula is midline and mucous membranes are normal. Posterior oropharyngeal erythema present. No oropharyngeal exudate.  Eyes:  Conjunctivae and EOM are normal. Pupils are equal, round, and reactive to light.  Neck: Normal range of motion. Neck supple.  Cardiovascular: Normal rate, regular rhythm and normal heart sounds.   Pulmonary/Chest: Effort normal and breath sounds normal. No respiratory distress. She has no wheezes.  Lymphadenopathy:    She has no cervical adenopathy.  Nursing note and vitals reviewed.   BP 100/64 (BP Location: Left Arm, Patient Position: Sitting, Cuff Size: Normal)   Pulse 72   Temp 98.7 F (37.1 C) (Oral)   Resp 16   Ht 5\' 5"  (1.651 m)   Wt 119 lb 12.8 oz (54.3 kg)   SpO2 98%   BMI 19.94 kg/m  Wt Readings from Last 3 Encounters:  11/22/16 119 lb 12.8 oz (54.3 kg)  09/06/16 117 lb 9.6 oz (53.3 kg)  01/09/16 116 lb 3.2 oz (52.7 kg)     Lab Results  Component Value Date   WBC 9.7 08/22/2015   HGB 13.6 08/22/2015   HCT 40.6 08/22/2015   PLT 353.0 08/22/2015   GLUCOSE 93 08/22/2015   CHOL 168 08/22/2015   TRIG 59.0 08/22/2015   HDL 91.40 08/22/2015   LDLCALC 65 08/22/2015   ALT 11 08/22/2015   AST 19 08/22/2015   NA 137 08/22/2015   K 3.9 08/22/2015   CL 100 08/22/2015   CREATININE 0.73 08/22/2015   BUN 14 08/22/2015   CO2 27 08/22/2015   TSH 3.33 08/22/2015    Lab Results  Component Value Date   TSH 3.33 08/22/2015   Lab Results  Component Value Date   WBC 9.7 08/22/2015   HGB 13.6 08/22/2015   HCT 40.6 08/22/2015   MCV 99.5 08/22/2015   PLT 353.0 08/22/2015   Lab Results  Component Value Date   NA 137 08/22/2015   K 3.9 08/22/2015   CO2 27 08/22/2015   GLUCOSE 93 08/22/2015   BUN 14 08/22/2015   CREATININE 0.73 08/22/2015   BILITOT 1.1 08/22/2015   ALKPHOS 41 08/22/2015   AST 19 08/22/2015   ALT 11 08/22/2015   PROT 7.1 08/22/2015   ALBUMIN 4.4 08/22/2015   CALCIUM 9.3 08/22/2015   GFR 87.88 08/22/2015   Lab Results  Component Value Date   CHOL 168 08/22/2015   Lab Results  Component Value Date   HDL 91.40 08/22/2015   Lab Results    Component Value Date   LDLCALC 65 08/22/2015   Lab Results  Component Value Date   TRIG 59.0 08/22/2015   Lab Results  Component Value Date   CHOLHDL 2 08/22/2015   No results found for: HGBA1C     Assessment &  Plan:   Problem List Items Addressed This Visit    None    Visit Diagnoses    Estrogen deficiency    -  Primary   Relevant Orders   DG Bone Density   Chronic pansinusitis       Relevant Medications   cefdinir (OMNICEF) 300 MG capsule   predniSONE (DELTASONE) 10 MG tablet   Acute pansinusitis, recurrence not specified       Relevant Medications   cefdinir (OMNICEF) 300 MG capsule   predniSONE (DELTASONE) 10 MG tablet      I am having Ms. Jantz start on cefdinir and predniSONE. I am also having her maintain her cetirizine, Nutritional Supplements (HRT SUPPORT PO), acetaminophen, fluticasone, ALPRAZolam, cetirizine, MIMVEY, Brimonidine Tartrate, acyclovir, benzonatate, zolpidem, and citalopram.  Meds ordered this encounter  Medications  . cefdinir (OMNICEF) 300 MG capsule    Sig: Take 1 capsule (300 mg total) by mouth 2 (two) times daily.    Dispense:  20 capsule    Refill:  0  . predniSONE (DELTASONE) 10 MG tablet    Sig: 3 po qd x 3 then 2 po qd x 3 days then 1 po qd x3d    Dispense:  18 tablet    Refill:  0    CMA served as scribe during this visit. History, Physical and Plan performed by medical provider. Documentation and orders reviewed and attested to.   Ann Held, DO

## 2016-11-22 NOTE — Patient Instructions (Signed)

## 2016-11-22 NOTE — Progress Notes (Signed)
Pre visit review using our clinic review tool, if applicable. No additional management support is needed unless otherwise documented below in the visit note. 

## 2016-12-04 ENCOUNTER — Ambulatory Visit (INDEPENDENT_AMBULATORY_CARE_PROVIDER_SITE_OTHER): Payer: BC Managed Care – PPO | Admitting: Psychology

## 2016-12-04 DIAGNOSIS — F4321 Adjustment disorder with depressed mood: Secondary | ICD-10-CM | POA: Diagnosis not present

## 2016-12-05 ENCOUNTER — Other Ambulatory Visit: Payer: Self-pay | Admitting: Family Medicine

## 2016-12-05 NOTE — Telephone Encounter (Signed)
Last ov 11/18/16 for 10tabs Last ov 11/22/16 Next ov 03/25/17 No contract or UDS

## 2016-12-06 NOTE — Telephone Encounter (Signed)
rx faxed to [facility] [address] 

## 2016-12-24 ENCOUNTER — Ambulatory Visit (INDEPENDENT_AMBULATORY_CARE_PROVIDER_SITE_OTHER): Payer: BC Managed Care – PPO | Admitting: Psychology

## 2016-12-24 DIAGNOSIS — F4321 Adjustment disorder with depressed mood: Secondary | ICD-10-CM

## 2017-01-16 ENCOUNTER — Other Ambulatory Visit: Payer: Self-pay | Admitting: Family Medicine

## 2017-01-17 ENCOUNTER — Ambulatory Visit (INDEPENDENT_AMBULATORY_CARE_PROVIDER_SITE_OTHER): Payer: BC Managed Care – PPO | Admitting: Psychology

## 2017-01-17 DIAGNOSIS — F4321 Adjustment disorder with depressed mood: Secondary | ICD-10-CM | POA: Diagnosis not present

## 2017-01-30 ENCOUNTER — Other Ambulatory Visit: Payer: Self-pay | Admitting: Family Medicine

## 2017-01-30 MED ORDER — ALPRAZOLAM 0.25 MG PO TABS: 0.2500 mg | ORAL_TABLET | Freq: Three times a day (TID) | ORAL | 0 refills | Status: DC

## 2017-01-30 NOTE — Addendum Note (Signed)
Addended by: Sharen Counter D on: 01/30/2017 09:31 AM   Modules accepted: Orders

## 2017-01-30 NOTE — Telephone Encounter (Signed)
Received Rx request for Xanax 0.25mg  (take 1 tab po TID prn for anxiety); #30  Last RF: 08/05/2016 Last OV: 11/22/2016 Next OV: 03/25/2017 UDS: None  Forwarded to Provider for review, approval or denial.

## 2017-01-30 NOTE — Telephone Encounter (Signed)
Hard copy faxed to [facility] [place], [place]): [phone]

## 2017-02-15 ENCOUNTER — Other Ambulatory Visit: Payer: Self-pay | Admitting: Family Medicine

## 2017-02-15 DIAGNOSIS — F411 Generalized anxiety disorder: Secondary | ICD-10-CM

## 2017-02-18 ENCOUNTER — Ambulatory Visit: Payer: BC Managed Care – PPO | Admitting: Psychology

## 2017-02-26 ENCOUNTER — Telehealth: Payer: Self-pay | Admitting: Medical

## 2017-02-26 ENCOUNTER — Ambulatory Visit (INDEPENDENT_AMBULATORY_CARE_PROVIDER_SITE_OTHER): Payer: BC Managed Care – PPO | Admitting: Medical

## 2017-02-26 VITALS — BP 113/60 | HR 62 | Temp 98.1°F | Resp 16 | Ht 64.0 in | Wt 117.0 lb

## 2017-02-26 DIAGNOSIS — S30860A Insect bite (nonvenomous) of lower back and pelvis, initial encounter: Secondary | ICD-10-CM | POA: Diagnosis not present

## 2017-02-26 DIAGNOSIS — W57XXXA Bitten or stung by nonvenomous insect and other nonvenomous arthropods, initial encounter: Secondary | ICD-10-CM | POA: Diagnosis not present

## 2017-02-26 MED ORDER — DOXYCYCLINE HYCLATE 100 MG PO TABS: 100.0000 mg | ORAL_TABLET | Freq: Two times a day (BID) | ORAL | 0 refills | Status: DC

## 2017-02-26 NOTE — Telephone Encounter (Signed)
I tried to close chart last night. It says that you have note open. Would you close the note so I can close out chart

## 2017-02-26 NOTE — Progress Notes (Signed)
Pre visit review using our clinic review tool, if applicable. No additional management support is needed unless otherwise documented below in the visit note. 

## 2017-02-26 NOTE — Patient Instructions (Addendum)
For recent tick bite will go ahead and rx doxycycline antibiotic. Please go to lab and get tick bite  studies done.  We will let you know result of the study as they come in.  Follow up as needed

## 2017-02-26 NOTE — Progress Notes (Signed)
Subjective:    Patient ID: Adriana Lopez, female    DOB: 09-08-1961, 56 y.o.   MRN: [id number]  HPI  Pt on Sunday saw a tick that was on her left hip/upper groin area. She pulled tick off and little irritated and raised since then  Working in yard on Saturday. No fever, no chills, no sweat, no body aches, fatigue or rash.  Review of Systems  Constitutional: Negative for chills, fatigue and fever.  Respiratory: Negative for cough, shortness of breath and wheezing.   Cardiovascular: Negative for chest pain and palpitations.  Gastrointestinal: Negative for abdominal pain, diarrhea, nausea and vomiting.  Musculoskeletal: Negative for myalgias and neck stiffness.  Skin:       See hpi/physical exam.    Past Medical History:  Diagnosis Date  . Allergy    feline  . Anxiety      Social History   Social History  . Marital status: Married    Spouse name: N/A  . Number of children: N/A  . Years of education: N/A   Occupational History  . Not on file.   Social History Main Topics  . Smoking status: Never Smoker  . Smokeless tobacco: Never Used  . Alcohol use 4.2 oz/week    7 Glasses of wine per week  . Drug use: No  . Sexual activity: Yes   Other Topics Concern  . Not on file   Social History Narrative  . No narrative on file    Past Surgical History:  Procedure Laterality Date  . REFRACTIVE SURGERY  2012   Dr Lucita Ferrara  . TONSILLECTOMY AND ADENOIDECTOMY      Family History  Problem Relation Age of Onset  . Heart disease Father     pacer  . Cancer Father     dermatologic  . Cancer Maternal Aunt     uterine  . Diabetes Neg Hx   . Stroke Neg Hx   . Hypertension Neg Hx   . Alzheimer's disease Maternal Aunt   . Alzheimer's disease Mother     No Known Allergies  Current Outpatient Prescriptions on File Prior to Visit  Medication Sig Dispense Refill  . acetaminophen (TYLENOL) 500 MG tablet Take 500 mg by mouth every 6 (six) hours as needed.    Marland Kitchen  acyclovir (ZOVIRAX) 200 MG capsule Take 1 capsule (200 mg total) by mouth 3 (three) times daily. 30 capsule 2  . acyclovir (ZOVIRAX) 200 MG capsule TAKE ONE CAPSULE BY MOUTH THREE TIMES DAILY 30 capsule 2  . ALPRAZolam (XANAX) 0.25 MG tablet Take 1 tablet (0.25 mg total) by mouth 3 (three) times daily. 30 tablet 0  . cefdinir (OMNICEF) 300 MG capsule Take 1 capsule (300 mg total) by mouth 2 (two) times daily. 20 capsule 0  . cetirizine (ZYRTEC) 10 MG tablet Take 10 mg by mouth daily.    . cetirizine (ZYRTEC) 10 MG tablet Take 10 mg by mouth daily.    . citalopram (CELEXA) 10 MG tablet TAKE 1 TABLET (10 MG TOTAL) BY MOUTH DAILY. OFFICE VISIT DUE NOW 90 tablet 0  . fluticasone (FLONASE) 50 MCG/ACT nasal spray Place 2 sprays into both nostrils daily. 16 g 6  . Nutritional Supplements (HRT SUPPORT PO) Take by mouth.    . predniSONE (DELTASONE) 10 MG tablet 3 po qd x 3 then 2 po qd x 3 days then 1 po qd x3d 18 tablet 0  . zolpidem (AMBIEN) 5 MG tablet TAKE 1 TABLET BY MOUTH  EVERY 3 NIGHTS AS NEEDED 10 tablet 0   No current facility-administered medications on file prior to visit.     BP 113/60 (BP Location: Left Arm, Patient Position: Sitting, Cuff Size: Normal)   Pulse 62   Temp 98.1 F (36.7 C) (Oral)   Resp 16   Ht 5\' 4"  (1.626 m)   Wt 117 lb (53.1 kg)   SpO2 100%   BMI 20.08 kg/m       Objective:   Physical Exam  General- No acute distress. Pleasant patient. Neck- Full range of motion, no jvd Lungs- Clear, even and unlabored. Heart- regular rate and rhythm. Neurologic- CNII- XII grossly intact.  Skin- rt hip/upper groin area small raised bump where tick was attached. Non tender.       Assessment & Plan:  For recent tick bite will go ahead and rx doxycycline antibiotic. Please go to lab and get tick bite studies done.  We will let you know result of the study as they come in.  Follow up as needed

## 2017-02-27 LAB — LYME ABY, WSTRN BLT IGG & IGM W/BANDS
B BURGDORFERI IGG ABS (IB): NEGATIVE
B BURGDORFERI IGM ABS (IB): NEGATIVE
LYME DISEASE 23 KD IGG: NONREACTIVE
LYME DISEASE 23 KD IGM: NONREACTIVE
LYME DISEASE 39 KD IGM: NONREACTIVE
LYME DISEASE 41 KD IGG: NONREACTIVE
LYME DISEASE 41 KD IGM: NONREACTIVE
LYME DISEASE 93 KD IGG: NONREACTIVE
Lyme Disease 18 kD IgG: NONREACTIVE
Lyme Disease 28 kD IgG: NONREACTIVE
Lyme Disease 30 kD IgG: NONREACTIVE
Lyme Disease 39 kD IgG: NONREACTIVE
Lyme Disease 45 kD IgG: NONREACTIVE
Lyme Disease 58 kD IgG: NONREACTIVE
Lyme Disease 66 kD IgG: NONREACTIVE

## 2017-02-27 LAB — LYME AB/WESTERN BLOT REFLEX

## 2017-02-28 LAB — ROCKY MTN SPOTTED FVR ABS PNL(IGG+IGM)
RMSF IGM: NOT DETECTED
RMSF IgG: NOT DETECTED

## 2017-03-03 ENCOUNTER — Ambulatory Visit (INDEPENDENT_AMBULATORY_CARE_PROVIDER_SITE_OTHER): Payer: BC Managed Care – PPO | Admitting: Psychology

## 2017-03-03 DIAGNOSIS — F4321 Adjustment disorder with depressed mood: Secondary | ICD-10-CM | POA: Diagnosis not present

## 2017-03-06 ENCOUNTER — Ambulatory Visit (HOSPITAL_BASED_OUTPATIENT_CLINIC_OR_DEPARTMENT_OTHER)
Admission: RE | Admit: 2017-03-06 | Discharge: 2017-03-06 | Disposition: A | Discharge status: Home / Self Care | Payer: BC Managed Care – PPO | Source: Ambulatory Visit | Attending: Family Medicine | Admitting: Family Medicine

## 2017-03-06 DIAGNOSIS — M85861 Other specified disorders of bone density and structure, right lower leg: Secondary | ICD-10-CM | POA: Diagnosis not present

## 2017-03-06 DIAGNOSIS — E2839 Other primary ovarian failure: Secondary | ICD-10-CM

## 2017-03-25 ENCOUNTER — Encounter: Payer: Self-pay | Admitting: Family Medicine

## 2017-03-25 ENCOUNTER — Ambulatory Visit (INDEPENDENT_AMBULATORY_CARE_PROVIDER_SITE_OTHER): Payer: BC Managed Care – PPO | Admitting: Psychology

## 2017-03-25 ENCOUNTER — Ambulatory Visit (INDEPENDENT_AMBULATORY_CARE_PROVIDER_SITE_OTHER): Payer: BC Managed Care – PPO | Admitting: Family Medicine

## 2017-03-25 VITALS — BP 110/66 | HR 61 | Temp 98.3°F | Resp 16 | Ht 64.0 in | Wt 117.4 lb

## 2017-03-25 DIAGNOSIS — F4321 Adjustment disorder with depressed mood: Secondary | ICD-10-CM | POA: Diagnosis not present

## 2017-03-25 DIAGNOSIS — G4700 Insomnia, unspecified: Secondary | ICD-10-CM

## 2017-03-25 DIAGNOSIS — R319 Hematuria, unspecified: Secondary | ICD-10-CM | POA: Diagnosis not present

## 2017-03-25 DIAGNOSIS — F411 Generalized anxiety disorder: Secondary | ICD-10-CM

## 2017-03-25 DIAGNOSIS — Z0000 Encounter for general adult medical examination without abnormal findings: Secondary | ICD-10-CM

## 2017-03-25 DIAGNOSIS — M79641 Pain in right hand: Secondary | ICD-10-CM

## 2017-03-25 DIAGNOSIS — Z1211 Encounter for screening for malignant neoplasm of colon: Secondary | ICD-10-CM

## 2017-03-25 LAB — POC URINALSYSI DIPSTICK (AUTOMATED)
BILIRUBIN UA: NEGATIVE
GLUCOSE UA: NEGATIVE
Leukocytes, UA: NEGATIVE
NITRITE UA: NEGATIVE
Protein, UA: NEGATIVE
Spec Grav, UA: >=1.03 — AB (ref 1.010–1.025)
Urobilinogen, UA: 0.2 E.U./dL
pH, UA: 6 (ref 5.0–8.0)

## 2017-03-25 MED ORDER — ZOLPIDEM TARTRATE 5 MG PO TABS: ORAL_TABLET | ORAL | 0 refills | Status: DC

## 2017-03-25 NOTE — Assessment & Plan Note (Signed)
And increased stress con't with counseling Pt does not want to inc celexa

## 2017-03-25 NOTE — Progress Notes (Signed)
Subjective:     Adriana Lopez is a 56 y.o. female and is here for a comprehensive physical exam. The patient reports no problems.  Social History   Social History  . Marital status: Married    Spouse name: N/A  . Number of children: N/A  . Years of education: N/A   Occupational History  . Not on file.   Social History Main Topics  . Smoking status: Former Smoker    Types: Cigarettes  . Smokeless tobacco: Never Used  . Alcohol use 4.2 oz/week    7 Glasses of wine per week  . Drug use: No  . Sexual activity: Yes   Other Topics Concern  . Not on file   Social History Narrative   Exercise- walking      Health Maintenance  Topic Date Due  . COLONOSCOPY  05/08/2010  . MAMMOGRAM  11/27/2015  . PAP SMEAR  08/28/2016  . INFLUENZA VACCINE  05/28/2017  . TETANUS/TDAP  03/09/2019  . Hepatitis C Screening  Completed  . HIV Screening  Completed    The following portions of the patient's history were reviewed and updated as appropriate:  She  has a past medical history of Allergy and Anxiety. She  does not have any pertinent problems on file. She  has a past surgical history that includes Tonsillectomy and adenoidectomy; Refractive surgery (2012); and Skin cancer excision. Her family history includes Alzheimer's disease in her maternal aunt and mother; Cancer in her father and maternal aunt; Heart disease in her father. She  reports that she has quit smoking. Her smoking use included Cigarettes. She has never used smokeless tobacco. She reports that she drinks about 4.2 oz of alcohol per week . She reports that she does not use drugs. She has a current medication list which includes the following prescription(s): acetaminophen, acyclovir, acyclovir, alprazolam, cetirizine, cetirizine, citalopram, estradiol, fluticasone, nutritional supplements, progesterone, and zolpidem. Current Outpatient Prescriptions on File Prior to Visit  Medication Sig Dispense Refill   . acetaminophen (TYLENOL) 500 MG tablet Take 500 mg by mouth every 6 (six) hours as needed.    Marland Kitchen acyclovir (ZOVIRAX) 200 MG capsule Take 1 capsule (200 mg total) by mouth 3 (three) times daily. 30 capsule 2  . acyclovir (ZOVIRAX) 200 MG capsule TAKE ONE CAPSULE BY MOUTH THREE TIMES DAILY 30 capsule 2  . ALPRAZolam (XANAX) 0.25 MG tablet Take 1 tablet (0.25 mg total) by mouth 3 (three) times daily. 30 tablet 0  . cetirizine (ZYRTEC) 10 MG tablet Take 10 mg by mouth daily.    . cetirizine (ZYRTEC) 10 MG tablet Take 10 mg by mouth daily.    . citalopram (CELEXA) 10 MG tablet TAKE 1 TABLET (10 MG TOTAL) BY MOUTH DAILY. OFFICE VISIT DUE NOW 90 tablet 0  . fluticasone (FLONASE) 50 MCG/ACT nasal spray Place 2 sprays into both nostrils daily. 16 g 6  . Nutritional Supplements (HRT SUPPORT PO) Take by mouth.     No current facility-administered medications on file prior to visit.    She has No Known Allergies..  Review of Systems Review of Systems  Constitutional: Negative for activity change, appetite change and fatigue.  HENT: Negative for hearing loss, congestion, tinnitus and ear discharge.  dentist q53m Eyes: Negative for visual disturbance (see optho q1y -- vision corrected to 20/20 with glasses).  Respiratory: Negative for cough, chest tightness and shortness of breath.   Cardiovascular: Negative for chest pain, palpitations  and leg swelling.  Gastrointestinal: Negative for abdominal pain, diarrhea, constipation and abdominal distention.  Genitourinary: Negative for urgency, frequency, decreased urine volume and difficulty urinating.  Musculoskeletal: Negative for back pain, arthralgias and gait problem.  Skin: Negative for color change, pallor and rash.  Neurological: Negative for dizziness, light-headedness, numbness and headaches.  Hematological: Negative for adenopathy. Does not bruise/bleed easily.  Psychiatric/Behavioral: Negative for suicidal ideas, confusion, sleep disturbance,  self-injury, dysphoric mood, decreased concentration and agitation.       Objective:    BP 110/66 (BP Location: Left Arm, Cuff Size: Normal)   Pulse 61   Temp 98.3 F (36.8 C) (Oral)   Resp 16   Ht 5\' 4"  (1.626 m)   Wt 117 lb 6.4 oz (53.3 kg)   SpO2 98%   BMI 20.15 kg/m  General appearance: alert, cooperative, appears stated age and no distress Head: Normocephalic, without obvious abnormality, atraumatic Eyes: conjunctivae/corneas clear. PERRL, EOM's intact. Fundi benign. Ears: normal TM's and external ear canals both ears Nose: Nares normal. Septum midline. Mucosa normal. No drainage or sinus tenderness. Throat: lips, mucosa, and tongue normal; teeth and gums normal Neck: no adenopathy, no carotid bruit, no JVD, supple, symmetrical, trachea midline and thyroid not enlarged, symmetric, no tenderness/mass/nodules Back: symmetric, no curvature. ROM normal. No CVA tenderness. Lungs: clear to auscultation bilaterally Breasts: gyn Heart: regular rate and rhythm, S1, S2 normal, no murmur, click, rub or gallop Abdomen: soft, non-tender; bowel sounds normal; no masses,  no organomegaly Pelvic: deferred --gyn Extremities: extremities normal, atraumatic, no cyanosis or edema Pulses: 2+ and symmetric Skin: Skin color, texture, turgor normal. No rashes or lesions Lymph nodes: Cervical, supraclavicular, and axillary nodes normal. Neurologic: Alert and oriented X 3, normal strength and tone. Normal symmetric reflexes. Normal coordination and gait    Assessment:    Healthy female exam.      Plan:    ghm utd  See After Visit Summary for Counseling Recommendations    1. Preventative health care See above - POCT Urinalysis Dipstick (Automated) - CBC with Differential/Platelet - Comprehensive metabolic panel - Lipid panel - TSH - Ambulatory referral to Gastroenterology  2. Colon cancer screening Refer to GI  3. Hand pain, right Check labs-- consider hand specialist if pain  worsens - Rheumatoid Factor  4. Insomnia, unspecified type  - zolpidem (AMBIEN) 5 MG tablet; TAKE 1 TABLET BY MOUTH EVERY 3 NIGHTS AS NEEDED  Dispense: 30 tablet; Refill: 0

## 2017-03-25 NOTE — Patient Instructions (Signed)

## 2017-03-26 LAB — URINE CULTURE

## 2017-03-26 LAB — TSH: TSH: 1.98 u[IU]/mL (ref 0.35–4.50)

## 2017-03-26 LAB — CBC WITH DIFFERENTIAL/PLATELET
BASOS ABS: 0.1 10*3/uL (ref 0.0–0.1)
Basophils Relative: 1 % (ref 0.0–3.0)
Eosinophils Absolute: 0.2 10*3/uL (ref 0.0–0.7)
Eosinophils Relative: 3 % (ref 0.0–5.0)
HEMATOCRIT: 40.4 % (ref 36.0–46.0)
HEMOGLOBIN: 13.6 g/dL (ref 12.0–15.0)
Lymphocytes Relative: 41.3 % (ref 12.0–46.0)
Lymphs Abs: 3.2 10*3/uL (ref 0.7–4.0)
MCHC: 33.7 g/dL (ref 30.0–36.0)
MCV: 100.4 fl — AB (ref 78.0–100.0)
MONOS PCT: 8.4 % (ref 3.0–12.0)
Monocytes Absolute: 0.6 10*3/uL (ref 0.1–1.0)
Neutro Abs: 3.6 10*3/uL (ref 1.4–7.7)
Neutrophils Relative %: 46.3 % (ref 43.0–77.0)
Platelets: 332 10*3/uL (ref 150.0–400.0)
RBC: 4.02 Mil/uL (ref 3.87–5.11)
RDW: 12.8 % (ref 11.5–15.5)
WBC: 7.8 10*3/uL (ref 4.0–10.5)

## 2017-03-26 LAB — COMPREHENSIVE METABOLIC PANEL
ALBUMIN: 4.4 g/dL (ref 3.5–5.2)
ALK PHOS: 45 U/L (ref 39–117)
ALT: 13 U/L (ref 0–35)
AST: 22 U/L (ref 0–37)
BILIRUBIN TOTAL: 0.8 mg/dL (ref 0.2–1.2)
BUN: 19 mg/dL (ref 6–23)
CALCIUM: 9.3 mg/dL (ref 8.4–10.5)
CO2: 29 mEq/L (ref 19–32)
Chloride: 102 mEq/L (ref 96–112)
Creatinine, Ser: 0.72 mg/dL (ref 0.40–1.20)
GFR: 88.78 mL/min (ref >60.00)
GLUCOSE: 81 mg/dL (ref 70–99)
Potassium: 4.1 mEq/L (ref 3.5–5.1)
Sodium: 137 mEq/L (ref 135–145)
TOTAL PROTEIN: 6.8 g/dL (ref 6.0–8.3)

## 2017-03-26 LAB — LIPID PANEL
CHOL/HDL RATIO: 2
Cholesterol: 176 mg/dL (ref 0–200)
HDL: 94.6 mg/dL (ref >39.00)
LDL Cholesterol: 73 mg/dL (ref 0–99)
NONHDL: 81.47
Triglycerides: 42 mg/dL (ref 0.0–149.0)
VLDL: 8.4 mg/dL (ref 0.0–40.0)

## 2017-03-26 LAB — RHEUMATOID FACTOR: Rhuematoid fact SerPl-aCnc: 17 IU/mL — ABNORMAL HIGH (ref <14)

## 2017-03-27 NOTE — Progress Notes (Signed)
Test code for Rheumatoid Factor is correct per [facility] [place]. Kmp

## 2017-03-27 NOTE — Progress Notes (Signed)
I will call the lab and check the order to make sure its the right one, I see what your talking about now. KMP

## 2017-04-11 ENCOUNTER — Ambulatory Visit (INDEPENDENT_AMBULATORY_CARE_PROVIDER_SITE_OTHER): Payer: BC Managed Care – PPO | Admitting: Psychology

## 2017-04-11 DIAGNOSIS — F4321 Adjustment disorder with depressed mood: Secondary | ICD-10-CM

## 2017-04-25 ENCOUNTER — Encounter: Payer: Self-pay | Admitting: Family Medicine

## 2017-05-01 ENCOUNTER — Ambulatory Visit (INDEPENDENT_AMBULATORY_CARE_PROVIDER_SITE_OTHER): Payer: BC Managed Care – PPO | Admitting: Psychology

## 2017-05-01 DIAGNOSIS — F4321 Adjustment disorder with depressed mood: Secondary | ICD-10-CM

## 2017-05-16 ENCOUNTER — Other Ambulatory Visit: Payer: Self-pay | Admitting: Family Medicine

## 2017-05-16 DIAGNOSIS — F411 Generalized anxiety disorder: Secondary | ICD-10-CM

## 2017-05-22 ENCOUNTER — Ambulatory Visit (INDEPENDENT_AMBULATORY_CARE_PROVIDER_SITE_OTHER): Payer: BC Managed Care – PPO | Admitting: Psychology

## 2017-05-22 DIAGNOSIS — F4321 Adjustment disorder with depressed mood: Secondary | ICD-10-CM

## 2017-06-10 ENCOUNTER — Ambulatory Visit (INDEPENDENT_AMBULATORY_CARE_PROVIDER_SITE_OTHER): Payer: BC Managed Care – PPO | Admitting: Psychology

## 2017-06-10 DIAGNOSIS — F4321 Adjustment disorder with depressed mood: Secondary | ICD-10-CM | POA: Diagnosis not present

## 2017-07-07 ENCOUNTER — Ambulatory Visit (INDEPENDENT_AMBULATORY_CARE_PROVIDER_SITE_OTHER): Payer: BC Managed Care – PPO | Admitting: Psychology

## 2017-07-07 DIAGNOSIS — F4321 Adjustment disorder with depressed mood: Secondary | ICD-10-CM

## 2017-08-05 ENCOUNTER — Ambulatory Visit (INDEPENDENT_AMBULATORY_CARE_PROVIDER_SITE_OTHER): Payer: BC Managed Care – PPO | Admitting: Internal Medicine

## 2017-08-05 ENCOUNTER — Encounter: Payer: Self-pay | Admitting: Internal Medicine

## 2017-08-05 VITALS — BP 118/64 | HR 61 | Temp 98.1°F | Resp 14 | Ht 64.0 in | Wt 116.4 lb

## 2017-08-05 DIAGNOSIS — J40 Bronchitis, not specified as acute or chronic: Secondary | ICD-10-CM

## 2017-08-05 MED ORDER — CEFUROXIME AXETIL 500 MG PO TABS: 500.0000 mg | ORAL_TABLET | Freq: Two times a day (BID) | ORAL | 0 refills | Status: DC

## 2017-08-05 MED ORDER — AZELASTINE HCL 0.1 % NA SOLN: 2.0000 | Freq: Every evening | NASAL | 3 refills | Status: DC | PRN

## 2017-08-05 NOTE — Progress Notes (Signed)
Pre visit review using our clinic review tool, if applicable. No additional management support is needed unless otherwise documented below in the visit note. 

## 2017-08-05 NOTE — Progress Notes (Signed)
Subjective:    Patient ID: Adriana Lopez, female    DOB: Aug 13, 1961, 56 y.o.   MRN: [id number]  DOS:  08/05/2017 Type of visit - description : acute  Interval history: Symptoms started approximately 4 days ago: Persisting cough, frontal and maxillary sinus pressure, abundant sputum production and nasal discharge, greenish in color. 2 days ago had a low-grade fever.   Review of Systems Denies chest pain or difficulty breathing No nausea, vomiting or unusual aches or pains No wheezing  Past Medical History:  Diagnosis Date  . Allergy    feline  . Anxiety     Past Surgical History:  Procedure Laterality Date  . REFRACTIVE SURGERY  2012   Dr Lucita Ferrara  . SKIN CANCER EXCISION     right ear lobe  . TONSILLECTOMY AND ADENOIDECTOMY      Social History   Social History  . Marital status: Married    Spouse name: N/A  . Number of children: N/A  . Years of education: N/A   Occupational History  . Not on file.   Social History Main Topics  . Smoking status: Former Smoker    Types: Cigarettes  . Smokeless tobacco: Never Used  . Alcohol use 4.2 oz/week    7 Glasses of wine per week  . Drug use: No  . Sexual activity: Yes   Other Topics Concern  . Not on file   Social History Narrative   Exercise- walking         Allergies as of 08/05/2017   No Known Allergies     Medication List       Accurate as of 08/05/17 11:59 PM. Always use your most recent med list.          acetaminophen 500 MG tablet Commonly known as:  TYLENOL Take 500 mg by mouth every 6 (six) hours as needed.   acyclovir 200 MG capsule Commonly known as:  ZOVIRAX Take 1 capsule (200 mg total) by mouth 3 (three) times daily.   ALPRAZolam 0.25 MG tablet Commonly known as:  XANAX Take 1 tablet (0.25 mg total) by mouth 3 (three) times daily.   azelastine 0.1 % nasal spray Commonly known as:  ASTELIN Place 2 sprays into both nostrils at bedtime as needed for rhinitis. Use in each  nostril as directed   cefUROXime 500 MG tablet Commonly known as:  CEFTIN Take 1 tablet (500 mg total) by mouth 2 (two) times daily with a meal.   cetirizine 10 MG tablet Commonly known as:  ZYRTEC Take 10 mg by mouth daily.   citalopram 10 MG tablet Commonly known as:  CELEXA TAKE 1 TABLET (10 MG TOTAL) BY MOUTH DAILY. OFFICE VISIT DUE NOW   estradiol 0.05 MG/24HR patch Commonly known as:  VIVELLE-DOT APPLY 1 PATCH TWO TIMES A WEEK   fluticasone 50 MCG/ACT nasal spray Commonly known as:  FLONASE Place 2 sprays into both nostrils daily.   HRT SUPPORT PO Take by mouth.   progesterone 100 MG capsule Commonly known as:  PROMETRIUM   zolpidem 5 MG tablet Commonly known as:  AMBIEN TAKE 1 TABLET BY MOUTH EVERY 3 NIGHTS AS NEEDED          Objective:   Physical Exam BP 118/64 (BP Location: Left Arm, Patient Position: Sitting, Cuff Size: Small)   Pulse 61   Temp 98.1 F (36.7 C) (Oral)   Resp 14   Ht 5\' 4"  (1.626 m)   Wt 116 lb 6 oz (52.8  kg)   SpO2 97%   BMI 19.98 kg/m   General:   Well developed, well nourished . NAD.  HEENT:  Normocephalic . Face symmetric, atraumatic. TMs normal, throat symmetric, minimal redness. No white patches Sinuses: TTP throughout, worse on the right maxillary area Lungs:  + Rhonchi, no wheezing or crackles. Normal respiratory effort, no intercostal retractions, no accessory muscle use. Heart: RRR,  no murmur.  No pretibial edema bilaterally  Skin: Not pale. Not jaundice Neurologic:  alert & oriented X3.  Speech normal, gait appropriate for age and unassisted Psych--  Cognition and judgment appear intact.  Cooperative with normal attention span and concentration.  Behavior appropriate. No anxious or depressed appearing.      Assessment & Plan:   H/o anxiety, insomnia, DUB, Presents with the following: Bronchitis, mild sinusitis: Will  treated with Ceftin, Astelin, Flonase. See instructions. Call if no better

## 2017-08-05 NOTE — Patient Instructions (Signed)
Rest, fluids , tylenol  For cough:  Take Mucinex DM twice a day as needed until better  For nasal congestion: Use OTC   Flonase : 2 nasal sprays on each side of the nose in the morning until you feel better Use ASTELIN a prescribed spray : 2 nasal sprays on each side of the nose at night until you feel better    Take the antibiotic as prescribed  (ceftin)  Call if not gradually better over the next  10 days  Call anytime if the symptoms are severe

## 2017-08-06 ENCOUNTER — Ambulatory Visit (INDEPENDENT_AMBULATORY_CARE_PROVIDER_SITE_OTHER): Payer: BC Managed Care – PPO | Admitting: Psychology

## 2017-08-06 DIAGNOSIS — F4321 Adjustment disorder with depressed mood: Secondary | ICD-10-CM | POA: Diagnosis not present

## 2017-08-12 ENCOUNTER — Other Ambulatory Visit: Payer: Self-pay | Admitting: Family Medicine

## 2017-08-12 DIAGNOSIS — F411 Generalized anxiety disorder: Secondary | ICD-10-CM

## 2017-08-14 ENCOUNTER — Telehealth: Payer: Self-pay | Admitting: Internal Medicine

## 2017-08-14 NOTE — Telephone Encounter (Signed)
Spoke w/ Pt, informed of recommendations. Pt verbalized understanding.  

## 2017-08-14 NOTE — Telephone Encounter (Signed)
Please advise 

## 2017-08-14 NOTE — Telephone Encounter (Signed)
Copied from [place] [id number]. Topic: Quick Communication - See Telephone Encounter >> Aug 14, 2017  8:03 AM Rosalin Hawking wrote: CRM for notification. See Telephone encounter for:  08/14/17. Pt tel [phone] [facility] [place]  Pt stated was seen in our office on 08-05-2017 for a sinus infection and stated was given antibiotics and meds for her issue but pt states as soon she finished her medication started to have diarrhea, pt does not know if it has to do with medication reaction since pt mentioned that the diarrhea started after she finished meds. Please advise.

## 2017-08-14 NOTE — Telephone Encounter (Signed)
Diarrhea could be related with antibiotic. Most likely this will self resolve. Recommend good hydration, Pepto-Bismol as needed, start OTC probiotic daily such as ALIGN. Call in few days if not better, call anytime is severe symptoms, fever, chills or blood in the stools.

## 2017-08-26 ENCOUNTER — Ambulatory Visit: Payer: BC Managed Care – PPO | Admitting: Psychology

## 2017-09-16 ENCOUNTER — Ambulatory Visit: Payer: BC Managed Care – PPO | Admitting: Psychology

## 2017-09-16 DIAGNOSIS — F4321 Adjustment disorder with depressed mood: Secondary | ICD-10-CM | POA: Diagnosis not present

## 2017-10-13 ENCOUNTER — Telehealth: Payer: Self-pay | Admitting: Family Medicine

## 2017-10-13 DIAGNOSIS — Z79899 Other long term (current) drug therapy: Secondary | ICD-10-CM

## 2017-10-14 ENCOUNTER — Other Ambulatory Visit: Payer: Self-pay | Admitting: Family Medicine

## 2017-10-14 MED ORDER — ALPRAZOLAM 0.25 MG PO TABS: 0.2500 mg | ORAL_TABLET | Freq: Three times a day (TID) | ORAL | 0 refills | Status: DC

## 2017-10-14 NOTE — Telephone Encounter (Signed)
RequestingDuanne Moron Contract: YES UDS:NO Last OV: 08/14/17 Next OV: Not scheduled  Last Refill: 01/30/17   Please advise   ~~I PRINTED OFF RX FOR PT BY A MISTAKE I DID SHRED IT

## 2017-10-14 NOTE — Telephone Encounter (Signed)
Need uds Need database

## 2017-10-16 ENCOUNTER — Ambulatory Visit: Payer: BC Managed Care – PPO | Admitting: Psychology

## 2017-10-16 DIAGNOSIS — F4321 Adjustment disorder with depressed mood: Secondary | ICD-10-CM

## 2017-10-16 NOTE — Telephone Encounter (Signed)
Ok to refill x1  1 refill but need uds and contract if not done in last year

## 2017-10-16 NOTE — Telephone Encounter (Signed)
Database ran and is on your desk for review.

## 2017-10-16 NOTE — Addendum Note (Signed)
Addended by: Kem Boroughs D on: 10/16/2017 05:46 PM   Modules accepted: Orders

## 2017-10-16 NOTE — Telephone Encounter (Signed)
Patient will be in before next refill to do UDS.  Future order placed

## 2017-11-08 ENCOUNTER — Other Ambulatory Visit: Payer: Self-pay | Admitting: Family Medicine

## 2017-11-08 DIAGNOSIS — F411 Generalized anxiety disorder: Secondary | ICD-10-CM

## 2017-11-14 ENCOUNTER — Ambulatory Visit: Payer: BC Managed Care – PPO | Admitting: Psychology

## 2017-11-14 DIAGNOSIS — F4321 Adjustment disorder with depressed mood: Secondary | ICD-10-CM

## 2017-12-10 ENCOUNTER — Ambulatory Visit: Payer: BC Managed Care – PPO | Admitting: Psychology

## 2017-12-11 ENCOUNTER — Ambulatory Visit: Payer: BC Managed Care – PPO | Admitting: Psychology

## 2017-12-11 DIAGNOSIS — F4321 Adjustment disorder with depressed mood: Secondary | ICD-10-CM | POA: Diagnosis not present

## 2018-01-06 ENCOUNTER — Ambulatory Visit: Payer: BC Managed Care – PPO | Admitting: Psychology

## 2018-01-06 DIAGNOSIS — F4321 Adjustment disorder with depressed mood: Secondary | ICD-10-CM | POA: Diagnosis not present

## 2018-01-22 ENCOUNTER — Encounter: Payer: Self-pay | Admitting: Family Medicine

## 2018-01-22 ENCOUNTER — Ambulatory Visit: Payer: BC Managed Care – PPO | Admitting: Family Medicine

## 2018-01-22 VITALS — BP 122/82 | HR 67 | Temp 98.8°F | Ht 64.0 in | Wt 107.2 lb

## 2018-01-22 DIAGNOSIS — J0110 Acute frontal sinusitis, unspecified: Secondary | ICD-10-CM

## 2018-01-22 DIAGNOSIS — F411 Generalized anxiety disorder: Secondary | ICD-10-CM | POA: Diagnosis not present

## 2018-01-22 MED ORDER — AZELASTINE HCL 0.1 % NA SOLN
2.0000 | Freq: Every evening | NASAL | 3 refills | Status: DC | PRN
Start: 2018-01-22 — End: 2018-04-14

## 2018-01-22 MED ORDER — ALPRAZOLAM 0.25 MG PO TABS: 0.2500 mg | ORAL_TABLET | Freq: Three times a day (TID) | ORAL | 0 refills | Status: DC

## 2018-01-22 MED ORDER — AMOXICILLIN 500 MG PO CAPS: 1000.0000 mg | ORAL_CAPSULE | Freq: Two times a day (BID) | ORAL | 0 refills | Status: DC

## 2018-01-22 NOTE — Progress Notes (Signed)
[facility] [address], [place], [place] [address] [phone]  Date:  01/22/2018   Name:  Adriana Lopez   DOB:  04-13-1961   MRN:  [id number]  PCP:  Ann Held, DO    Chief Complaint: Sinus Problem (Pt here for sinus pressure. )   History of Present Illness:  Adriana Lopez is a 56 y.o. very pleasant female patient who presents with the following:  Here today for a sick visit-  She thinks that she has a sinus infection- triggered by allergies in the spring Sx for about one week now She notes pain and pressure in her frontal sinuses She is getting yellow discharge from her nose No fever or chills No earache no ST No GI symptoms  She has PND that is triggering a cough She also needs a refill of her xanax rx She uses xanax on rare occasion- looks like Kendrick Fries last gave her 30 pills in December  [facility]: filled her xanax in December.  No unexpected entries. She does have an rx for [place] but not filled in a long time, she uses rarely   Has not really tried anything OTC so far for her sinuses  She would like a refill of her astelin today as well which is fine  Patient Active Problem List   Diagnosis Date Noted  . Tick bite 04/17/2015  . Right shoulder pain 11/17/2014  . Trapezius muscle spasm 11/17/2014  . Generalized anxiety disorder 03/22/2014  . Viral gastroenteritis 09/07/2013  . LBP (low back pain) 09/07/2013  . Maxillary sinusitis, acute 06/30/2013  . Facial eczema 06/11/2013  . Pneumonia, bacterial 10/29/2012  . Basal cell carcinoma 08/18/2012    Past Medical History:  Diagnosis Date  . Allergy    feline  . Anxiety     Past Surgical History:  Procedure Laterality Date  . REFRACTIVE SURGERY  2012   Dr Lucita Ferrara  . SKIN CANCER EXCISION     right ear lobe  . TONSILLECTOMY AND ADENOIDECTOMY      Social History   Tobacco Use  . Smoking status: Former Smoker    Types:  Cigarettes  . Smokeless tobacco: Never Used  Substance Use Topics  . Alcohol use: Yes    Alcohol/week: 4.2 oz    Types: 7 Glasses of wine per week  . Drug use: No    Family History  Problem Relation Age of Onset  . Heart disease Father        pacer  . Cancer Father        dermatologic  . Cancer Maternal Aunt        uterine  . Alzheimer's disease Maternal Aunt   . Alzheimer's disease Mother   . Diabetes Neg Hx   . Stroke Neg Hx   . Hypertension Neg Hx     No Known Allergies  Medication list has been reviewed and updated.  Current Outpatient Medications on File Prior to Visit  Medication Sig Dispense Refill  . acetaminophen (TYLENOL) 500 MG tablet Take 500 mg by mouth every 6 (six) hours as needed.    Marland Kitchen acyclovir (ZOVIRAX) 200 MG capsule Take 1 capsule (200 mg total) by mouth 3 (three) times daily. 30 capsule 2  . ALPRAZolam (XANAX) 0.25 MG tablet Take 1 tablet (0.25 mg total) by mouth 3 (three) times daily. 30 tablet 0  . azelastine (ASTELIN) 0.1 % nasal spray Place 2 sprays into  both nostrils at bedtime as needed for rhinitis. Use in each nostril as directed 30 mL 3  . cetirizine (ZYRTEC) 10 MG tablet Take 10 mg by mouth daily.    . citalopram (CELEXA) 10 MG tablet TAKE 1 TABLET BY MOUTH DAILY *OFFICE VISIT DUE* 90 tablet 0  . estradiol (VIVELLE-DOT) 0.05 MG/24HR patch APPLY 1 PATCH TWO TIMES A WEEK  0  . fluticasone (FLONASE) 50 MCG/ACT nasal spray Place 2 sprays into both nostrils daily. 16 g 6  . Nutritional Supplements (HRT SUPPORT PO) Take by mouth.    . progesterone (PROMETRIUM) 100 MG capsule     . zolpidem (AMBIEN) 5 MG tablet TAKE 1 TABLET BY MOUTH EVERY 3 NIGHTS AS NEEDED 30 tablet 0   No current facility-administered medications on file prior to visit.     Review of Systems:  As per HPI- otherwise negative. No fever or chills     Physical Examination: Vitals:   01/22/18 1725  BP: 122/82  Pulse: 67  Temp: 98.8 F (37.1 C)  SpO2: 96%   Vitals:    01/22/18 1725  Weight: 107 lb 3.2 oz (48.6 kg)  Height: 5\' 4"  (1.626 m)   Body mass index is 18.4 kg/m. Ideal Body Weight: Weight in (lb) to have BMI = 25: 145.3  GEN: WDWN, NAD, Non-toxic, A & O x 3, slight build, looks well  HEENT: Atraumatic, Normocephalic. Neck supple. No masses, No LAD. Bilateral TM wnl, oropharynx normal.  PEERL,EOMI.   Nasal cavity inflamed and sinuses are tender to percussion  Ears and Nose: No external deformity. CV: RRR, No M/G/R. No JVD. No thrill. No extra heart sounds. PULM: CTA B, no wheezes, crackles, rhonchi. No retractions. No resp. distress. No accessory muscle use. EXTR: No c/c/e NEURO Normal gait.  PSYCH: Normally interactive. Conversant. Not depressed or anxious appearing.  Calm demeanor.    Assessment and Plan: Acute frontal sinusitis, recurrence not specified - Plan: amoxicillin (AMOXIL) 500 MG capsule, azelastine (ASTELIN) 0.1 % nasal spray  Generalized anxiety disorder - Plan: ALPRAZolam (XANAX) 0.25 MG tablet  Treat with amox for sinus infection and refilled astelin today Refilled her xanax as well to use prn She will let me know if not feeling better soon   Signed Lamar Blinks, MD

## 2018-01-22 NOTE — Patient Instructions (Signed)
Good to see you today- I hope that you feel much better soon! We will use amoxicillin for your sinus infection- let me know if not improved in a few days astelin spray as needed I refilled your xanax, do not combine with ambien or any other sedating substance  Take care!

## 2018-01-27 ENCOUNTER — Ambulatory Visit: Payer: BC Managed Care – PPO | Admitting: Psychology

## 2018-01-28 ENCOUNTER — Ambulatory Visit: Payer: BC Managed Care – PPO | Admitting: Psychology

## 2018-01-28 DIAGNOSIS — F4321 Adjustment disorder with depressed mood: Secondary | ICD-10-CM

## 2018-02-07 ENCOUNTER — Other Ambulatory Visit: Payer: Self-pay | Admitting: Family Medicine

## 2018-02-07 DIAGNOSIS — F411 Generalized anxiety disorder: Secondary | ICD-10-CM

## 2018-02-16 ENCOUNTER — Ambulatory Visit: Payer: BC Managed Care – PPO | Admitting: Psychology

## 2018-02-16 DIAGNOSIS — F4321 Adjustment disorder with depressed mood: Secondary | ICD-10-CM

## 2018-03-26 ENCOUNTER — Ambulatory Visit: Payer: BC Managed Care – PPO | Admitting: Psychology

## 2018-03-26 DIAGNOSIS — F4321 Adjustment disorder with depressed mood: Secondary | ICD-10-CM | POA: Diagnosis not present

## 2018-04-09 ENCOUNTER — Ambulatory Visit: Payer: BC Managed Care – PPO | Admitting: Psychology

## 2018-04-09 DIAGNOSIS — F4321 Adjustment disorder with depressed mood: Secondary | ICD-10-CM | POA: Diagnosis not present

## 2018-04-14 ENCOUNTER — Other Ambulatory Visit: Payer: Self-pay | Admitting: Family Medicine

## 2018-04-14 DIAGNOSIS — J0110 Acute frontal sinusitis, unspecified: Secondary | ICD-10-CM

## 2018-04-21 ENCOUNTER — Ambulatory Visit: Payer: BC Managed Care – PPO | Admitting: Psychology

## 2018-04-21 DIAGNOSIS — F4321 Adjustment disorder with depressed mood: Secondary | ICD-10-CM | POA: Diagnosis not present

## 2018-05-06 ENCOUNTER — Ambulatory Visit: Payer: BC Managed Care – PPO | Admitting: Psychology

## 2018-05-06 DIAGNOSIS — F4321 Adjustment disorder with depressed mood: Secondary | ICD-10-CM

## 2018-05-08 ENCOUNTER — Other Ambulatory Visit: Payer: Self-pay | Admitting: Family Medicine

## 2018-05-08 DIAGNOSIS — F411 Generalized anxiety disorder: Secondary | ICD-10-CM

## 2018-05-29 ENCOUNTER — Ambulatory Visit: Payer: BC Managed Care – PPO | Admitting: Psychology

## 2018-05-29 DIAGNOSIS — F4321 Adjustment disorder with depressed mood: Secondary | ICD-10-CM

## 2018-06-19 ENCOUNTER — Ambulatory Visit: Payer: BC Managed Care – PPO | Admitting: Psychology

## 2018-07-03 ENCOUNTER — Ambulatory Visit: Payer: BC Managed Care – PPO | Admitting: Psychology

## 2018-07-03 DIAGNOSIS — F4321 Adjustment disorder with depressed mood: Secondary | ICD-10-CM | POA: Diagnosis not present

## 2018-07-10 ENCOUNTER — Ambulatory Visit: Payer: BC Managed Care – PPO | Admitting: Family Medicine

## 2018-07-10 ENCOUNTER — Encounter: Payer: Self-pay | Admitting: Family Medicine

## 2018-07-10 VITALS — BP 113/59 | HR 63 | Temp 98.4°F | Resp 16 | Ht 64.0 in | Wt 114.4 lb

## 2018-07-10 DIAGNOSIS — S30861A Insect bite (nonvenomous) of abdominal wall, initial encounter: Secondary | ICD-10-CM

## 2018-07-10 DIAGNOSIS — W57XXXA Bitten or stung by nonvenomous insect and other nonvenomous arthropods, initial encounter: Secondary | ICD-10-CM | POA: Diagnosis not present

## 2018-07-10 MED ORDER — DOXYCYCLINE HYCLATE 100 MG PO TABS: 100.0000 mg | ORAL_TABLET | Freq: Two times a day (BID) | ORAL | 0 refills | Status: DC

## 2018-07-10 NOTE — Patient Instructions (Signed)

## 2018-07-10 NOTE — Progress Notes (Signed)
Patient ID: Aarica Wax, female    DOB: November 30, 1960  Age: 57 y.o. MRN: [id number]    Subjective:  Subjective  HPI Kasiyah Platter presents for tick bite on abd 2 days ago.    Review of Systems  Constitutional: Negative for chills and fever.  HENT: Negative for congestion and hearing loss.   Eyes: Negative for discharge.  Respiratory: Negative for cough and shortness of breath.   Cardiovascular: Negative for chest pain, palpitations and leg swelling.  Gastrointestinal: Negative for abdominal pain, blood in stool, constipation, diarrhea, nausea and vomiting.  Genitourinary: Negative for dysuria, frequency, hematuria and urgency.  Musculoskeletal: Negative for back pain and myalgias.  Skin: Negative for rash.  Allergic/Immunologic: Negative for environmental allergies.  Neurological: Negative for dizziness, weakness and headaches.  Hematological: Does not bruise/bleed easily.  Psychiatric/Behavioral: Negative for suicidal ideas. The patient is not nervous/anxious.     History Past Medical History:  Diagnosis Date  . Allergy    feline  . Anxiety     She has a past surgical history that includes Tonsillectomy and adenoidectomy; Refractive surgery (2012); and Skin cancer excision.   Her family history includes Alzheimer's disease in her maternal aunt and mother; Cancer in her father and maternal aunt; Heart disease in her father.She reports that she has quit smoking. Her smoking use included cigarettes. She has never used smokeless tobacco. She reports that she drinks about 7.0 standard drinks of alcohol per week. She reports that she does not use drugs.  Current Outpatient Medications on File Prior to Visit  Medication Sig Dispense Refill  . acetaminophen (TYLENOL) 500 MG tablet Take 500 mg by mouth every 6 (six) hours as needed.    Marland Kitchen acyclovir (ZOVIRAX) 200 MG capsule Take 1 capsule (200 mg total) by mouth 3 (three) times daily. 30 capsule 2  . ALPRAZolam (XANAX) 0.25 MG  tablet Take 1 tablet (0.25 mg total) by mouth 3 (three) times daily. 30 tablet 0  . amoxicillin (AMOXIL) 500 MG capsule Take 2 capsules (1,000 mg total) by mouth 2 (two) times daily. 40 capsule 0  . azelastine (ASTELIN) 0.1 % nasal spray PLACE 2 SPRAYS INTO BOTH NOSTRILS AT BEDTIME AS NEEDED FOR RHINITIS. USE IN EACH NOSTRIL AS DIRECTED 30 mL 1  . cetirizine (ZYRTEC) 10 MG tablet Take 10 mg by mouth daily.    . citalopram (CELEXA) 10 MG tablet TAKE 1 TABLET BY MOUTH DAILY *OFFICE VISIT DUE* 90 tablet 0  . estradiol (VIVELLE-DOT) 0.05 MG/24HR patch APPLY 1 PATCH TWO TIMES A WEEK  0  . fluticasone (FLONASE) 50 MCG/ACT nasal spray Place 2 sprays into both nostrils daily. 16 g 6  . Nutritional Supplements (HRT SUPPORT PO) Take by mouth.    . progesterone (PROMETRIUM) 100 MG capsule     . zolpidem (AMBIEN) 5 MG tablet TAKE 1 TABLET BY MOUTH EVERY 3 NIGHTS AS NEEDED 30 tablet 0   No current facility-administered medications on file prior to visit.      Objective:  Objective  Physical Exam  Constitutional: She is oriented to person, place, and time. She appears well-developed and well-nourished. No distress.  HENT:  Right Ear: External ear normal.  Left Ear: External ear normal.  Nose: Nose normal.  Mouth/Throat: Oropharynx is clear and moist.  Eyes: Pupils are equal, round, and reactive to light. EOM are normal.  Neck: Normal range of motion. Neck supple.  Cardiovascular: Normal rate, regular rhythm and normal heart sounds.  No murmur heard. Pulmonary/Chest: Effort normal  and breath sounds normal. No respiratory distress. She has no wheezes. She has no rales. She exhibits no tenderness.  Neurological: She is alert and oriented to person, place, and time.  Skin: Rash noted. Rash is papular.     Psychiatric: She has a normal mood and affect.  Nursing note and vitals reviewed.    BP (!) 113/59 (BP Location: Right Arm, Patient Position: Sitting, Cuff Size: Small)   Pulse 63   Temp 98.4  F (36.9 C) (Oral)   Resp 16   Ht 5\' 4"  (1.626 m)   Wt 114 lb 6.4 oz (51.9 kg)   SpO2 100%   BMI 19.64 kg/m  Wt Readings from Last 3 Encounters:  07/10/18 114 lb 6.4 oz (51.9 kg)  01/22/18 107 lb 3.2 oz (48.6 kg)  08/05/17 116 lb 6 oz (52.8 kg)     Lab Results  Component Value Date   WBC 7.8 03/25/2017   HGB 13.6 03/25/2017   HCT 40.4 03/25/2017   PLT 332.0 03/25/2017   GLUCOSE 81 03/25/2017   CHOL 176 03/25/2017   TRIG 42.0 03/25/2017   HDL 94.60 03/25/2017   LDLCALC 73 03/25/2017   ALT 13 03/25/2017   AST 22 03/25/2017   NA 137 03/25/2017   K 4.1 03/25/2017   CL 102 03/25/2017   CREATININE 0.72 03/25/2017   BUN 19 03/25/2017   CO2 29 03/25/2017   TSH 1.98 03/25/2017    Dg Bone Density  Result Date: 03/06/2017 EXAM: DUAL X-RAY ABSORPTIOMETRY (DXA) FOR BONE MINERAL DENSITY IMPRESSION: Referring Physician:  Rosalita Chessman CHASE PATIENT: Name: Kayte, Borchard Patient ID: [id number] Birth Date: [birth year] Height: 52.5 in. Sex: Female Measured: 03/06/2017 Weight: 116.0 lbs. Indications: Caucasian, Estrogen Deficiency, Post Menopausal, Previous Tobacco User Fractures: Treatments: Multivitamin ASSESSMENT: The BMD measured at Femur Neck Right is 0.853 g/cm2 with a T-score of -1.3. This patient is considered OSTEOPENIC according to [facility]) criteria. Site Region Measured Date Measured Age WHO YA BMD Classification T-score AP Spine L1-L4 03/06/2017 56.8 Normal -0.2 1.161 g/cm2 DualFemur Neck Right 2/06/2017 56.8 years Osteopenia -1.3 0.853 g/cm2 World Health Organization [facility]) criteria for post-menopausal, Caucasian Women: Normal       T-score at or above -1 SD Osteopenia   T-score between -1 and -2.5 SD Osteoporosis T-score at or below -2.5 SD RECOMMENDATION: [place] recommends that FDA-approved medical therapies be considered in postmenopausal women and men age 53 or older with a: 1. Hip or vertebral (clinical or morphometric) fracture.  2. T-score of < -2.5 at the spine or hip. 3. Ten-year fracture probability by FRAX of 3% or greater for hip fracture or 20% or greater for major osteoporotic fracture. All treatment decisions require clinical judgment and consideration of individual patient factors, including patient preferences, co-morbidities, previous drug use, risk factors not captured in the FRAX model (e.g. falls, vitamin D deficiency, increased bone turnover, interval significant decline in bone density) and possible under - or over-estimation of fracture risk by FRAX. All patients should ensure an adequate intake of dietary calcium (1200 mg/d) and vitamin D (800 IU daily) unless contraindicated. FOLLOW-UP: People with diagnosed cases of osteoporosis or at high risk for fracture should have regular bone mineral density tests. For patients eligible for Medicare, routine testing is allowed once every 2 years. The testing frequency can be increased to one year for patients who have rapidly progressing disease, those who are receiving or discontinuing medical therapy to restore bone mass, or have additional  risk factors. I have reviewed this report and agree with the above findings. Mark A. Thornton Papas, M.D. [facility]iology Patient: Norinne, Jeane Referring Physician: Rosalita Chessman CHASE Birth Date: [birth year] [age] Patient ID: [id number] Height: 52.5 in. Weight: 116.0 lbs. Measured: 03/06/2017 3:12:54 PM (16 SP 2) Sex: Female Ethnicity: White Analyzed: 03/06/2017 3:14:13 PM (16 SP 2) FRAX* 10-year Probability of Fracture Based on femoral neck BMD: DualFemur (Right) Major Osteoporotic Fracture: 6.6% Hip Fracture:                0.4% Population:                  [place] (Caucasian) Risk Factors:                None *FRAX is a Materials engineer of the [employer] of [employer] for Metabolic Bone Disease, a World Pharmacologist (WHO) [facility]. ASSESSMENT: The probability of a major osteoporotic fracture  is 6.6% within the next ten years. The probability of a hip fracture is 0.4% within the next ten years. I have reviewed this report and agree with the above findings. Mark A. Thornton Papas, M.D. [facility] Electronically Signed   By: Lavonia Dana M.D.   On: 03/06/2017 16:35     Assessment & Plan:  Plan  I am having Keisy E. Tipler start on doxycycline. I am also having her maintain her Nutritional Supplements (HRT SUPPORT PO), acetaminophen, fluticasone, cetirizine, acyclovir, estradiol, progesterone, zolpidem, amoxicillin, ALPRAZolam, azelastine, and citalopram.  Meds ordered this encounter  Medications  . doxycycline (VIBRA-TABS) 100 MG tablet    Sig: Take 1 tablet (100 mg total) by mouth 2 (two) times daily.    Dispense:  20 tablet    Refill:  0    Problem List Items Addressed This Visit    None    Visit Diagnoses    Tick bite of abdomen, initial encounter    -  Primary   Relevant Medications   doxycycline (VIBRA-TABS) 100 MG tablet   Other Relevant Orders   B. burgdorfi antibodies   Rocky mtn spotted fvr abs pnl(IgG+IgM)      Follow-up: Return if symptoms worsen or fail to improve.  Ann Held, DO

## 2018-07-15 LAB — B. BURGDORFI ANTIBODIES: B burgdorferi Ab IgG+IgM: <0.9 index

## 2018-07-15 LAB — ROCKY MTN SPOTTED FVR ABS PNL(IGG+IGM)
RMSF IGG: NOT DETECTED
RMSF IGM: NOT DETECTED

## 2018-07-17 ENCOUNTER — Ambulatory Visit: Payer: BC Managed Care – PPO | Admitting: Psychology

## 2018-07-17 DIAGNOSIS — F4321 Adjustment disorder with depressed mood: Secondary | ICD-10-CM

## 2018-07-21 ENCOUNTER — Ambulatory Visit (INDEPENDENT_AMBULATORY_CARE_PROVIDER_SITE_OTHER): Payer: BC Managed Care – PPO | Admitting: Family Medicine

## 2018-07-21 ENCOUNTER — Encounter: Payer: Self-pay | Admitting: Family Medicine

## 2018-07-21 VITALS — BP 110/72 | HR 59 | Temp 98.1°F | Resp 16 | Ht 63.4 in | Wt 111.8 lb

## 2018-07-21 DIAGNOSIS — Z23 Encounter for immunization: Secondary | ICD-10-CM | POA: Diagnosis not present

## 2018-07-21 DIAGNOSIS — F419 Anxiety disorder, unspecified: Secondary | ICD-10-CM

## 2018-07-21 DIAGNOSIS — G4700 Insomnia, unspecified: Secondary | ICD-10-CM

## 2018-07-21 DIAGNOSIS — Z0000 Encounter for general adult medical examination without abnormal findings: Secondary | ICD-10-CM

## 2018-07-21 MED ORDER — ZOLPIDEM TARTRATE 5 MG PO TABS: ORAL_TABLET | ORAL | 0 refills | Status: DC

## 2018-07-21 NOTE — Patient Instructions (Signed)
Preventive Care 40-64 Years, Female Preventive care refers to lifestyle choices and visits with your health care provider that can promote health and wellness. What does preventive care include?  A yearly physical exam. This is also called an annual well check.  Dental exams once or twice a year.  Routine eye exams. Ask your health care provider how often you should have your eyes checked.  Personal lifestyle choices, including: ? Daily care of your teeth and gums. ? Regular physical activity. ? Eating a healthy diet. ? Avoiding tobacco and drug use. ? Limiting alcohol use. ? Practicing safe sex. ? Taking low-dose aspirin daily starting at age 58. ? Taking vitamin and mineral supplements as recommended by your health care provider. What happens during an annual well check? The services and screenings done by your health care provider during your annual well check will depend on your age, overall health, lifestyle risk factors, and family history of disease. Counseling Your health care provider may ask you questions about your:  Alcohol use.  Tobacco use.  Drug use.  Emotional well-being.  Home and relationship well-being.  Sexual activity.  Eating habits.  Work and work Statistician.  Method of birth control.  Menstrual cycle.  Pregnancy history.  Screening You may have the following tests or measurements:  Height, weight, and BMI.  Blood pressure.  Lipid and cholesterol levels. These may be checked every 5 years, or more frequently if you are over 81 years old.  Skin check.  Lung cancer screening. You may have this screening every year starting at age 78 if you have a 30-pack-year history of smoking and currently smoke or have quit within the past 15 years.  Fecal occult blood test (FOBT) of the stool. You may have this test every year starting at age 65.  Flexible sigmoidoscopy or colonoscopy. You may have a sigmoidoscopy every 5 years or a colonoscopy  every 10 years starting at age 30.  Hepatitis C blood test.  Hepatitis B blood test.  Sexually transmitted disease (STD) testing.  Diabetes screening. This is done by checking your blood sugar (glucose) after you have not eaten for a while (fasting). You may have this done every 1-3 years.  Mammogram. This may be done every 1-2 years. Talk to your health care provider about when you should start having regular mammograms. This may depend on whether you have a family history of breast cancer.  BRCA-related cancer screening. This may be done if you have a family history of breast, ovarian, tubal, or peritoneal cancers.  Pelvic exam and Pap test. This may be done every 3 years starting at age 80. Starting at age 36, this may be done every 5 years if you have a Pap test in combination with an HPV test.  Bone density scan. This is done to screen for osteoporosis. You may have this scan if you are at high risk for osteoporosis.  Discuss your test results, treatment options, and if necessary, the need for more tests with your health care provider. Vaccines Your health care provider may recommend certain vaccines, such as:  Influenza vaccine. This is recommended every year.  Tetanus, diphtheria, and acellular pertussis (Tdap, Td) vaccine. You may need a Td booster every 10 years.  Varicella vaccine. You may need this if you have not been vaccinated.  Zoster vaccine. You may need this after age 5.  Measles, mumps, and rubella (MMR) vaccine. You may need at least one dose of MMR if you were born in  1957 or later. You may also need a second dose.  Pneumococcal 13-valent conjugate (PCV13) vaccine. You may need this if you have certain conditions and were not previously vaccinated.  Pneumococcal polysaccharide (PPSV23) vaccine. You may need one or two doses if you smoke cigarettes or if you have certain conditions.  Meningococcal vaccine. You may need this if you have certain  conditions.  Hepatitis A vaccine. You may need this if you have certain conditions or if you travel or work in places where you may be exposed to hepatitis A.  Hepatitis B vaccine. You may need this if you have certain conditions or if you travel or work in places where you may be exposed to hepatitis B.  Haemophilus influenzae type b (Hib) vaccine. You may need this if you have certain conditions.  Talk to your health care provider about which screenings and vaccines you need and how often you need them. This information is not intended to replace advice given to you by your health care provider. Make sure you discuss any questions you have with your health care provider. Document Released: 11/10/2015 Document Revised: 07/03/2016 Document Reviewed: 08/15/2015 Elsevier Interactive Patient Education  2018 [employer].  

## 2018-07-21 NOTE — Progress Notes (Signed)
Subjective:     Adriana Lopez is a 57 y.o. female and is here for a comprehensive physical exam. The patient reports no problems.  Social History   Socioeconomic History  . Marital status: Married    Spouse name: Not on file  . Number of children: Not on file  . Years of education: Not on file  . Highest education level: Not on file  Occupational History  . Not on file  Social Needs  . Financial resource strain: Not on file  . Food insecurity:    Worry: Not on file    Inability: Not on file  . Transportation needs:    Medical: Not on file    Non-medical: Not on file  Tobacco Use  . Smoking status: Former Smoker    Types: Cigarettes  . Smokeless tobacco: Never Used  Substance and Sexual Activity  . Alcohol use: Yes    Alcohol/week: 7.0 standard drinks    Types: 7 Glasses of wine per week  . Drug use: No  . Sexual activity: Yes  Lifestyle  . Physical activity:    Days per week: Not on file    Minutes per session: Not on file  . Stress: Not on file  Relationships  . Social connections:    Talks on phone: Not on file    Gets together: Not on file    Attends religious service: Not on file    Active member of club or organization: Not on file    Attends meetings of clubs or organizations: Not on file    Relationship status: Not on file  . Intimate partner violence:    Fear of current or ex partner: Not on file    Emotionally abused: Not on file    Physically abused: Not on file    Forced sexual activity: Not on file  Other Topics Concern  . Not on file  Social History Narrative   Exercise- walking   Health Maintenance  Topic Date Due  . COLONOSCOPY  05/08/2010  . MAMMOGRAM  11/26/2014  . PAP SMEAR  08/28/2016  . TETANUS/TDAP  03/09/2019  . INFLUENZA VACCINE  Completed  . Hepatitis C Screening  Completed  . HIV Screening  Completed    The following portions of the patient's history were reviewed and updated as appropriate:  She  has a past medical  history of Allergy and Anxiety. She does not have any pertinent problems on file. She  has a past surgical history that includes Tonsillectomy and adenoidectomy; Refractive surgery (2012); and Skin cancer excision. Her family history includes Alzheimer's disease in her maternal aunt and mother; Cancer in her father and maternal aunt; Heart disease in her father. She  reports that she has quit smoking. Her smoking use included cigarettes. She has never used smokeless tobacco. She reports that she drinks about 7.0 standard drinks of alcohol per week. She reports that she does not use drugs. She has a current medication list which includes the following prescription(s): acetaminophen, acyclovir, alprazolam, azelastine, cetirizine, citalopram, doxycycline, estradiol, fluticasone, nutritional supplements, progesterone, and zolpidem. Current Outpatient Medications on File Prior to Visit  Medication Sig Dispense Refill  . acetaminophen (TYLENOL) 500 MG tablet Take 500 mg by mouth every 6 (six) hours as needed.    Marland Kitchen acyclovir (ZOVIRAX) 200 MG capsule Take 1 capsule (200 mg total) by mouth 3 (three) times daily. 30 capsule 2  . ALPRAZolam (XANAX) 0.25 MG tablet Take 1 tablet (0.25 mg total) by mouth 3 (  three) times daily. 30 tablet 0  . azelastine (ASTELIN) 0.1 % nasal spray PLACE 2 SPRAYS INTO BOTH NOSTRILS AT BEDTIME AS NEEDED FOR RHINITIS. USE IN EACH NOSTRIL AS DIRECTED 30 mL 1  . cetirizine (ZYRTEC) 10 MG tablet Take 10 mg by mouth daily.    . citalopram (CELEXA) 10 MG tablet TAKE 1 TABLET BY MOUTH DAILY *OFFICE VISIT DUE* 90 tablet 0  . doxycycline (VIBRA-TABS) 100 MG tablet Take 1 tablet (100 mg total) by mouth 2 (two) times daily. 20 tablet 0  . estradiol (VIVELLE-DOT) 0.05 MG/24HR patch APPLY 1 PATCH TWO TIMES A WEEK  0  . fluticasone (FLONASE) 50 MCG/ACT nasal spray Place 2 sprays into both nostrils daily. 16 g 6  . Nutritional Supplements (HRT SUPPORT PO) Take by mouth.    . progesterone  (PROMETRIUM) 100 MG capsule      No current facility-administered medications on file prior to visit.    She has No Known Allergies..  Review of Systems Review of Systems  Constitutional: Negative for activity change, appetite change and fatigue.  HENT: Negative for hearing loss, congestion, tinnitus and ear discharge.  dentist q40m Eyes: Negative for visual disturbance (see optho q1y -- vision corrected to 20/20 with glasses).  Respiratory: Negative for cough, chest tightness and shortness of breath.   Cardiovascular: Negative for chest pain, palpitations and leg swelling.  Gastrointestinal: Negative for abdominal pain, diarrhea, constipation and abdominal distention.  Genitourinary: Negative for urgency, frequency, decreased urine volume and difficulty urinating.  Musculoskeletal: Negative for back pain, arthralgias and gait problem.  Skin: Negative for color change, pallor and rash.  Neurological: Negative for dizziness, light-headedness, numbness and headaches.  Hematological: Negative for adenopathy. Does not bruise/bleed easily.  Psychiatric/Behavioral: Negative for suicidal ideas, confusion, sleep disturbance, self-injury, dysphoric mood, decreased concentration and agitation.       Objective:    BP 110/72 (BP Location: Right Arm, Cuff Size: Normal)   Pulse (!) 59   Temp 98.1 F (36.7 C) (Oral)   Resp 16   Ht 5' 3.4" (1.61 m)   Wt 111 lb 12.8 oz (50.7 kg)   SpO2 98%   BMI 19.56 kg/m  General appearance: alert, cooperative, appears stated age and no distress Head: Normocephalic, without obvious abnormality, atraumatic Eyes: conjunctivae/corneas clear. PERRL, EOM's intact. Fundi benign. Ears: normal TM's and external ear canals both ears Nose: Nares normal. Septum midline. Mucosa normal. No drainage or sinus tenderness. Throat: lips, mucosa, and tongue normal; teeth and gums normal Neck: no adenopathy, no carotid bruit, no JVD, supple, symmetrical, trachea midline and  thyroid not enlarged, symmetric, no tenderness/mass/nodules Back: symmetric, no curvature. ROM normal. No CVA tenderness. Lungs: clear to auscultation bilaterally Breasts: gyn Heart: regular rate and rhythm, S1, S2 normal, no murmur, click, rub or gallop Abdomen: soft, non-tender; bowel sounds normal; no masses,  no organomegaly Pelvic: deferred Extremities: extremities normal, atraumatic, no cyanosis or edema Pulses: 2+ and symmetric Skin: Skin color, texture, turgor normal. No rashes or lesions Lymph nodes: Cervical, supraclavicular, and axillary nodes normal. Neurologic: Alert and oriented X 3, normal strength and tone. Normal symmetric reflexes. Normal coordination and gait    Assessment:    Healthy female exam.      Plan:    ghm utd Check labs  See After Visit Summary for Counseling Recommendations    1. Preventative health care see above - CBC with Differential/Platelet - Comprehensive metabolic panel - Lipid panel - TSH - Ambulatory referral to Gastroenterology  2. Insomnia, unspecified  type stable - zolpidem (AMBIEN) 5 MG tablet; TAKE 1 TABLET BY MOUTH EVERY 3 NIGHTS AS NEEDED  Dispense: 30 tablet; Refill: 0 - Pain Mgmt, Profile 8 w/Conf, U  3. Anxiety stable - Pain Mgmt, Profile 8 w/Conf, U

## 2018-07-22 LAB — COMPREHENSIVE METABOLIC PANEL
ALBUMIN: 4.4 g/dL (ref 3.5–5.2)
ALT: 20 U/L (ref 0–35)
AST: 24 U/L (ref 0–37)
Alkaline Phosphatase: 59 U/L (ref 39–117)
BUN: 25 mg/dL — AB (ref 6–23)
CHLORIDE: 100 meq/L (ref 96–112)
CO2: 28 mEq/L (ref 19–32)
CREATININE: 0.69 mg/dL (ref 0.40–1.20)
Calcium: 9.3 mg/dL (ref 8.4–10.5)
GFR: 92.81 mL/min (ref >60.00)
Glucose, Bld: 92 mg/dL (ref 70–99)
POTASSIUM: 3.9 meq/L (ref 3.5–5.1)
SODIUM: 138 meq/L (ref 135–145)
TOTAL PROTEIN: 6.6 g/dL (ref 6.0–8.3)
Total Bilirubin: 0.7 mg/dL (ref 0.2–1.2)

## 2018-07-22 LAB — CBC WITH DIFFERENTIAL/PLATELET
BASOS PCT: 0.7 % (ref 0.0–3.0)
Basophils Absolute: 0.1 10*3/uL (ref 0.0–0.1)
EOS PCT: 2.8 % (ref 0.0–5.0)
Eosinophils Absolute: 0.2 10*3/uL (ref 0.0–0.7)
HEMATOCRIT: 40 % (ref 36.0–46.0)
HEMOGLOBIN: 13.6 g/dL (ref 12.0–15.0)
LYMPHS PCT: 39 % (ref 12.0–46.0)
Lymphs Abs: 3 10*3/uL (ref 0.7–4.0)
MCHC: 34.1 g/dL (ref 30.0–36.0)
MCV: 99.9 fl (ref 78.0–100.0)
MONOS PCT: 8.2 % (ref 3.0–12.0)
Monocytes Absolute: 0.6 10*3/uL (ref 0.1–1.0)
Neutro Abs: 3.8 10*3/uL (ref 1.4–7.7)
Neutrophils Relative %: 49.3 % (ref 43.0–77.0)
Platelets: 313 10*3/uL (ref 150.0–400.0)
RBC: 4 Mil/uL (ref 3.87–5.11)
RDW: 12.1 % (ref 11.5–15.5)
WBC: 7.7 10*3/uL (ref 4.0–10.5)

## 2018-07-22 LAB — LIPID PANEL
CHOLESTEROL: 172 mg/dL (ref 0–200)
HDL: 98.3 mg/dL (ref >39.00)
LDL CALC: 61 mg/dL (ref 0–99)
NonHDL: 73.86
Total CHOL/HDL Ratio: 2
Triglycerides: 62 mg/dL (ref 0.0–149.0)
VLDL: 12.4 mg/dL (ref 0.0–40.0)

## 2018-07-22 LAB — TSH: TSH: 3.38 u[IU]/mL (ref 0.35–4.50)

## 2018-07-22 NOTE — Addendum Note (Signed)
Addended by: Kem Boroughs D on: 07/22/2018 08:22 AM   Modules accepted: Orders

## 2018-07-26 LAB — PAIN MGMT, PROFILE 8 W/CONF, U
6 Acetylmorphine: NEGATIVE ng/mL (ref <10)
Alcohol Metabolites: POSITIVE ng/mL — AB (ref <500)
Amphetamines: NEGATIVE ng/mL (ref <500)
BENZODIAZEPINES: NEGATIVE ng/mL (ref <100)
Buprenorphine, Urine: NEGATIVE ng/mL (ref <5)
CREATININE: 99.4 mg/dL
Cocaine Metabolite: NEGATIVE ng/mL (ref <150)
ETHYL GLUCURONIDE (ETG): 7921 ng/mL — AB (ref <500)
Ethyl Sulfate (ETS): 3854 ng/mL — ABNORMAL HIGH (ref <100)
MARIJUANA METABOLITE: NEGATIVE ng/mL (ref <20)
MDMA: NEGATIVE ng/mL (ref <500)
OPIATES: NEGATIVE ng/mL (ref <100)
Oxidant: NEGATIVE ug/mL (ref <200)
Oxycodone: NEGATIVE ng/mL (ref <100)
PH: 6.71 (ref 4.5–9.0)

## 2018-07-29 ENCOUNTER — Encounter: Payer: Self-pay | Admitting: *Deleted

## 2018-08-07 ENCOUNTER — Other Ambulatory Visit: Payer: Self-pay | Admitting: Family Medicine

## 2018-08-07 ENCOUNTER — Ambulatory Visit: Payer: BC Managed Care – PPO | Admitting: Psychology

## 2018-08-07 DIAGNOSIS — F411 Generalized anxiety disorder: Secondary | ICD-10-CM

## 2018-08-07 DIAGNOSIS — F4321 Adjustment disorder with depressed mood: Secondary | ICD-10-CM | POA: Diagnosis not present

## 2018-09-09 ENCOUNTER — Ambulatory Visit: Payer: BC Managed Care – PPO | Admitting: Medical

## 2018-09-09 ENCOUNTER — Encounter: Payer: Self-pay | Admitting: Medical

## 2018-09-09 VITALS — BP 118/67 | HR 71 | Temp 98.6°F | Resp 16 | Ht 60.0 in | Wt 115.2 lb

## 2018-09-09 DIAGNOSIS — R059 Cough, unspecified: Secondary | ICD-10-CM

## 2018-09-09 DIAGNOSIS — W57XXXA Bitten or stung by nonvenomous insect and other nonvenomous arthropods, initial encounter: Secondary | ICD-10-CM | POA: Diagnosis not present

## 2018-09-09 DIAGNOSIS — R05 Cough: Secondary | ICD-10-CM | POA: Diagnosis not present

## 2018-09-09 DIAGNOSIS — R509 Fever, unspecified: Secondary | ICD-10-CM

## 2018-09-09 DIAGNOSIS — S30861A Insect bite (nonvenomous) of abdominal wall, initial encounter: Secondary | ICD-10-CM

## 2018-09-09 MED ORDER — BENZONATATE 100 MG PO CAPS: 100.0000 mg | ORAL_CAPSULE | Freq: Three times a day (TID) | ORAL | 0 refills | Status: DC | PRN

## 2018-09-09 MED ORDER — DOXYCYCLINE HYCLATE 100 MG PO TABS: 100.0000 mg | ORAL_TABLET | Freq: Two times a day (BID) | ORAL | 0 refills | Status: DC

## 2018-09-09 NOTE — Patient Instructions (Addendum)
You appear to have bronchitis and sinusitis. Rest hydrate and tylenol for fever. I am prescribing cough medicine benzonatate, and doxycycline antibiotic. For your nasal congestion you could use otc the counter nasal steroid/flonase.   You should gradually get better. If not get recommend a chest xray on Friday and cbc  Follow up in 7-10 days or as needed

## 2018-09-09 NOTE — Progress Notes (Signed)
Subjective:    Patient ID: Adriana Lopez, female    DOB: [birth year], 57 y.o.   MRN: [id number]  HPI  Pt in for nasal congestion, sinus congestion/sinus pain and chest congestion. Some productive cough.  Fever, sweats and chills last night.   No wheezing noted.  Fatigued since Monday but also tired before then.   Review of Systems  Constitutional: Positive for chills, fatigue and fever. Negative for diaphoresis.  HENT: Positive for congestion, sinus pressure and sinus pain. Negative for sore throat.   Respiratory: Positive for cough. Negative for chest tightness, shortness of breath and wheezing.   Cardiovascular: Negative for chest pain and palpitations.  Gastrointestinal: Negative for abdominal pain.  Musculoskeletal: Negative for arthralgias, back pain, gait problem and myalgias.  Skin: Negative for pallor.  Neurological: Negative for dizziness, speech difficulty, weakness, light-headedness and numbness.  Hematological: Negative for adenopathy. Does not bruise/bleed easily.  Psychiatric/Behavioral: Negative for behavioral problems and confusion. The patient is not nervous/anxious.     Past Medical History:  Diagnosis Date  . Allergy    feline  . Anxiety      Social History   Socioeconomic History  . Marital status: Married    Spouse name: Not on file  . Number of children: Not on file  . Years of education: Not on file  . Highest education level: Not on file  Occupational History  . Not on file  Social Needs  . Financial resource strain: Not on file  . Food insecurity:    Worry: Not on file    Inability: Not on file  . Transportation needs:    Medical: Not on file    Non-medical: Not on file  Tobacco Use  . Smoking status: Former Smoker    Types: Cigarettes  . Smokeless tobacco: Never Used  Substance and Sexual Activity  . Alcohol use: Yes    Alcohol/week: 7.0 standard drinks    Types: 7 Glasses of wine per week  . Drug use: No  . Sexual activity:  Yes  Lifestyle  . Physical activity:    Days per week: Not on file    Minutes per session: Not on file  . Stress: Not on file  Relationships  . Social connections:    Talks on phone: Not on file    Gets together: Not on file    Attends religious service: Not on file    Active member of club or organization: Not on file    Attends meetings of clubs or organizations: Not on file    Relationship status: Not on file  . Intimate partner violence:    Fear of current or ex partner: Not on file    Emotionally abused: Not on file    Physically abused: Not on file    Forced sexual activity: Not on file  Other Topics Concern  . Not on file  Social History Narrative   Exercise- walking    Past Surgical History:  Procedure Laterality Date  . REFRACTIVE SURGERY  2012   Dr Lucita Ferrara  . SKIN CANCER EXCISION     right ear lobe  . TONSILLECTOMY AND ADENOIDECTOMY      Family History  Problem Relation Age of Onset  . Heart disease Father        pacer  . Cancer Father        dermatologic  . Cancer Maternal Aunt        uterine  . Alzheimer's disease Maternal Aunt   .  Alzheimer's disease Mother   . Diabetes Neg Hx   . Stroke Neg Hx   . Hypertension Neg Hx     No Known Allergies  Current Outpatient Medications on File Prior to Visit  Medication Sig Dispense Refill  . acetaminophen (TYLENOL) 500 MG tablet Take 500 mg by mouth every 6 (six) hours as needed.    Marland Kitchen acyclovir (ZOVIRAX) 200 MG capsule Take 1 capsule (200 mg total) by mouth 3 (three) times daily. 30 capsule 2  . ALPRAZolam (XANAX) 0.25 MG tablet Take 1 tablet (0.25 mg total) by mouth 3 (three) times daily. 30 tablet 0  . azelastine (ASTELIN) 0.1 % nasal spray PLACE 2 SPRAYS INTO BOTH NOSTRILS AT BEDTIME AS NEEDED FOR RHINITIS. USE IN EACH NOSTRIL AS DIRECTED 30 mL 1  . cetirizine (ZYRTEC) 10 MG tablet Take 10 mg by mouth daily.    . citalopram (CELEXA) 10 MG tablet TAKE 1 TABLET BY MOUTH DAILY *OFFICE VISIT DUE* 90 tablet  0  . doxycycline (VIBRA-TABS) 100 MG tablet Take 1 tablet (100 mg total) by mouth 2 (two) times daily. 20 tablet 0  . estradiol (VIVELLE-DOT) 0.05 MG/24HR patch APPLY 1 PATCH TWO TIMES A WEEK  0  . fluticasone (FLONASE) 50 MCG/ACT nasal spray Place 2 sprays into both nostrils daily. 16 g 6  . Nutritional Supplements (HRT SUPPORT PO) Take by mouth.    . progesterone (PROMETRIUM) 100 MG capsule     . zolpidem (AMBIEN) 5 MG tablet TAKE 1 TABLET BY MOUTH EVERY 3 NIGHTS AS NEEDED 30 tablet 0   No current facility-administered medications on file prior to visit.     BP 118/67   Pulse 71   Temp 98.6 F (37 C) (Oral)   Resp 16   Ht 5' (1.524 m)   Wt 115 lb 3.2 oz (52.3 kg)   SpO2 100%   BMI 22.50 kg/m       Objective:   Physical Exam   General  Mental Status - Alert. General Appearance - Well groomed. Not in acute distress.  Skin Rashes- No Rashes.  HEENT Head- Normal. Ear Auditory Canal - Left- Normal. Right - Normal.Tympanic Membrane- Left- Normal. Right- Normal. Eye Sclera/Conjunctiva- Left- Normal. Right- Normal. Nose & Sinuses Nasal Mucosa- Left-  Boggy and Congested. Right-  Boggy and  Congested.Bilateral maxillary and frontal sinus pressure. Mouth & Throat Lips: Upper Lip- Normal: no dryness, cracking, pallor, cyanosis, or vesicular eruption. Lower Lip-Normal: no dryness, cracking, pallor, cyanosis or vesicular eruption. Buccal Mucosa- Bilateral- No Aphthous ulcers. Oropharynx- No Discharge or Erythema. Tonsils: Characteristics- Bilateral- No Erythema or Congestion. Size/Enlargement- Bilateral- No enlargement. Discharge- bilateral-None.  Neck Neck- Supple. No Masses.   Chest and Lung Exam Auscultation: Breath Sounds:-Clear even and unlabored.  Cardiovascular Auscultation:Rythm- Regular, rate and rhythm. Murmurs & Other Heart Sounds:Ausculatation of the heart reveal- No Murmurs.  Lymphatic Head & Neck General Head & Neck Lymphatics: Bilateral: Description-  No Localized lymphadenopathy.     Assessment & Plan:   You appear to have bronchitis and sinusitis. Rest hydrate and tylenol for fever. I am prescribing cough medicine benzonatate, and doxycycline antibiotic. For your nasal congestion you could use otc the counter nasal steroid/flonase.   You should gradually get better. If not get recommend a chest xray on Friday and cbc  Follow up in 7-10 days or as needed  [employer], PA-C

## 2018-09-10 ENCOUNTER — Telehealth: Payer: Self-pay | Admitting: *Deleted

## 2018-09-10 ENCOUNTER — Ambulatory Visit (HOSPITAL_BASED_OUTPATIENT_CLINIC_OR_DEPARTMENT_OTHER)
Admission: RE | Admit: 2018-09-10 | Discharge: 2018-09-10 | Disposition: A | Discharge status: Home / Self Care | Payer: BC Managed Care – PPO | Source: Ambulatory Visit | Attending: Medical | Admitting: Medical

## 2018-09-10 DIAGNOSIS — R05 Cough: Secondary | ICD-10-CM | POA: Insufficient documentation

## 2018-09-10 DIAGNOSIS — R0989 Other specified symptoms and signs involving the circulatory and respiratory systems: Secondary | ICD-10-CM | POA: Diagnosis not present

## 2018-09-10 DIAGNOSIS — R059 Cough, unspecified: Secondary | ICD-10-CM

## 2018-09-10 NOTE — Telephone Encounter (Signed)
Copied from Echo [id number]. Topic: General - Inquiry >> Sep 10, 2018  2:51 PM Reyne Dumas L wrote: Reason for CRM:   Pt calling to get x-ray results.  States she can be reached at [phone]

## 2018-09-11 ENCOUNTER — Ambulatory Visit: Payer: BC Managed Care – PPO | Admitting: Psychology

## 2018-09-11 NOTE — Telephone Encounter (Signed)
See result note from 09/11/18 informing pt of result at 12:13pm.

## 2018-09-18 ENCOUNTER — Ambulatory Visit: Payer: Self-pay | Admitting: *Deleted

## 2018-09-18 NOTE — Telephone Encounter (Signed)
Pt returned call and was scheduled for follow up visit for sinus infection on Saturday.

## 2018-09-18 NOTE — Telephone Encounter (Signed)
Pt stated she needs more of the antibiotics since there is no appt availability with pcp until next week. 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 Pt requests call back to advise if another round of the antibiotic can be sent to her pharmacy. Pt requests call back after 3:30 pm. Cb# [phone]   Attempted to call pt to discuss symptoms but no answer at this time.Mailbox is full and unable to accept messages at this time.

## 2018-09-19 ENCOUNTER — Encounter: Payer: Self-pay | Admitting: Family Medicine

## 2018-09-19 ENCOUNTER — Ambulatory Visit: Payer: BC Managed Care – PPO | Admitting: Family Medicine

## 2018-09-19 VITALS — BP 118/72 | HR 67 | Temp 98.0°F | Ht 60.0 in | Wt 118.0 lb

## 2018-09-19 DIAGNOSIS — J019 Acute sinusitis, unspecified: Secondary | ICD-10-CM | POA: Diagnosis not present

## 2018-09-19 MED ORDER — AMOXICILLIN-POT CLAVULANATE 875-125 MG PO TABS: 1.0000 | ORAL_TABLET | Freq: Two times a day (BID) | ORAL | 0 refills | Status: DC

## 2018-09-19 NOTE — Progress Notes (Signed)
   Subjective:    Patient ID: Adriana Lopez, female    DOB: 10/05/1961, 57 y.o.   MRN: [id number]  HPI Here for continued sinus congestion, PND, and a dry cough. No fever. She was seen on 09-09-18 and was given a course of Doxycycline. This helped a little but the symptoms have persisted.    Review of Systems  Constitutional: Negative.   HENT: Positive for congestion, postnasal drip and sinus pressure. Negative for sinus pain and sore throat.   Eyes: Negative.   Respiratory: Positive for cough.        Objective:   Physical Exam  Constitutional: She appears well-developed and well-nourished.  HENT:  Right Ear: External ear normal.  Left Ear: External ear normal.  Nose: Nose normal.  Mouth/Throat: Oropharynx is clear and moist.  Eyes: Conjunctivae are normal.  Neck: No thyromegaly present.  Pulmonary/Chest: Effort normal and breath sounds normal. No stridor. No respiratory distress. She has no wheezes. She has no rales.  Lymphadenopathy:    She has no cervical adenopathy.          Assessment & Plan:  Partially treated sinusitis, given 10 days of Augmentin. Add Mucinex prn. Alysia Penna, MD

## 2018-09-22 ENCOUNTER — Ambulatory Visit: Payer: BC Managed Care – PPO | Admitting: Psychology

## 2018-09-22 DIAGNOSIS — F4323 Adjustment disorder with mixed anxiety and depressed mood: Secondary | ICD-10-CM | POA: Diagnosis not present

## 2018-10-02 ENCOUNTER — Ambulatory Visit: Payer: BC Managed Care – PPO | Admitting: Psychology

## 2018-10-02 DIAGNOSIS — F4323 Adjustment disorder with mixed anxiety and depressed mood: Secondary | ICD-10-CM | POA: Diagnosis not present

## 2018-10-16 ENCOUNTER — Ambulatory Visit: Payer: BC Managed Care – PPO | Admitting: Family Medicine

## 2018-10-16 ENCOUNTER — Encounter: Payer: Self-pay | Admitting: Family Medicine

## 2018-10-16 VITALS — BP 110/68 | HR 68 | Temp 98.6°F | Resp 16 | Ht 64.0 in | Wt 113.6 lb

## 2018-10-16 DIAGNOSIS — J0140 Acute pansinusitis, unspecified: Secondary | ICD-10-CM

## 2018-10-16 MED ORDER — LEVOFLOXACIN 500 MG PO TABS: 500.0000 mg | ORAL_TABLET | Freq: Every day | ORAL | 0 refills | Status: DC

## 2018-10-16 MED ORDER — FLUTICASONE PROPIONATE 50 MCG/ACT NA SUSP: 2.0000 | Freq: Every day | NASAL | 6 refills | Status: DC

## 2018-10-16 MED ORDER — METHYLPREDNISOLONE ACETATE 80 MG/ML IJ SUSP
80.0000 mg | Freq: Once | INTRAMUSCULAR | Status: AC
  Administered 2018-10-16: 80 mg via INTRAMUSCULAR

## 2018-10-16 NOTE — Patient Instructions (Signed)
Sinusitis, Adult  Sinusitis is inflammation of your sinuses. Sinuses are hollow spaces in the bones around your face. Your sinuses are located:   Around your eyes.   In the middle of your forehead.   Behind your nose.   In your cheekbones.  Mucus normally drains out of your sinuses. When your nasal tissues become inflamed or swollen, mucus can become trapped or blocked. This allows bacteria, viruses, and fungi to grow, which leads to infection. Most infections of the sinuses are caused by a virus.  Sinusitis can develop quickly. It can last for up to 4 weeks (acute) or for more than 12 weeks (chronic). Sinusitis often develops after a cold.  What are the causes?  This condition is caused by anything that creates swelling in the sinuses or stops mucus from draining. This includes:   Allergies.   Asthma.   Infection from bacteria or viruses.   Deformities or blockages in your nose or sinuses.   Abnormal growths in the nose (nasal polyps).   Pollutants, such as chemicals or irritants in the air.   Infection from fungi (rare).  What increases the risk?  You are more likely to develop this condition if you:   Have a weak body defense system (immune system).   Do a lot of swimming or diving.   Overuse nasal sprays.   Smoke.  What are the signs or symptoms?  The main symptoms of this condition are pain and a feeling of pressure around the affected sinuses. Other symptoms include:   Stuffy nose or congestion.   Thick drainage from your nose.   Swelling and warmth over the affected sinuses.   Headache.   Upper toothache.   A cough that may get worse at night.   Extra mucus that collects in the throat or the back of the nose (postnasal drip).   Decreased sense of smell and taste.   Fatigue.   A fever.   Sore throat.   Bad breath.  How is this diagnosed?  This condition is diagnosed based on:   Your symptoms.   Your medical history.   A physical exam.   Tests to find out if your condition is  acute or chronic. This may include:  ? Checking your nose for nasal polyps.  ? Viewing your sinuses using a device that has a light (endoscope).  ? Testing for allergies or bacteria.  ? Imaging tests, such as an MRI or CT scan.  In rare cases, a bone biopsy may be done to rule out more serious types of fungal sinus disease.  How is this treated?  Treatment for sinusitis depends on the cause and whether your condition is chronic or acute.   If caused by a virus, your symptoms should go away on their own within 10 days. You may be given medicines to relieve symptoms. They include:  ? Medicines that shrink swollen nasal passages (topical intranasal decongestants).  ? Medicines that treat allergies (antihistamines).  ? A spray that eases inflammation of the nostrils (topical intranasal corticosteroids).  ? Rinses that help get rid of thick mucus in your nose (nasal saline washes).   If caused by bacteria, your health care provider may recommend waiting to see if your symptoms improve. Most bacterial infections will get better without antibiotic medicine. You may be given antibiotics if you have:  ? A severe infection.  ? A weak immune system.   If caused by narrow nasal passages or nasal polyps, you may need   to have surgery.  Follow these instructions at home:  Medicines   Take, use, or apply over-the-counter and prescription medicines only as told by your health care provider. These may include nasal sprays.   If you were prescribed an antibiotic medicine, take it as told by your health care provider. Do not stop taking the antibiotic even if you start to feel better.  Hydrate and humidify     Drink enough fluid to keep your urine pale yellow. Staying hydrated will help to thin your mucus.   Use a cool mist humidifier to keep the humidity level in your home above 50%.   Inhale steam for 10-15 minutes, 3-4 times a day, or as told by your health care provider. You can do this in the bathroom while a hot shower is  running.   Limit your exposure to cool or dry air.  Rest   Rest as much as possible.   Sleep with your head raised (elevated).   Make sure you get enough sleep each night.  General instructions     Apply a warm, moist washcloth to your face 3-4 times a day or as told by your health care provider. This will help with discomfort.   Wash your hands often with soap and water to reduce your exposure to germs. If soap and water are not available, use hand sanitizer.   Do not smoke. Avoid being around people who are smoking (secondhand smoke).   Keep all follow-up visits as told by your health care provider. This is important.  Contact a health care provider if:   You have a fever.   Your symptoms get worse.   Your symptoms do not improve within 10 days.  Get help right away if:   You have a severe headache.   You have persistent vomiting.   You have severe pain or swelling around your face or eyes.   You have vision problems.   You develop confusion.   Your neck is stiff.   You have trouble breathing.  Summary   Sinusitis is soreness and inflammation of your sinuses. Sinuses are hollow spaces in the bones around your face.   This condition is caused by nasal tissues that become inflamed or swollen. The swelling traps or blocks the flow of mucus. This allows bacteria, viruses, and fungi to grow, which leads to infection.   If you were prescribed an antibiotic medicine, take it as told by your health care provider. Do not stop taking the antibiotic even if you start to feel better.   Keep all follow-up visits as told by your health care provider. This is important.  This information is not intended to replace advice given to you by your health care provider. Make sure you discuss any questions you have with your health care provider.  Document Released: 10/14/2005 Document Revised: 03/16/2018 Document Reviewed: 03/16/2018  [employer] Interactive Patient Education  2019 [employer]

## 2018-10-16 NOTE — Progress Notes (Signed)
Patient ID: Adriana Lopez, female    DOB: Mar 22, 1961  Age: 57 y.o. MRN: [id number]    Subjective:  Subjective  HPI Adriana Lopez presents for persistant sinus symptoms.  She has had doxy and augmentin --- the augmentin gave her diarrhea  Review of Systems  Constitutional: Positive for chills. Negative for fever.  HENT: Positive for congestion, postnasal drip, rhinorrhea and sinus pressure.   Respiratory: Negative for cough, chest tightness, shortness of breath and wheezing.   Cardiovascular: Negative for chest pain, palpitations and leg swelling.  Allergic/Immunologic: Negative for environmental allergies.    History Past Medical History:  Diagnosis Date  . Allergy    feline  . Anxiety     She has a past surgical history that includes Tonsillectomy and adenoidectomy; Refractive surgery (2012); and Skin cancer excision.   Her family history includes Alzheimer's disease in her maternal aunt and mother; Cancer in her father and maternal aunt; Heart disease in her father.She reports that she has quit smoking. Her smoking use included cigarettes. She has never used smokeless tobacco. She reports current alcohol use of about 7.0 standard drinks of alcohol per week. She reports that she does not use drugs.  Current Outpatient Medications on File Prior to Visit  Medication Sig Dispense Refill  . acetaminophen (TYLENOL) 500 MG tablet Take 500 mg by mouth every 6 (six) hours as needed.    Marland Kitchen acyclovir (ZOVIRAX) 200 MG capsule Take 1 capsule (200 mg total) by mouth 3 (three) times daily. 30 capsule 2  . ALPRAZolam (XANAX) 0.25 MG tablet Take 1 tablet (0.25 mg total) by mouth 3 (three) times daily. 30 tablet 0  . azelastine (ASTELIN) 0.1 % nasal spray PLACE 2 SPRAYS INTO BOTH NOSTRILS AT BEDTIME AS NEEDED FOR RHINITIS. USE IN EACH NOSTRIL AS DIRECTED 30 mL 1  . benzonatate (TESSALON) 100 MG capsule Take 1 capsule (100 mg total) by mouth 3 (three) times daily as needed for cough. 30  capsule 0  . cetirizine (ZYRTEC) 10 MG tablet Take 10 mg by mouth daily.    . citalopram (CELEXA) 10 MG tablet TAKE 1 TABLET BY MOUTH DAILY *OFFICE VISIT DUE* 90 tablet 0  . estradiol (VIVELLE-DOT) 0.05 MG/24HR patch APPLY 1 PATCH TWO TIMES A WEEK  0  . Nutritional Supplements (HRT SUPPORT PO) Take by mouth.    . progesterone (PROMETRIUM) 100 MG capsule     . zolpidem (AMBIEN) 5 MG tablet TAKE 1 TABLET BY MOUTH EVERY 3 NIGHTS AS NEEDED 30 tablet 0   No current facility-administered medications on file prior to visit.      Objective:  Objective  Physical Exam Vitals signs and nursing note reviewed.  Constitutional:      Appearance: She is well-developed. She is diaphoretic.  HENT:     Right Ear: External ear normal.     Left Ear: External ear normal.     Nose: Mucosal edema, congestion and rhinorrhea present. No nasal deformity.     Right Turbinates: Swollen.     Left Turbinates: Swollen.     Right Sinus: Maxillary sinus tenderness and frontal sinus tenderness present.     Left Sinus: Maxillary sinus tenderness and frontal sinus tenderness present.     Mouth/Throat:     Pharynx: Posterior oropharyngeal erythema present. No oropharyngeal exudate.     Comments: PND  Eyes:     General:        Right eye: No discharge.        Left eye:  No discharge.     Conjunctiva/sclera: Conjunctivae normal.  Neck:     Musculoskeletal: Normal range of motion and neck supple.  Cardiovascular:     Rate and Rhythm: Normal rate and regular rhythm.     Heart sounds: Normal heart sounds. No murmur.  Pulmonary:     Effort: Pulmonary effort is normal. No respiratory distress.     Breath sounds: Normal breath sounds. No wheezing or rales.  Chest:     Chest wall: No tenderness.  Lymphadenopathy:     Cervical: Cervical adenopathy present.  Skin:    General: Skin is warm.  Neurological:     Mental Status: She is alert and oriented to person, place, and time.    BP 110/68 (BP Location: Left Arm,  Cuff Size: Normal)   Pulse 68   Temp 98.6 F (37 C) (Oral)   Resp 16   Ht 5\' 4"  (1.626 m)   Wt 113 lb 9.6 oz (51.5 kg)   SpO2 98%   BMI 19.50 kg/m  Wt Readings from Last 3 Encounters:  10/16/18 113 lb 9.6 oz (51.5 kg)  09/19/18 118 lb (53.5 kg)  09/09/18 115 lb 3.2 oz (52.3 kg)     Lab Results  Component Value Date   WBC 7.7 07/21/2018   HGB 13.6 07/21/2018   HCT 40.0 07/21/2018   PLT 313.0 07/21/2018   GLUCOSE 92 07/21/2018   CHOL 172 07/21/2018   TRIG 62.0 07/21/2018   HDL 98.30 07/21/2018   LDLCALC 61 07/21/2018   ALT 20 07/21/2018   AST 24 07/21/2018   NA 138 07/21/2018   K 3.9 07/21/2018   CL 100 07/21/2018   CREATININE 0.69 07/21/2018   BUN 25 (H) 07/21/2018   CO2 28 07/21/2018   TSH 3.38 07/21/2018    Dg Chest 2 View  Result Date: 09/10/2018 CLINICAL DATA:  Cough, congestion, fever EXAM: CHEST - 2 VIEW COMPARISON:  Chest x-ray of 10/13/2012 FINDINGS: No active infiltrate or effusion is seen. Mediastinal and hilar contours are unremarkable. The heart is within normal limits in size. No bony abnormality is seen. IMPRESSION: No active cardiopulmonary disease. Electronically Signed   By: Ivar Drape M.D.   On: 09/10/2018 11:05     Assessment & Plan:  Plan  I have discontinued Royetta Crochet. Gangl's fluticasone and amoxicillin-clavulanate. I am also having her start on levofloxacin and fluticasone. Additionally, I am having her maintain her Nutritional Supplements (HRT SUPPORT PO), acetaminophen, cetirizine, acyclovir, estradiol, progesterone, ALPRAZolam, azelastine, zolpidem, citalopram, and benzonatate. We administered methylPREDNISolone acetate.  Meds ordered this encounter  Medications  . levofloxacin (LEVAQUIN) 500 MG tablet    Sig: Take 1 tablet (500 mg total) by mouth daily.    Dispense:  7 tablet    Refill:  0  . fluticasone (FLONASE) 50 MCG/ACT nasal spray    Sig: Place 2 sprays into both nostrils daily.    Dispense:  16 g    Refill:  6  .  methylPREDNISolone acetate (DEPO-MEDROL) injection 80 mg    Problem List Items Addressed This Visit    None    Visit Diagnoses    Acute pansinusitis, recurrence not specified    -  Primary   Relevant Medications   levofloxacin (LEVAQUIN) 500 MG tablet   fluticasone (FLONASE) 50 MCG/ACT nasal spray   methylPREDNISolone acetate (DEPO-MEDROL) injection 80 mg (Completed)    depo medrol 80 mg IM con't zyrtec   Follow-up: Return if symptoms worsen or fail to improve.  Kendrick Fries  Carrizales, DO

## 2018-10-22 ENCOUNTER — Ambulatory Visit: Payer: BC Managed Care – PPO | Admitting: Psychology

## 2018-10-22 DIAGNOSIS — F4321 Adjustment disorder with depressed mood: Secondary | ICD-10-CM | POA: Diagnosis not present

## 2018-11-06 ENCOUNTER — Other Ambulatory Visit: Payer: Self-pay | Admitting: Family Medicine

## 2018-11-06 DIAGNOSIS — F411 Generalized anxiety disorder: Secondary | ICD-10-CM

## 2018-11-24 ENCOUNTER — Ambulatory Visit: Payer: BC Managed Care – PPO | Admitting: Psychology

## 2018-11-24 DIAGNOSIS — F4321 Adjustment disorder with depressed mood: Secondary | ICD-10-CM

## 2018-12-22 ENCOUNTER — Ambulatory Visit: Payer: BC Managed Care – PPO | Admitting: Family Medicine

## 2018-12-22 ENCOUNTER — Encounter: Payer: Self-pay | Admitting: Family Medicine

## 2018-12-22 ENCOUNTER — Ambulatory Visit (HOSPITAL_BASED_OUTPATIENT_CLINIC_OR_DEPARTMENT_OTHER)
Admission: RE | Admit: 2018-12-22 | Discharge: 2018-12-22 | Disposition: A | Discharge status: Home / Self Care | Payer: BC Managed Care – PPO | Source: Ambulatory Visit | Attending: Family Medicine | Admitting: Family Medicine

## 2018-12-22 VITALS — BP 126/76 | HR 60 | Temp 97.3°F | Resp 14 | Ht 64.0 in | Wt 114.0 lb

## 2018-12-22 DIAGNOSIS — R05 Cough: Secondary | ICD-10-CM

## 2018-12-22 DIAGNOSIS — J40 Bronchitis, not specified as acute or chronic: Secondary | ICD-10-CM | POA: Diagnosis not present

## 2018-12-22 DIAGNOSIS — R059 Cough, unspecified: Secondary | ICD-10-CM

## 2018-12-22 MED ORDER — PREDNISONE 10 MG PO TABS: ORAL_TABLET | ORAL | 0 refills | Status: DC

## 2018-12-22 MED ORDER — AZITHROMYCIN 250 MG PO TABS: ORAL_TABLET | ORAL | 0 refills | Status: DC

## 2018-12-22 NOTE — Progress Notes (Signed)
Patient ID: Adriana Lopez, female    DOB: 1961/02/22  Age: 58 y.o. MRN: [id number]    Subjective:  Subjective  HPI Adriana Lopez presents for cough , congestion , no fever  Cough not productive no sinus congestion , no sore throat   Review of Systems  Constitutional: Negative for chills and fever.  HENT: Negative for postnasal drip, rhinorrhea and sinus pressure.   Respiratory: Positive for cough, chest tightness, shortness of breath and wheezing.   Cardiovascular: Negative for chest pain, palpitations and leg swelling.  Allergic/Immunologic: Negative for environmental allergies.    History Past Medical History:  Diagnosis Date  . Allergy    feline  . Anxiety     She has a past surgical history that includes Tonsillectomy and adenoidectomy; Refractive surgery (2012); and Skin cancer excision.   Her family history includes Alzheimer's disease in her maternal aunt and mother; Cancer in her father and maternal aunt; Heart disease in her father.She reports that she has quit smoking. Her smoking use included cigarettes. She has never used smokeless tobacco. She reports current alcohol use of about 7.0 standard drinks of alcohol per week. She reports that she does not use drugs.  Current Outpatient Medications on File Prior to Visit  Medication Sig Dispense Refill  . acetaminophen (TYLENOL) 500 MG tablet Take 500 mg by mouth every 6 (six) hours as needed.    Marland Kitchen acyclovir (ZOVIRAX) 200 MG capsule Take 1 capsule (200 mg total) by mouth 3 (three) times daily. 30 capsule 2  . ALPRAZolam (XANAX) 0.25 MG tablet Take 1 tablet (0.25 mg total) by mouth 3 (three) times daily. 30 tablet 0  . azelastine (ASTELIN) 0.1 % nasal spray PLACE 2 SPRAYS INTO BOTH NOSTRILS AT BEDTIME AS NEEDED FOR RHINITIS. USE IN EACH NOSTRIL AS DIRECTED 30 mL 1  . benzonatate (TESSALON) 100 MG capsule Take 1 capsule (100 mg total) by mouth 3 (three) times daily as needed for cough. 30 capsule 0  . cetirizine (ZYRTEC)  10 MG tablet Take 10 mg by mouth daily.    . citalopram (CELEXA) 10 MG tablet TAKE 1 TABLET BY MOUTH DAILY *OFFICE VISIT DUE* 90 tablet 1  . estradiol (VIVELLE-DOT) 0.05 MG/24HR patch APPLY 1 PATCH TWO TIMES A WEEK  0  . fluticasone (FLONASE) 50 MCG/ACT nasal spray Place 2 sprays into both nostrils daily. 16 g 6  . levofloxacin (LEVAQUIN) 500 MG tablet Take 1 tablet (500 mg total) by mouth daily. 7 tablet 0  . Nutritional Supplements (HRT SUPPORT PO) Take by mouth.    . progesterone (PROMETRIUM) 100 MG capsule     . zolpidem (AMBIEN) 5 MG tablet TAKE 1 TABLET BY MOUTH EVERY 3 NIGHTS AS NEEDED 30 tablet 0   No current facility-administered medications on file prior to visit.      Objective:  Objective  Physical Exam Vitals signs and nursing note reviewed.  Constitutional:      Appearance: She is well-developed.  HENT:     Right Ear: External ear normal.     Left Ear: External ear normal.  Eyes:     General:        Right eye: No discharge.        Left eye: No discharge.     Conjunctiva/sclera: Conjunctivae normal.  Cardiovascular:     Rate and Rhythm: Normal rate and regular rhythm.     Heart sounds: Normal heart sounds. No murmur.  Pulmonary:     Effort: Pulmonary effort is normal.  No respiratory distress.     Breath sounds: Decreased breath sounds and wheezing present. No rales.    Chest:     Chest wall: No tenderness.  Lymphadenopathy:     Cervical: Cervical adenopathy present.  Neurological:     Mental Status: She is alert and oriented to person, place, and time.    BP 126/76 (BP Location: Right Arm, Patient Position: Sitting, Cuff Size: Normal)   Pulse 60   Temp (!) 97.3 F (36.3 C)   Resp 14   Ht 5\' 4"  (1.626 m)   Wt 114 lb (51.7 kg)   SpO2 97%   BMI 19.57 kg/m  Wt Readings from Last 3 Encounters:  12/22/18 114 lb (51.7 kg)  10/16/18 113 lb 9.6 oz (51.5 kg)  09/19/18 118 lb (53.5 kg)     Lab Results  Component Value Date   WBC 7.7 07/21/2018   HGB  13.6 07/21/2018   HCT 40.0 07/21/2018   PLT 313.0 07/21/2018   GLUCOSE 92 07/21/2018   CHOL 172 07/21/2018   TRIG 62.0 07/21/2018   HDL 98.30 07/21/2018   LDLCALC 61 07/21/2018   ALT 20 07/21/2018   AST 24 07/21/2018   NA 138 07/21/2018   K 3.9 07/21/2018   CL 100 07/21/2018   CREATININE 0.69 07/21/2018   BUN 25 (H) 07/21/2018   CO2 28 07/21/2018   TSH 3.38 07/21/2018    Dg Chest 2 View  Result Date: 12/22/2018 CLINICAL DATA:  Cough, wheezing, shortness of breath EXAM: CHEST - 2 VIEW COMPARISON:  09/10/2018 FINDINGS: Heart and mediastinal contours are within normal limits. No focal opacities or effusions. No acute bony abnormality. IMPRESSION: No active cardiopulmonary disease. Electronically Signed   By: Rolm Baptise M.D.   On: 12/22/2018 17:49     Assessment & Plan:  Plan  I am having Adriana Lopez start on azithromycin and predniSONE. I am also having her maintain her Nutritional Supplements (HRT SUPPORT PO), acetaminophen, cetirizine, acyclovir, estradiol, progesterone, ALPRAZolam, azelastine, zolpidem, benzonatate, levofloxacin, fluticasone, and citalopram.  Meds ordered this encounter  Medications  . azithromycin (ZITHROMAX Z-PAK) 250 MG tablet    Sig: As directed    Dispense:  6 each    Refill:  0  . predniSONE (DELTASONE) 10 MG tablet    Sig: TAKE 3 TABLETS PO QD FOR 3 DAYS THEN TAKE 2 TABLETS PO QD FOR 3 DAYS THEN TAKE 1 TABLET PO QD FOR 3 DAYS THEN TAKE 1/2 TAB PO QD FOR 3 DAYS    Dispense:  20 tablet    Refill:  0    Problem List Items Addressed This Visit    None    Visit Diagnoses    Bronchitis    -  Primary   Relevant Medications   azithromycin (ZITHROMAX Z-PAK) 250 MG tablet   predniSONE (DELTASONE) 10 MG tablet   Cough       Relevant Orders   DG Chest 2 View (Completed)      Follow-up: No follow-ups on file.  Ann Held, DO

## 2018-12-22 NOTE — Patient Instructions (Signed)
Acute Bronchitis, Adult Acute bronchitis is when air tubes (bronchi) in the lungs suddenly get swollen. The condition can make it hard to breathe. It can also cause these symptoms:  A cough.  Coughing up clear, yellow, or green mucus.  Wheezing.  Chest congestion.  Shortness of breath.  A fever.  Body aches.  Chills.  A sore throat. Follow these instructions at home:  Medicines  Take over-the-counter and prescription medicines only as told by your doctor.  If you were prescribed an antibiotic medicine, take it as told by your doctor. Do not stop taking the antibiotic even if you start to feel better. General instructions  Rest.  Drink enough fluids to keep your pee (urine) pale yellow.  Avoid smoking and secondhand smoke. If you smoke and you need help quitting, ask your doctor. Quitting will help your lungs heal faster.  Use an inhaler, cool mist vaporizer, or humidifier as told by your doctor.  Keep all follow-up visits as told by your doctor. This is important. How is this prevented? To lower your risk of getting this condition again:  Wash your hands often with soap and water. If you cannot use soap and water, use hand sanitizer.  Avoid contact with people who have cold symptoms.  Try not to touch your hands to your mouth, nose, or eyes.  Make sure to get the flu shot every year. Contact a doctor if:  Your symptoms do not get better in 2 weeks. Get help right away if:  You cough up blood.  You have chest pain.  You have very bad shortness of breath.  You become dehydrated.  You faint (pass out) or keep feeling like you are going to pass out.  You keep throwing up (vomiting).  You have a very bad headache.  Your fever or chills gets worse. This information is not intended to replace advice given to you by your health care provider. Make sure you discuss any questions you have with your health care provider. Document Released: 04/01/2008 Document  Revised: 05/28/2017 Document Reviewed: 04/03/2016 Elsevier Interactive Patient Education  2019 Elsevier Inc.  

## 2018-12-28 ENCOUNTER — Ambulatory Visit: Payer: BC Managed Care – PPO | Admitting: Psychology

## 2018-12-28 DIAGNOSIS — F4321 Adjustment disorder with depressed mood: Secondary | ICD-10-CM

## 2019-01-12 ENCOUNTER — Other Ambulatory Visit: Payer: Self-pay | Admitting: Family Medicine

## 2019-01-12 DIAGNOSIS — F411 Generalized anxiety disorder: Secondary | ICD-10-CM

## 2019-01-12 NOTE — Telephone Encounter (Signed)
Requesting: Xanax Contract: Yes UDS: Yes, next screen 01/19/2019 Last OV: 12/22/2018 Next OV: N/A Last Refill: 01/22/2018, #30--0RF Database:   Please advise

## 2019-01-15 ENCOUNTER — Ambulatory Visit: Payer: BC Managed Care – PPO | Admitting: Psychology

## 2019-01-15 ENCOUNTER — Other Ambulatory Visit: Payer: Self-pay

## 2019-01-15 DIAGNOSIS — F4321 Adjustment disorder with depressed mood: Secondary | ICD-10-CM | POA: Diagnosis not present

## 2019-01-25 ENCOUNTER — Ambulatory Visit (INDEPENDENT_AMBULATORY_CARE_PROVIDER_SITE_OTHER): Payer: BC Managed Care – PPO | Admitting: Psychology

## 2019-01-25 DIAGNOSIS — F4323 Adjustment disorder with mixed anxiety and depressed mood: Secondary | ICD-10-CM

## 2019-02-03 ENCOUNTER — Ambulatory Visit (INDEPENDENT_AMBULATORY_CARE_PROVIDER_SITE_OTHER): Payer: BC Managed Care – PPO | Admitting: Psychology

## 2019-02-03 DIAGNOSIS — F4323 Adjustment disorder with mixed anxiety and depressed mood: Secondary | ICD-10-CM | POA: Diagnosis not present

## 2019-02-15 ENCOUNTER — Ambulatory Visit (INDEPENDENT_AMBULATORY_CARE_PROVIDER_SITE_OTHER): Payer: BC Managed Care – PPO | Admitting: Psychology

## 2019-02-15 DIAGNOSIS — F4321 Adjustment disorder with depressed mood: Secondary | ICD-10-CM

## 2019-02-22 ENCOUNTER — Ambulatory Visit (INDEPENDENT_AMBULATORY_CARE_PROVIDER_SITE_OTHER): Payer: BC Managed Care – PPO | Admitting: Psychology

## 2019-02-22 DIAGNOSIS — F4323 Adjustment disorder with mixed anxiety and depressed mood: Secondary | ICD-10-CM

## 2019-03-01 ENCOUNTER — Ambulatory Visit (INDEPENDENT_AMBULATORY_CARE_PROVIDER_SITE_OTHER): Payer: BC Managed Care – PPO | Admitting: Psychology

## 2019-03-01 DIAGNOSIS — F4321 Adjustment disorder with depressed mood: Secondary | ICD-10-CM | POA: Diagnosis not present

## 2019-03-08 ENCOUNTER — Ambulatory Visit (INDEPENDENT_AMBULATORY_CARE_PROVIDER_SITE_OTHER): Payer: BC Managed Care – PPO | Admitting: Psychology

## 2019-03-08 DIAGNOSIS — F4321 Adjustment disorder with depressed mood: Secondary | ICD-10-CM

## 2019-03-16 ENCOUNTER — Ambulatory Visit: Payer: BC Managed Care – PPO | Admitting: Psychology

## 2019-03-24 ENCOUNTER — Ambulatory Visit (INDEPENDENT_AMBULATORY_CARE_PROVIDER_SITE_OTHER): Payer: BC Managed Care – PPO | Admitting: Psychology

## 2019-03-24 DIAGNOSIS — F4321 Adjustment disorder with depressed mood: Secondary | ICD-10-CM

## 2019-03-29 ENCOUNTER — Telehealth: Payer: Self-pay | Admitting: *Deleted

## 2019-03-29 ENCOUNTER — Telehealth: Payer: Self-pay | Admitting: Family Medicine

## 2019-03-29 ENCOUNTER — Other Ambulatory Visit: Payer: Self-pay | Admitting: Family Medicine

## 2019-03-29 DIAGNOSIS — G4700 Insomnia, unspecified: Secondary | ICD-10-CM

## 2019-03-29 MED ORDER — ZOLPIDEM TARTRATE 5 MG PO TABS: ORAL_TABLET | ORAL | 0 refills | Status: DC

## 2019-03-29 NOTE — Telephone Encounter (Signed)
Refilled Database reviewed

## 2019-03-29 NOTE — Telephone Encounter (Signed)
Copied from [place] [id number]. Topic: Quick Communication - Rx Refill/Question >> Mar 29, 2019 10:38 AM Lionel December wrote: Medication: zolpidem (AMBIEN) 5 MG tablet  Has the patient contacted their pharmacy? Yes.   (Agent: If no, request that the patient contact the pharmacy for the refill.) (Agent: If yes, when and what did the pharmacy advise?)  Preferred Pharmacy (with phone number or street name): [id number] [address], [place] [phone] (Phone) [phone] (Fax    Agent: Please be advised that RX refills may take up to 3 business days. We ask that you follow-up with your pharmacy.

## 2019-03-29 NOTE — Telephone Encounter (Signed)
Last zolpidem RX: 07/21/18, #30 Last OV: 12/22/18 Next OV: none scheduled UDS: 07/21/18. ? Due now CSC: 07/21/18    Copied from [place] #[id number]. Topic: Quick Communication - Rx Refill/Question >> Mar 29, 2019 10:38 AM Lionel December wrote: Medication: zolpidem (AMBIEN) 5 MG tablet  Has the patient contacted their pharmacy? Yes.   (Agent: If no, request that the patient contact the pharmacy for the refill.) (Agent: If yes, when and what did the pharmacy advise?)  Preferred Pharmacy (with phone number or street name): [id number] [address], [place] [phone] (Phone) [phone] (Fax    Agent: Please be advised that RX refills may take up to 3 business days. We ask that you follow-up with your pharmacy.

## 2019-04-02 ENCOUNTER — Ambulatory Visit (INDEPENDENT_AMBULATORY_CARE_PROVIDER_SITE_OTHER): Payer: BC Managed Care – PPO | Admitting: Family

## 2019-04-02 ENCOUNTER — Other Ambulatory Visit: Payer: Self-pay

## 2019-04-02 DIAGNOSIS — H00011 Hordeolum externum right upper eyelid: Secondary | ICD-10-CM

## 2019-04-02 NOTE — Progress Notes (Signed)
Virtual Visit via Video Note  I connected with [name] on 04/02/19 at  2:20 PM EDT by a video enabled telemedicine application and verified that I am speaking with the correct person using two identifiers. This visit type was conducted due to national recommendations for restrictions regarding the COVID-19 Pandemic (e.g. social distancing).  This format is felt to be most appropriate for this patient at this time.   I discussed the limitations of evaluation and management by telemedicine and the availability of in person appointments. The patient expressed understanding and agreed to proceed.  Only the patient and myself were on today's video visit. The patient was at home and I was in my office at the time of today's visit.   History of Present Illness:   Patient is a 58 yr old female who presents today with chief complaint of stye. She reports that she first noticed this yesterday. Has previous hx of stye which was treated with erythromycin ointment and she is requesting a refill of this.  Observations/Objective:  Gen: Awake, alert, no acute  Eyes:  Bilateral sclera without injection, small stye noted right upper outer lid Resp: Breathing is even and non-labored Psych: calm/pleasant demeanor Neuro: Alert and Oriented x 3, + facial symmetry, speech is clear.    Assessment and Plan:  Stye- advised pt to begin warm compresses twice daily. Rx sent for erythromycin ointment. She is advised to call if symptoms worsen or if they fail to improve. Pt verbalizes understanding.  Follow Up Instructions:    I discussed the assessment and treatment plan with the patient. The patient was provided an opportunity to ask questions and all were answered. The patient agreed with the plan and demonstrated an understanding of the instructions.   The patient was advised to call back or seek an in-person evaluation if the symptoms worsen or if the condition fails to improve as anticipated.     Nance Pear, NP

## 2019-04-02 NOTE — Telephone Encounter (Signed)
Mail box full but med sent in.

## 2019-04-03 ENCOUNTER — Telehealth: Payer: Self-pay | Admitting: Family

## 2019-04-03 MED ORDER — ERYTHROMYCIN 5 MG/GM OP OINT: 1.0000 "application " | TOPICAL_OINTMENT | Freq: Three times a day (TID) | OPHTHALMIC | 0 refills | Status: DC

## 2019-04-03 NOTE — Telephone Encounter (Signed)
Attempted to reach pt to notify her that rx was sent.  Mailbox was full.

## 2019-04-05 ENCOUNTER — Ambulatory Visit: Payer: BC Managed Care – PPO | Admitting: Psychology

## 2019-04-09 ENCOUNTER — Ambulatory Visit (INDEPENDENT_AMBULATORY_CARE_PROVIDER_SITE_OTHER): Payer: BC Managed Care – PPO | Admitting: Psychology

## 2019-04-09 DIAGNOSIS — F4321 Adjustment disorder with depressed mood: Secondary | ICD-10-CM

## 2019-04-12 ENCOUNTER — Ambulatory Visit: Payer: BC Managed Care – PPO | Admitting: Psychology

## 2019-04-19 ENCOUNTER — Ambulatory Visit (INDEPENDENT_AMBULATORY_CARE_PROVIDER_SITE_OTHER): Payer: BC Managed Care – PPO | Admitting: Psychology

## 2019-04-19 DIAGNOSIS — F4321 Adjustment disorder with depressed mood: Secondary | ICD-10-CM | POA: Diagnosis not present

## 2019-04-26 ENCOUNTER — Ambulatory Visit (INDEPENDENT_AMBULATORY_CARE_PROVIDER_SITE_OTHER): Payer: BC Managed Care – PPO | Admitting: Psychology

## 2019-04-26 DIAGNOSIS — F4321 Adjustment disorder with depressed mood: Secondary | ICD-10-CM

## 2019-04-28 ENCOUNTER — Other Ambulatory Visit: Payer: Self-pay | Admitting: Family Medicine

## 2019-04-28 DIAGNOSIS — F411 Generalized anxiety disorder: Secondary | ICD-10-CM

## 2019-05-05 ENCOUNTER — Ambulatory Visit (INDEPENDENT_AMBULATORY_CARE_PROVIDER_SITE_OTHER): Payer: BC Managed Care – PPO | Admitting: Psychology

## 2019-05-05 DIAGNOSIS — F4321 Adjustment disorder with depressed mood: Secondary | ICD-10-CM | POA: Diagnosis not present

## 2019-05-10 LAB — HM MAMMOGRAPHY

## 2019-05-11 ENCOUNTER — Ambulatory Visit (INDEPENDENT_AMBULATORY_CARE_PROVIDER_SITE_OTHER): Payer: BC Managed Care – PPO | Admitting: Psychology

## 2019-05-11 DIAGNOSIS — F4321 Adjustment disorder with depressed mood: Secondary | ICD-10-CM | POA: Diagnosis not present

## 2019-05-24 ENCOUNTER — Ambulatory Visit (INDEPENDENT_AMBULATORY_CARE_PROVIDER_SITE_OTHER): Payer: BC Managed Care – PPO | Admitting: Psychology

## 2019-05-24 DIAGNOSIS — F4321 Adjustment disorder with depressed mood: Secondary | ICD-10-CM | POA: Diagnosis not present

## 2019-06-03 ENCOUNTER — Ambulatory Visit: Payer: BC Managed Care – PPO | Admitting: Psychology

## 2019-06-09 ENCOUNTER — Ambulatory Visit: Payer: BC Managed Care – PPO | Admitting: Psychology

## 2019-06-10 ENCOUNTER — Other Ambulatory Visit: Payer: Self-pay | Admitting: *Deleted

## 2019-06-10 MED ORDER — ACYCLOVIR 200 MG PO CAPS: 200.0000 mg | ORAL_CAPSULE | Freq: Three times a day (TID) | ORAL | 2 refills | Status: DC

## 2019-06-17 ENCOUNTER — Ambulatory Visit (INDEPENDENT_AMBULATORY_CARE_PROVIDER_SITE_OTHER): Payer: BC Managed Care – PPO | Admitting: Psychology

## 2019-06-17 DIAGNOSIS — F4321 Adjustment disorder with depressed mood: Secondary | ICD-10-CM

## 2019-06-22 ENCOUNTER — Ambulatory Visit (INDEPENDENT_AMBULATORY_CARE_PROVIDER_SITE_OTHER): Payer: BC Managed Care – PPO | Admitting: Psychology

## 2019-06-22 DIAGNOSIS — F4321 Adjustment disorder with depressed mood: Secondary | ICD-10-CM | POA: Diagnosis not present

## 2019-07-08 ENCOUNTER — Ambulatory Visit (INDEPENDENT_AMBULATORY_CARE_PROVIDER_SITE_OTHER): Payer: BC Managed Care – PPO | Admitting: Psychology

## 2019-07-08 DIAGNOSIS — F4321 Adjustment disorder with depressed mood: Secondary | ICD-10-CM

## 2019-07-16 ENCOUNTER — Ambulatory Visit: Payer: BC Managed Care – PPO | Admitting: Psychology

## 2019-07-30 ENCOUNTER — Ambulatory Visit (INDEPENDENT_AMBULATORY_CARE_PROVIDER_SITE_OTHER): Payer: BC Managed Care – PPO | Admitting: Psychology

## 2019-07-30 DIAGNOSIS — F4321 Adjustment disorder with depressed mood: Secondary | ICD-10-CM | POA: Diagnosis not present

## 2019-08-13 ENCOUNTER — Ambulatory Visit: Payer: BC Managed Care – PPO | Admitting: Psychology

## 2019-08-18 ENCOUNTER — Ambulatory Visit (INDEPENDENT_AMBULATORY_CARE_PROVIDER_SITE_OTHER): Payer: BC Managed Care – PPO | Admitting: Psychology

## 2019-08-18 DIAGNOSIS — F4321 Adjustment disorder with depressed mood: Secondary | ICD-10-CM | POA: Diagnosis not present

## 2019-08-25 ENCOUNTER — Other Ambulatory Visit: Payer: Self-pay | Admitting: Family Medicine

## 2019-08-25 DIAGNOSIS — F411 Generalized anxiety disorder: Secondary | ICD-10-CM

## 2019-08-25 MED ORDER — CITALOPRAM HYDROBROMIDE 10 MG PO TABS: ORAL_TABLET | ORAL | 0 refills | Status: DC

## 2019-08-25 NOTE — Telephone Encounter (Signed)
Medication Refill - Medication: citalopram (CELEXA) 10 MG tablet    Preferred Pharmacy (with phone number or street name):  [id number] [place], [place] - [place] [phone] (Phone) [phone] (Fax)

## 2019-09-03 ENCOUNTER — Ambulatory Visit: Payer: BC Managed Care – PPO | Admitting: Psychology

## 2019-09-15 ENCOUNTER — Ambulatory Visit (INDEPENDENT_AMBULATORY_CARE_PROVIDER_SITE_OTHER): Payer: BC Managed Care – PPO | Admitting: Psychology

## 2019-09-15 DIAGNOSIS — F4321 Adjustment disorder with depressed mood: Secondary | ICD-10-CM

## 2019-10-01 ENCOUNTER — Ambulatory Visit (INDEPENDENT_AMBULATORY_CARE_PROVIDER_SITE_OTHER): Payer: BC Managed Care – PPO | Admitting: Psychology

## 2019-10-01 DIAGNOSIS — F4321 Adjustment disorder with depressed mood: Secondary | ICD-10-CM

## 2019-10-15 ENCOUNTER — Ambulatory Visit (INDEPENDENT_AMBULATORY_CARE_PROVIDER_SITE_OTHER): Payer: BC Managed Care – PPO | Admitting: Psychology

## 2019-10-15 DIAGNOSIS — F4321 Adjustment disorder with depressed mood: Secondary | ICD-10-CM | POA: Diagnosis not present

## 2019-11-04 ENCOUNTER — Encounter: Payer: BC Managed Care – PPO | Admitting: Family Medicine

## 2019-11-10 ENCOUNTER — Ambulatory Visit: Payer: BC Managed Care – PPO | Admitting: Psychology

## 2019-11-12 ENCOUNTER — Ambulatory Visit (INDEPENDENT_AMBULATORY_CARE_PROVIDER_SITE_OTHER): Payer: BC Managed Care – PPO | Admitting: Psychology

## 2019-11-12 DIAGNOSIS — F4321 Adjustment disorder with depressed mood: Secondary | ICD-10-CM | POA: Diagnosis not present

## 2019-12-01 ENCOUNTER — Ambulatory Visit (INDEPENDENT_AMBULATORY_CARE_PROVIDER_SITE_OTHER): Payer: BC Managed Care – PPO | Admitting: Psychology

## 2019-12-01 DIAGNOSIS — F4321 Adjustment disorder with depressed mood: Secondary | ICD-10-CM

## 2019-12-16 ENCOUNTER — Encounter: Payer: Self-pay | Admitting: Family Medicine

## 2019-12-16 ENCOUNTER — Other Ambulatory Visit: Payer: Self-pay

## 2019-12-16 ENCOUNTER — Ambulatory Visit (INDEPENDENT_AMBULATORY_CARE_PROVIDER_SITE_OTHER): Payer: BC Managed Care – PPO | Admitting: Family Medicine

## 2019-12-16 DIAGNOSIS — F411 Generalized anxiety disorder: Secondary | ICD-10-CM

## 2019-12-16 DIAGNOSIS — Z0000 Encounter for general adult medical examination without abnormal findings: Secondary | ICD-10-CM | POA: Diagnosis not present

## 2019-12-16 NOTE — Assessment & Plan Note (Signed)
Resolved--- pt has not needed to take xanax and would like to come off the celexa  She is only taking 10 mg so she can stop it

## 2019-12-16 NOTE — Assessment & Plan Note (Signed)
ghm utd Check labs--- will make lab app Will get cologuard/ pap from gyn tdap due--- at next ov Discussed covid vaccine

## 2019-12-16 NOTE — Patient Instructions (Signed)
COVID-19 Vaccine Information can be found at: [email or web address] For questions related to vaccine distribution or appointments, please email [email or web address] or call [phone].    

## 2019-12-16 NOTE — Progress Notes (Signed)
Virtual Visit via Video Note  I connected with Adriana Lopez on 12/16/19 at  3:00 PM EST by a video enabled telemedicine application and verified that I am speaking with the correct person using two identifiers.  Location: Patient: home  Provider: home    I discussed the limitations of evaluation and management by telemedicine and the availability of in person appointments. The patient expressed understanding and agreed to proceed.  History of Present Illness: Pt is home --- with no complaints--- she is doing well with anxiety/ depression and would like to stop celexa   She has not taken xanax or ambien in a long time No other complaints    Observations/Objective: There were no vitals filed for this visit. Pt is in nad Pt with no anxiety/ depression   Assessment and Plan: 1. Preventative health care ghm utd Check labs  D/w pt exercise, healthy diet  D/c celexa / xanax  F/u 1 year or sooner prn  - TSH; Future - Lipid panel; Future - CBC with Differential/Platelet; Future - Comprehensive metabolic panel; Future  2. Generalized anxiety disorder Resolved--- pt will d/c celexa and has already stopped xanax    Follow Up Instructions:    I discussed the assessment and treatment plan with the patient. The patient was provided an opportunity to ask questions and all were answered. The patient agreed with the plan and demonstrated an understanding of the instructions.   The patient was advised to call back or seek an in-person evaluation if the symptoms worsen or if the condition fails to improve as anticipated.  I provided 30 minutes of non-face-to-face time during this encounter.   Ann Held, DO

## 2019-12-17 ENCOUNTER — Ambulatory Visit (INDEPENDENT_AMBULATORY_CARE_PROVIDER_SITE_OTHER): Payer: BC Managed Care – PPO | Admitting: Psychology

## 2019-12-17 DIAGNOSIS — F4321 Adjustment disorder with depressed mood: Secondary | ICD-10-CM | POA: Diagnosis not present

## 2019-12-20 ENCOUNTER — Other Ambulatory Visit: Payer: Self-pay | Admitting: Family Medicine

## 2019-12-20 ENCOUNTER — Telehealth: Payer: Self-pay | Admitting: Family Medicine

## 2019-12-20 DIAGNOSIS — Z9109 Other allergy status, other than to drugs and biological substances: Secondary | ICD-10-CM

## 2019-12-20 MED ORDER — AZELASTINE HCL 0.05 % OP SOLN: 1.0000 [drp] | Freq: Two times a day (BID) | OPHTHALMIC | 12 refills | Status: DC

## 2019-12-20 NOTE — Telephone Encounter (Signed)
Spoke with patient. Lab appointment scheduled. Pt states wanting drops for her eyes. Pt states eyes are very irritated and had to leave work today. Please advise

## 2019-12-20 NOTE — Telephone Encounter (Signed)
Please advice  

## 2019-12-20 NOTE — Telephone Encounter (Signed)
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 Please advise    Best contact number for patient  [phone]

## 2019-12-20 NOTE — Telephone Encounter (Signed)
[employer] sent in

## 2019-12-20 NOTE — Telephone Encounter (Signed)
There is a blood test for allergies but the classroom is what really needs to be tested

## 2019-12-22 ENCOUNTER — Other Ambulatory Visit: Payer: Self-pay

## 2019-12-22 ENCOUNTER — Other Ambulatory Visit (INDEPENDENT_AMBULATORY_CARE_PROVIDER_SITE_OTHER): Payer: BC Managed Care – PPO

## 2019-12-22 DIAGNOSIS — Z0000 Encounter for general adult medical examination without abnormal findings: Secondary | ICD-10-CM | POA: Diagnosis not present

## 2019-12-22 DIAGNOSIS — Z9109 Other allergy status, other than to drugs and biological substances: Secondary | ICD-10-CM

## 2019-12-22 LAB — CBC WITH DIFFERENTIAL/PLATELET
Basophils Absolute: 0 10*3/uL (ref 0.0–0.1)
Basophils Relative: 0.6 % (ref 0.0–3.0)
Eosinophils Absolute: 0.2 10*3/uL (ref 0.0–0.7)
Eosinophils Relative: 2.1 % (ref 0.0–5.0)
HCT: 41.7 % (ref 36.0–46.0)
Hemoglobin: 14.1 g/dL (ref 12.0–15.0)
Lymphocytes Relative: 39.5 % (ref 12.0–46.0)
Lymphs Abs: 3.2 10*3/uL (ref 0.7–4.0)
MCHC: 33.8 g/dL (ref 30.0–36.0)
MCV: 101.2 fl — ABNORMAL HIGH (ref 78.0–100.0)
Monocytes Absolute: 0.5 10*3/uL (ref 0.1–1.0)
Monocytes Relative: 6.8 % (ref 3.0–12.0)
Neutro Abs: 4.1 10*3/uL (ref 1.4–7.7)
Neutrophils Relative %: 51 % (ref 43.0–77.0)
Platelets: 311 10*3/uL (ref 150.0–400.0)
RBC: 4.12 Mil/uL (ref 3.87–5.11)
RDW: 12.4 % (ref 11.5–15.5)
WBC: 8 10*3/uL (ref 4.0–10.5)

## 2019-12-22 LAB — COMPREHENSIVE METABOLIC PANEL
ALT: 12 U/L (ref 0–35)
AST: 20 U/L (ref 0–37)
Albumin: 4.4 g/dL (ref 3.5–5.2)
Alkaline Phosphatase: 55 U/L (ref 39–117)
BUN: 14 mg/dL (ref 6–23)
CO2: 28 mEq/L (ref 19–32)
Calcium: 9.4 mg/dL (ref 8.4–10.5)
Chloride: 100 mEq/L (ref 96–112)
Creatinine, Ser: 0.7 mg/dL (ref 0.40–1.20)
GFR: 85.47 mL/min (ref >60.00)
Glucose, Bld: 90 mg/dL (ref 70–99)
Potassium: 3.8 mEq/L (ref 3.5–5.1)
Sodium: 138 mEq/L (ref 135–145)
Total Bilirubin: 0.9 mg/dL (ref 0.2–1.2)
Total Protein: 6.8 g/dL (ref 6.0–8.3)

## 2019-12-22 LAB — LIPID PANEL
Cholesterol: 194 mg/dL (ref 0–200)
HDL: 97.6 mg/dL (ref >39.00)
LDL Cholesterol: 87 mg/dL (ref 0–99)
NonHDL: 96.41
Total CHOL/HDL Ratio: 2
Triglycerides: 47 mg/dL (ref 0.0–149.0)
VLDL: 9.4 mg/dL (ref 0.0–40.0)

## 2019-12-22 LAB — TSH: TSH: 2.37 u[IU]/mL (ref 0.35–4.50)

## 2019-12-23 LAB — ALLERGY PANEL 11, MOLD GROUP
Allergen, A. alternata, m6: <0.1 kU/L
Allergen, Mucor Racemosus, M4: <0.1 kU/L
Aspergillus fumigatus, m3: <0.1 kU/L
CLADOSPORIUM HERBARUM (M2) IGE: <0.1 kU/L
CLASS: 0
CLASS: 0
Candida Albicans: <0.1 kU/L
Class: 0
Class: 0
Class: 0

## 2019-12-23 LAB — INTERPRETATION:

## 2019-12-31 ENCOUNTER — Ambulatory Visit (INDEPENDENT_AMBULATORY_CARE_PROVIDER_SITE_OTHER): Payer: BC Managed Care – PPO | Admitting: Psychology

## 2019-12-31 DIAGNOSIS — F4321 Adjustment disorder with depressed mood: Secondary | ICD-10-CM | POA: Diagnosis not present

## 2020-01-14 ENCOUNTER — Ambulatory Visit (INDEPENDENT_AMBULATORY_CARE_PROVIDER_SITE_OTHER): Payer: BC Managed Care – PPO | Admitting: Psychology

## 2020-01-14 DIAGNOSIS — F4321 Adjustment disorder with depressed mood: Secondary | ICD-10-CM | POA: Diagnosis not present

## 2020-02-04 ENCOUNTER — Ambulatory Visit (INDEPENDENT_AMBULATORY_CARE_PROVIDER_SITE_OTHER): Payer: BC Managed Care – PPO | Admitting: Psychology

## 2020-02-04 DIAGNOSIS — F4321 Adjustment disorder with depressed mood: Secondary | ICD-10-CM

## 2020-02-18 ENCOUNTER — Other Ambulatory Visit: Payer: Self-pay | Admitting: Family Medicine

## 2020-02-18 DIAGNOSIS — F411 Generalized anxiety disorder: Secondary | ICD-10-CM

## 2020-02-21 ENCOUNTER — Ambulatory Visit (INDEPENDENT_AMBULATORY_CARE_PROVIDER_SITE_OTHER): Payer: BC Managed Care – PPO | Admitting: Psychology

## 2020-02-21 DIAGNOSIS — F4321 Adjustment disorder with depressed mood: Secondary | ICD-10-CM

## 2020-02-25 ENCOUNTER — Other Ambulatory Visit: Payer: Self-pay | Admitting: Family Medicine

## 2020-03-14 ENCOUNTER — Ambulatory Visit (INDEPENDENT_AMBULATORY_CARE_PROVIDER_SITE_OTHER): Payer: BC Managed Care – PPO | Admitting: Psychology

## 2020-03-14 DIAGNOSIS — F4321 Adjustment disorder with depressed mood: Secondary | ICD-10-CM | POA: Diagnosis not present

## 2020-03-31 ENCOUNTER — Ambulatory Visit (INDEPENDENT_AMBULATORY_CARE_PROVIDER_SITE_OTHER): Payer: BC Managed Care – PPO | Admitting: Psychology

## 2020-03-31 DIAGNOSIS — F4321 Adjustment disorder with depressed mood: Secondary | ICD-10-CM | POA: Diagnosis not present

## 2020-04-24 ENCOUNTER — Ambulatory Visit (INDEPENDENT_AMBULATORY_CARE_PROVIDER_SITE_OTHER): Payer: BC Managed Care – PPO | Admitting: Psychology

## 2020-04-24 DIAGNOSIS — F4321 Adjustment disorder with depressed mood: Secondary | ICD-10-CM

## 2020-04-24 IMAGING — DX DG CHEST 2V
2 series · 2 of 2 positions shown · non-contrast
Comparison: 09/10/2018

CLINICAL DATA: Cough, wheezing, shortness of breath

EXAM:
CHEST - 2 VIEW

[chest pa]
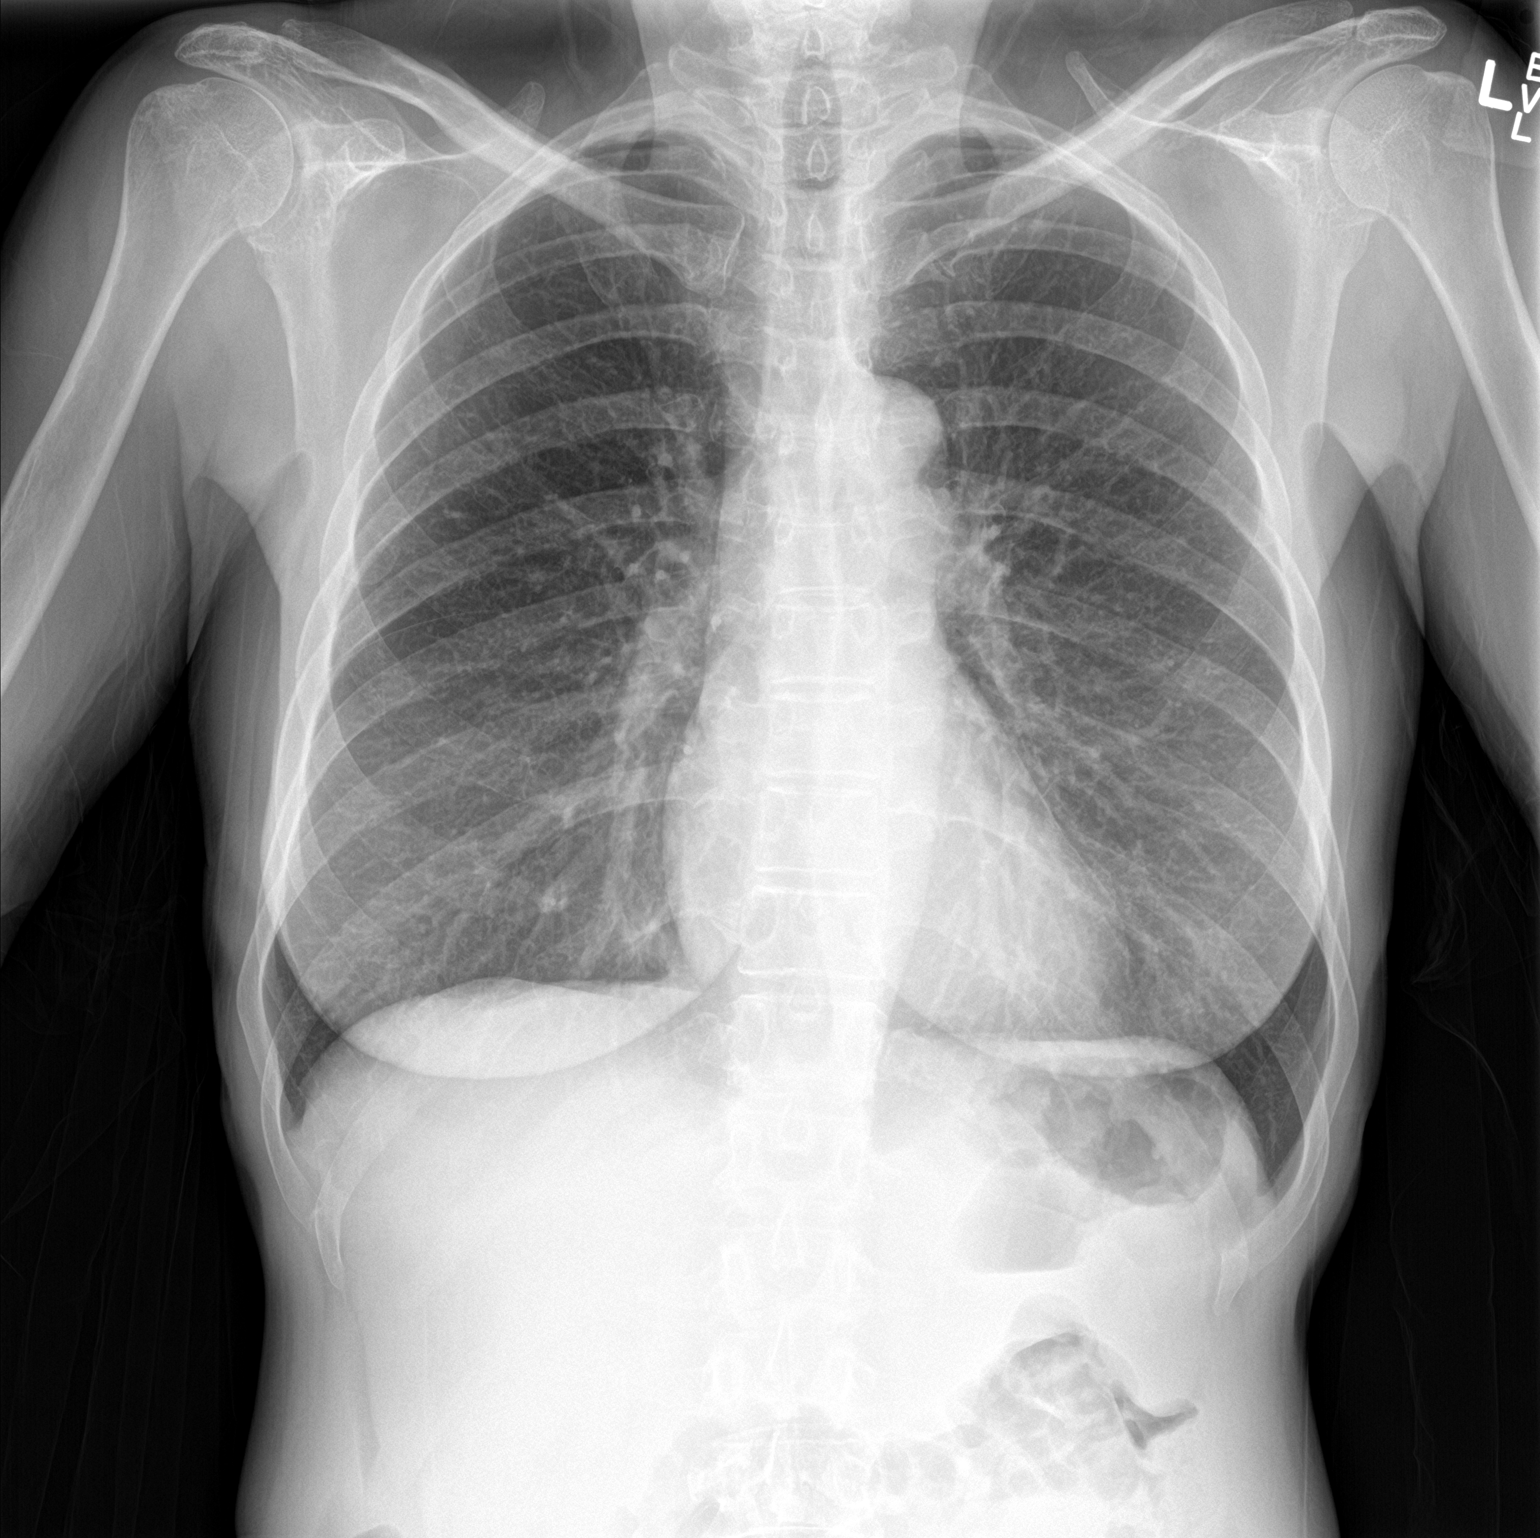

[chest lat]
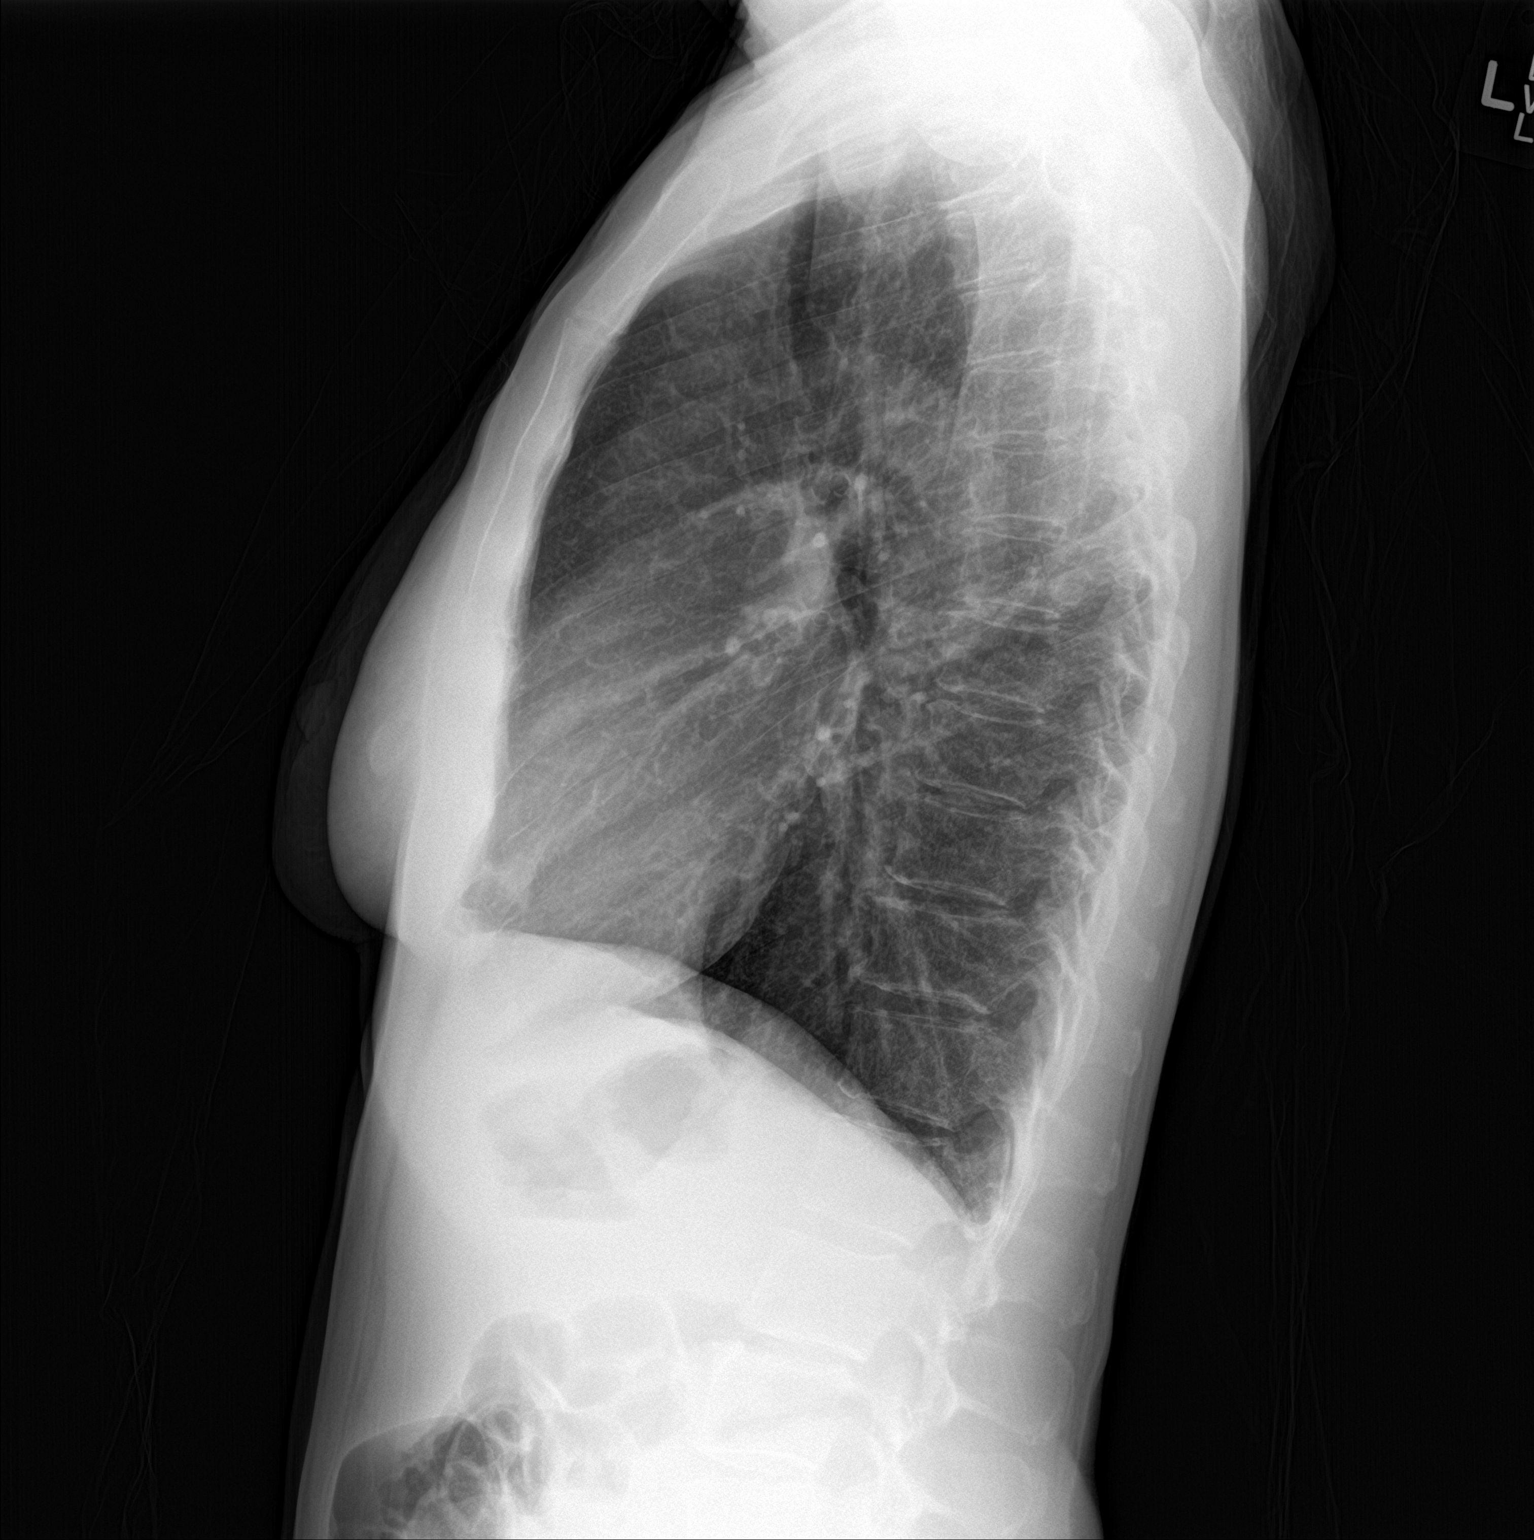

[2 of 2 positions shown; findings below may reference images not displayed]

FINDINGS: Heart and mediastinal contours are within normal limits. No focal
opacities or effusions. No acute bony abnormality.
IMPRESSION: No active cardiopulmonary disease.

## 2020-05-09 ENCOUNTER — Ambulatory Visit (INDEPENDENT_AMBULATORY_CARE_PROVIDER_SITE_OTHER): Payer: BC Managed Care – PPO | Admitting: Psychology

## 2020-05-09 DIAGNOSIS — F4321 Adjustment disorder with depressed mood: Secondary | ICD-10-CM

## 2020-05-29 ENCOUNTER — Ambulatory Visit: Payer: BC Managed Care – PPO | Admitting: Psychology

## 2020-05-31 ENCOUNTER — Ambulatory Visit (INDEPENDENT_AMBULATORY_CARE_PROVIDER_SITE_OTHER): Payer: BC Managed Care – PPO | Admitting: Psychology

## 2020-05-31 DIAGNOSIS — F4321 Adjustment disorder with depressed mood: Secondary | ICD-10-CM | POA: Diagnosis not present

## 2020-06-22 LAB — HM MAMMOGRAPHY

## 2020-06-26 ENCOUNTER — Ambulatory Visit: Payer: BC Managed Care – PPO | Admitting: Psychology

## 2020-07-13 ENCOUNTER — Ambulatory Visit: Payer: BC Managed Care – PPO | Admitting: Family Medicine

## 2020-07-14 ENCOUNTER — Ambulatory Visit (INDEPENDENT_AMBULATORY_CARE_PROVIDER_SITE_OTHER): Payer: BC Managed Care – PPO | Admitting: Psychology

## 2020-07-14 DIAGNOSIS — F4321 Adjustment disorder with depressed mood: Secondary | ICD-10-CM | POA: Diagnosis not present

## 2020-07-24 ENCOUNTER — Other Ambulatory Visit: Payer: Self-pay

## 2020-07-24 ENCOUNTER — Telehealth (INDEPENDENT_AMBULATORY_CARE_PROVIDER_SITE_OTHER): Payer: BC Managed Care – PPO | Admitting: Family Medicine

## 2020-07-24 ENCOUNTER — Encounter: Payer: Self-pay | Admitting: Family Medicine

## 2020-07-24 VITALS — Temp 97.0°F | Ht 64.0 in | Wt 115.0 lb

## 2020-07-24 DIAGNOSIS — R05 Cough: Secondary | ICD-10-CM

## 2020-07-24 DIAGNOSIS — R059 Cough, unspecified: Secondary | ICD-10-CM

## 2020-07-24 MED ORDER — PREDNISONE 10 MG PO TABS: ORAL_TABLET | ORAL | 0 refills | Status: DC

## 2020-07-24 NOTE — Progress Notes (Signed)
Patient ID: Adriana Lopez, female   DOB: Jun 01, 1961, 59 y.o.   MRN: [id number]  This visit type was conducted due to national recommendations for restrictions regarding the COVID-19 pandemic in an effort to limit this patient's exposure and mitigate transmission in our community.   Virtual Visit via Video Note  I connected with Adriana Lopez on 07/24/20 at  1:15 PM EDT by a video enabled telemedicine application and verified that I am speaking with the correct person using two identifiers.  Location patient: home Location provider:work or home office Persons participating in the virtual visit: patient, provider  I discussed the limitations of evaluation and management by telemedicine and the availability of in person appointments. The patient expressed understanding and agreed to proceed.   HPI:  Adriana Lopez is a former smoker who developed onset last Wednesday of some cough, sore throat, body aches.  No fever.  She had some sneezing.  She went Friday and had Covid PCR test which came back negative.  She works in [employer] school.  She has had some wheezing she thinks intermittently since then.  Occasional chills but no documented fever.  No dyspnea.  No loss of taste or smell.  Denies any nausea, vomiting, or diarrhea.  She has smoked previously several years ago.  She has taken Tessalon in the past for similar cough which never seem to work.  She is using NyQuil at night.  She states she has taken prednisone for similar cough which seemed to help.  No history of asthma or any known chronic lung disease.  ROS: See pertinent positives and negatives per HPI.  Past Medical History:  Diagnosis Date  . Allergy    feline  . Anxiety     Past Surgical History:  Procedure Laterality Date  . REFRACTIVE SURGERY  2012   Dr Lucita Ferrara  . SKIN CANCER EXCISION     right ear lobe  . TONSILLECTOMY AND ADENOIDECTOMY      Family History  Problem Relation Age of Onset  . Heart disease Father         pacer  . Cancer Father        dermatologic  . Cancer Maternal Aunt        uterine  . Alzheimer's disease Maternal Aunt   . Alzheimer's disease Mother   . Diabetes Neg Hx   . Stroke Neg Hx   . Hypertension Neg Hx     SOCIAL HX: Former smoker   Current Outpatient Medications:  .  acetaminophen (TYLENOL) 500 MG tablet, Take 500 mg by mouth every 6 (six) hours as needed., Disp: , Rfl:  .  acyclovir (ZOVIRAX) 200 MG capsule, TAKE 1 CAPSULE (200 MG TOTAL) BY MOUTH 3 (THREE) TIMES DAILY., Disp: 30 capsule, Rfl: 2 .  estradiol (VIVELLE-DOT) 0.05 MG/24HR patch, APPLY 1 PATCH TWO TIMES A WEEK, Disp: , Rfl: 0 .  Nutritional Supplements (HRT SUPPORT PO), Take by mouth., Disp: , Rfl:  .  progesterone (PROMETRIUM) 100 MG capsule, Take 100 mg by mouth at bedtime., Disp: , Rfl:  .  azelastine (OPTIVAR) 0.05 % ophthalmic solution, Place 1 drop into both eyes 2 (two) times daily. (Patient not taking: Reported on 07/24/2020), Disp: 6 mL, Rfl: 12 .  cetirizine (ZYRTEC) 10 MG tablet, Take 10 mg by mouth daily. (Patient not taking: Reported on 07/24/2020), Disp: , Rfl:  .  erythromycin ophthalmic ointment, Place 1 application into the right eye 3 (three) times daily. (Patient not taking: Reported on 07/24/2020), Disp: 3.5  g, Rfl: 0 .  fluticasone (FLONASE) 50 MCG/ACT nasal spray, Place 2 sprays into both nostrils daily. (Patient not taking: Reported on 07/24/2020), Disp: 16 g, Rfl: 6 .  progesterone (PROMETRIUM) 100 MG capsule, , Disp: , Rfl:   EXAM:  VITALS per patient if applicable:  GENERAL: alert, oriented, appears well and in no acute distress  HEENT: atraumatic, conjunttiva clear, no obvious abnormalities on inspection of external nose and ears  NECK: normal movements of the head and neck  LUNGS: on inspection no signs of respiratory distress, breathing rate appears normal, no obvious gross SOB, gasping or wheezing  CV: no obvious cyanosis  MS: moves all visible extremities without noticeable  abnormality  PSYCH/NEURO: pleasant and cooperative, no obvious depression or anxiety, speech and thought processing grossly intact  ASSESSMENT AND PLAN:  Discussed the following assessment and plan:  Cough and upper respiratory symptoms.  Recent Covid test negative.  Patient is afebrile and in no respiratory distress.  Possible reactive airway component.  -We agreed to calling in prednisone taper. -Follow-up promptly for any fever or increased shortness of breath -We will produce work note to keep her out of work through this Thursday.     I discussed the assessment and treatment plan with the patient. The patient was provided an opportunity to ask questions and all were answered. The patient agreed with the plan and demonstrated an understanding of the instructions.   The patient was advised to call back or seek an in-person evaluation if the symptoms worsen or if the condition fails to improve as anticipated.     Carolann Littler, MD

## 2020-07-25 ENCOUNTER — Encounter: Payer: Self-pay | Admitting: Family Medicine

## 2020-07-26 ENCOUNTER — Other Ambulatory Visit: Payer: Self-pay | Admitting: Family Medicine

## 2020-07-26 ENCOUNTER — Telehealth: Payer: Self-pay | Admitting: Family Medicine

## 2020-07-26 DIAGNOSIS — J40 Bronchitis, not specified as acute or chronic: Secondary | ICD-10-CM

## 2020-07-26 MED ORDER — AZITHROMYCIN 250 MG PO TABS: ORAL_TABLET | ORAL | 0 refills | Status: DC

## 2020-07-26 NOTE — Telephone Encounter (Signed)
Z-pack sent to pharmacy

## 2020-07-26 NOTE — Telephone Encounter (Signed)
Pt made aware

## 2020-07-26 NOTE — Telephone Encounter (Signed)
Caller: [facility]  Call Back # [phone]  Patient states virtual visit with another [place] provider , patient states was wheezing at appointment . Patient states still not feeling any better. Patient states no antibiotic given only prednisone.  Please advise

## 2020-07-26 NOTE — Telephone Encounter (Signed)
Pt had virtual on 07/24/20 and still having sxs and was given Prednisone.  COVID negative. Pt wants to know if she should have a antibiotic? Please advise

## 2020-07-27 ENCOUNTER — Ambulatory Visit (INDEPENDENT_AMBULATORY_CARE_PROVIDER_SITE_OTHER): Payer: BC Managed Care – PPO | Admitting: Psychology

## 2020-07-27 DIAGNOSIS — F4321 Adjustment disorder with depressed mood: Secondary | ICD-10-CM | POA: Diagnosis not present

## 2020-08-21 ENCOUNTER — Ambulatory Visit (INDEPENDENT_AMBULATORY_CARE_PROVIDER_SITE_OTHER): Payer: BC Managed Care – PPO | Admitting: Psychology

## 2020-08-21 DIAGNOSIS — F4321 Adjustment disorder with depressed mood: Secondary | ICD-10-CM | POA: Diagnosis not present

## 2020-09-14 ENCOUNTER — Ambulatory Visit (INDEPENDENT_AMBULATORY_CARE_PROVIDER_SITE_OTHER): Payer: BC Managed Care – PPO | Admitting: Psychology

## 2020-09-14 DIAGNOSIS — F4321 Adjustment disorder with depressed mood: Secondary | ICD-10-CM | POA: Diagnosis not present

## 2020-10-12 ENCOUNTER — Ambulatory Visit (INDEPENDENT_AMBULATORY_CARE_PROVIDER_SITE_OTHER): Payer: BC Managed Care – PPO | Admitting: Psychology

## 2020-10-12 DIAGNOSIS — F4321 Adjustment disorder with depressed mood: Secondary | ICD-10-CM

## 2020-11-09 ENCOUNTER — Ambulatory Visit (INDEPENDENT_AMBULATORY_CARE_PROVIDER_SITE_OTHER): Payer: BC Managed Care – PPO | Admitting: Psychology

## 2020-11-09 DIAGNOSIS — F4321 Adjustment disorder with depressed mood: Secondary | ICD-10-CM

## 2020-11-14 ENCOUNTER — Encounter: Payer: Self-pay | Admitting: Family Medicine

## 2020-11-15 ENCOUNTER — Encounter: Payer: Self-pay | Admitting: Family Medicine

## 2020-11-15 ENCOUNTER — Telehealth (INDEPENDENT_AMBULATORY_CARE_PROVIDER_SITE_OTHER): Payer: BC Managed Care – PPO | Admitting: Family Medicine

## 2020-11-15 DIAGNOSIS — J40 Bronchitis, not specified as acute or chronic: Secondary | ICD-10-CM | POA: Diagnosis not present

## 2020-11-15 MED ORDER — AZITHROMYCIN 250 MG PO TABS: ORAL_TABLET | ORAL | 0 refills | Status: DC

## 2020-11-15 MED ORDER — PROMETHAZINE-DM 6.25-15 MG/5ML PO SYRP: 5.0000 mL | ORAL_SOLUTION | Freq: Four times a day (QID) | ORAL | 0 refills | Status: DC | PRN

## 2020-11-15 NOTE — Progress Notes (Signed)
Virtual Visit via Video Note  I connected with Adriana Lopez on 11/15/20 at 11:20 AM EST by a video enabled telemedicine application and verified that I am speaking with the correct person using two identifiers.  Location: Patient: home alone-- we had to change to telephone call due to pt having issues with her phone  Provider: home    I discussed the limitations of evaluation and management by telemedicine and the availability of in person appointments. The patient expressed understanding and agreed to proceed.  History of Present Illness: Pt has been exposed to covid and it is spreading around school Pt has coughing , no fever but she is taking advil  Symptoms since last week  She is taking nyquil and mucinex     Observations/Objective: There were no vitals filed for this visit. No fever  Pt in nad  Assessment and Plan: 1. Bronchitis Pt waiting for covid test z pak and cough med  - azithromycin (ZITHROMAX Z-PAK) 250 MG tablet; As directed  Dispense: 6 each; Refill: 0 - promethazine-dextromethorphan (PROMETHAZINE-DM) 6.25-15 MG/5ML syrup; Take 5 mLs by mouth 4 (four) times daily as needed.  Dispense: 118 mL; Refill: 0   Follow Up Instructions:    I discussed the assessment and treatment plan with the patient. The patient was provided an opportunity to ask questions and all were answered. The patient agreed with the plan and demonstrated an understanding of the instructions.   The patient was advised to call back or seek an in-person evaluation if the symptoms worsen or if the condition fails to improve as anticipated.  I provided 25 minutes of non-face-to-face time during this encounter.   Ann Held, DO

## 2020-12-07 ENCOUNTER — Ambulatory Visit (INDEPENDENT_AMBULATORY_CARE_PROVIDER_SITE_OTHER): Payer: BC Managed Care – PPO | Admitting: Psychology

## 2020-12-07 DIAGNOSIS — F4321 Adjustment disorder with depressed mood: Secondary | ICD-10-CM

## 2021-01-04 ENCOUNTER — Ambulatory Visit (INDEPENDENT_AMBULATORY_CARE_PROVIDER_SITE_OTHER): Payer: BC Managed Care – PPO | Admitting: Psychology

## 2021-01-04 DIAGNOSIS — F4321 Adjustment disorder with depressed mood: Secondary | ICD-10-CM | POA: Diagnosis not present

## 2021-01-24 ENCOUNTER — Encounter: Payer: Self-pay | Admitting: Family Medicine

## 2021-01-25 ENCOUNTER — Ambulatory Visit: Payer: BC Managed Care – PPO | Admitting: Psychology

## 2021-01-29 ENCOUNTER — Other Ambulatory Visit: Payer: Self-pay

## 2021-01-29 ENCOUNTER — Ambulatory Visit: Payer: BC Managed Care – PPO | Admitting: Family Medicine

## 2021-01-29 ENCOUNTER — Other Ambulatory Visit (HOSPITAL_BASED_OUTPATIENT_CLINIC_OR_DEPARTMENT_OTHER): Payer: Self-pay

## 2021-01-29 ENCOUNTER — Encounter: Payer: Self-pay | Admitting: Family Medicine

## 2021-01-29 VITALS — BP 100/78 | HR 64 | Temp 98.8°F | Resp 18 | Ht 64.0 in | Wt 116.6 lb

## 2021-01-29 DIAGNOSIS — S161XXA Strain of muscle, fascia and tendon at neck level, initial encounter: Secondary | ICD-10-CM | POA: Diagnosis not present

## 2021-01-29 DIAGNOSIS — M25512 Pain in left shoulder: Secondary | ICD-10-CM | POA: Insufficient documentation

## 2021-01-29 MED ORDER — CYCLOBENZAPRINE HCL 10 MG PO TABS
10.0000 mg | ORAL_TABLET | Freq: Three times a day (TID) | ORAL | 0 refills | Status: DC | PRN
  Filled 2021-01-29: qty 30, 10d supply, fill #0

## 2021-01-29 NOTE — Patient Instructions (Signed)
Cervical Sprain A cervical sprain is a stretch or tear in one or more of the ligaments in the neck. Ligaments are the tissues that connect bones. Cervical sprains can range from mild to severe. Severe cervical sprains can cause the spinal bones (vertebrae) in the neck to be unstable. This can result in spinal cord damage and in serious nervous system problems. The time that it takes for a cervical sprain to heal depends on the cause and extent of the injury. Most cervical sprains heal in 4-6 weeks. What are the causes? Cervical sprains may be caused by trauma, such as an injury from a motor vehicle accident, a fall, or a sudden forward and backward whipping movement of the head and neck (whiplash injury). Mild cervical sprains may be caused by wear and tear over time. What increases the risk? The following factors may make you more likely to develop this condition:  Participating in activities that have a high risk of trauma to the neck. These include contact sports, auto racing, gymnastics, and diving.  Taking risks when driving or riding in a motor vehicle.  Osteoarthritis of the spine.  Poor strength and flexibility of the neck.  A previous neck injury.  Poor posture.  Spending long periods in certain positions that put stress on the neck, such as sitting at a computer for a long time. What are the signs or symptoms? Symptoms of this condition include:  Pain, soreness, stiffness, tenderness, swelling, or a burning sensation in the front, back, or sides of the neck, shoulders, or upper back.  Sudden tightening of neck muscles (spasms).  Limited ability to move the neck.  Headache.  Dizziness.  Nausea or vomiting.  Weakness, numbness, or tingling in a hand or an arm. Symptoms may develop right away after injury, or they may develop over a few days. In some cases, symptoms may go away with treatment and return (recur) over time. How is this diagnosed? This condition may be  diagnosed based on:  Your medical history.  Your symptoms.  Any recent injuries or known neck problems that you have, such as arthritis in the neck.  A physical exam.  Imaging tests, such as X-rays, MRI, and CT scan. How is this treated? This condition is treated by resting and icing the injured area and doing physical therapy exercises. Heat therapy may be used 2-3 days after the injury occurred if there is no swelling. Depending on the severity of your condition, treatment may also include:  Keeping your neck in place (immobilized) for periods of time. This may be done using: ? A cervical collar. This supports your chin and the back of your head. ? A cervical traction device. This is a sling that holds up your head. The device removes weight and pressure from your neck, and it may help to relieve pain.  Medicines that help to relieve pain and inflammation.  Medicines that help to relax your muscles (muscle relaxants).  Surgery. This is rare. Follow these instructions at home: Medicines  Take over-the-counter and prescription medicines only as told by your health care provider.  Ask your health care provider if the medicine prescribed to you: ? Requires you to avoid driving or using heavy machinery. ? Can cause constipation. You may need to take these actions to prevent or treat constipation:  Drink enough fluid to keep your urine pale yellow.  Take over-the-counter or prescription medicines.  Eat foods that are high in fiber, such as beans, whole grains, and fresh fruits   and vegetables.  Limit foods that are high in fat and processed sugars, such as fried or sweet foods.   If you have a cervical collar:  Wear the collar as told by your health care provider. Do not remove it unless told.  Ask before making any adjustments to your collar.  If you have long hair, keep it outside of the collar.  Ask your health care provider if you may remove the collar for cleaning and  bathing. If so: ? Follow instructions about how to remove it safely. ? Clean it by hand with mild soap and water and air-dry it completely. ? If your collar has removable pads, remove them every 1-2 days and wash them by hand with soap and water. Let them air-dry completely before putting them back in the collar.  Tell your health care provider if your skin under the collar has irritation or sores. Managing pain, stiffness, and swelling  If directed, use a cervical traction device as told.  If directed, put ice on the affected area. To do this: ? Put ice in a plastic bag. ? Place a towel between your skin and the bag. ? Leave the ice on for 20 minutes, 2-3 times a day.  If directed, apply heat to the affected area before you do your physical therapy or as often as told by your health care provider. Use the heat source that your health care provider recommends, such as a moist heat pack or a heating pad. ? Place a towel between your skin and the heat source. ? Leave the heat on for 20-30 minutes. ? Remove the heat if your skin turns bright red. This is especially important if you are unable to feel pain, heat, or cold. You may have a greater risk of getting burned.      Activity  Do not drive while wearing a cervical collar. If you do not have a cervical collar, ask if it is safe to drive while your neck heals.  Do not lift anything that is heavier than 10 lb (4.5 kg), or the limit that you are told, until your health care provider says that it is safe.  Rest as told by your health care provider.  If physical therapy was prescribed, do exercises as told by your health care provider or physical therapist.  Return to your normal activities as told by your health care provider. Avoid positions and activities that make your symptoms worse. Ask your health care provider what activities are safe for you. General instructions  Do not use any products that contain nicotine or tobacco, such  as cigarettes, e-cigarettes, and chewing tobacco. These can delay healing. If you need help quitting, ask your health care provider.  Keep all follow-up visits as told by your health care provider or physical therapist. This is important. How is this prevented? To prevent a cervical sprain from happening again:  Use and maintain good posture. Make any needed adjustments to your workstation to help you do this.  Exercise regularly as told by your health care provider or physical therapist.  Avoid risky activities that may cause a cervical sprain. Contact a health care provider if you have:  Symptoms that get worse or do not get better after 2 weeks of treatment.  Pain that gets worse or does not get better with medicine.  New, unexplained symptoms.  Sores or irritated skin on your neck from wearing your cervical collar. Get help right away if:  You have severe pain.    You develop numbness, tingling, or weakness in any part of your body.  You cannot move a part of your body (you have paralysis).  You have neck pain along with severe dizziness or headache. Summary  A cervical sprain is a stretch or tear in one or more of the ligaments in the neck.  Cervical sprains may be caused by trauma, such as an injury from a motor vehicle accident, a fall, or a sudden forward and backward whipping movement of the head and neck (whiplash injury).  Symptoms may develop right away after injury, or they may develop over a few days.  This condition may be treated with rest, ice, heat, medicines, physical therapy, and surgery. This information is not intended to replace advice given to you by your health care provider. Make sure you discuss any questions you have with your health care provider. Document Revised: 06/23/2019 Document Reviewed: 06/23/2019 Elsevier Patient Education  2021 Elsevier Inc.  

## 2021-01-29 NOTE — Assessment & Plan Note (Signed)
Muscle relaxer  Ice / heat Refer to pt and cont massage  rto prn

## 2021-01-29 NOTE — Progress Notes (Signed)
Patient ID: Adriana Lopez, female    DOB: 05-19-1961  Age: 60 y.o. MRN: [id number]    Subjective:  Subjective  HPI Adriana Lopez presents for an office visit today. She complains of left shoulder and left upper back pain. She states that the it maybe the result of COVID and her job. 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 She endorses that massage, heat, and Advil helps to relieve the pain. She also complains of frequent HA secondary to her back pain and shoulder. She also complains of sleep disturbance resulted from the pain. She denies any chest pain, SOB, fever, abdominal pain, cough, chills, sore throat, dysuria, urinary incontinence,or N/V/D at this time.  Review of Systems  Constitutional: Negative for chills, fatigue and fever.  HENT: Negative for ear pain, sinus pressure and sinus pain.   Eyes: Negative for pain.  Respiratory: Negative for cough and shortness of breath.   Cardiovascular: Negative for chest pain, palpitations and leg swelling.  Gastrointestinal: Negative for abdominal pain, blood in stool, constipation, diarrhea, nausea and vomiting.  Genitourinary: Negative for dysuria, frequency, hematuria and urgency.  Musculoskeletal: Positive for back pain (Left upper).       (+) left shoulder pain   Neurological: Positive for headaches (Secondary to left shoulder and left upper back pain).  Psychiatric/Behavioral: Positive for sleep disturbance (Secondary to the left shoulder pain and left upper back pain ).    History Past Medical History:  Diagnosis Date  . Allergy    feline  . Anxiety     She has a past surgical history that includes Tonsillectomy and adenoidectomy; Refractive surgery (2012); and Skin cancer excision.   Her family history includes Alzheimer's disease in her maternal aunt and mother; Cancer in her father and maternal aunt; Heart disease in her father.She reports that she has quit smoking. Her  smoking use included cigarettes. She has never used smokeless tobacco. She reports current alcohol use of about 7.0 standard drinks of alcohol per week. She reports that she does not use drugs.  Current Outpatient Medications on File Prior to Visit  Medication Sig Dispense Refill  . acetaminophen (TYLENOL) 500 MG tablet Take 500 mg by mouth every 6 (six) hours as needed.    Marland Kitchen acyclovir (ZOVIRAX) 200 MG capsule TAKE 1 CAPSULE (200 MG TOTAL) BY MOUTH 3 (THREE) TIMES DAILY. 30 capsule 2  . azelastine (OPTIVAR) 0.05 % ophthalmic solution Place 1 drop into both eyes 2 (two) times daily. 6 mL 12  . cetirizine (ZYRTEC) 10 MG tablet Take 10 mg by mouth daily.    . Nutritional Supplements (HRT SUPPORT PO) Take by mouth.    . progesterone (PROMETRIUM) 100 MG capsule     . progesterone (PROMETRIUM) 100 MG capsule Take 100 mg by mouth at bedtime.    Marland Kitchen azithromycin (ZITHROMAX Z-PAK) 250 MG tablet As directed (Patient not taking: Reported on 01/29/2021) 6 each 0  . erythromycin ophthalmic ointment Place 1 application into the right eye 3 (three) times daily. (Patient not taking: Reported on 01/29/2021) 3.5 g 0  . predniSONE (DELTASONE) 10 MG tablet Taper as follows: 4-4-3-3-2-2-1-1 (Patient not taking: Reported on 01/29/2021) 20 tablet 0  . promethazine-dextromethorphan (PROMETHAZINE-DM) 6.25-15 MG/5ML syrup Take 5 mLs by mouth 4 (four) times daily as needed. (Patient not taking: Reported on 01/29/2021) 118 mL 0   No current facility-administered medications on file prior to visit.     Objective:  Objective  Physical Exam Vitals and nursing note reviewed.  Constitutional:      General: She is not in acute distress.    Appearance: Normal appearance. She is well-developed. She is not ill-appearing.  HENT:     Head: Normocephalic and atraumatic.     Right Ear: External ear normal.     Left Ear: External ear normal.     Nose: Nose normal.  Eyes:     Extraocular Movements: Extraocular movements intact.      Pupils: Pupils are equal, round, and reactive to light.  Cardiovascular:     Rate and Rhythm: Normal rate and regular rhythm.     Pulses: Normal pulses.     Heart sounds: Normal heart sounds. No murmur heard. No friction rub. No gallop.   Pulmonary:     Effort: Pulmonary effort is normal. No respiratory distress.     Breath sounds: Normal breath sounds. No wheezing, rhonchi or rales.  Abdominal:     General: Bowel sounds are normal. There is no distension.     Palpations: Abdomen is soft.     Tenderness: There is no abdominal tenderness. There is no guarding or rebound.     Hernia: No hernia is present.  Musculoskeletal:        General: Normal range of motion.     Cervical back: Normal range of motion and neck supple.     Comments: There is muscle spasm present in her left deltoid muscle and left trapezius muscle.   Skin:    General: Skin is warm and dry.  Neurological:     Mental Status: She is alert and oriented to person, place, and time.  Psychiatric:        Behavior: Behavior normal.        Thought Content: Thought content normal.    BP 100/78 (BP Location: Right Arm, Patient Position: Sitting, Cuff Size: Normal)   Pulse 64   Temp 98.8 F (37.1 C) (Oral)   Resp 18   Ht 5\' 4"  (1.626 m)   Wt 116 lb 9.6 oz (52.9 kg)   SpO2 98%   BMI 20.01 kg/m  Wt Readings from Last 3 Encounters:  01/29/21 116 lb 9.6 oz (52.9 kg)  07/24/20 115 lb (52.2 kg)  12/22/18 114 lb (51.7 kg)     Lab Results  Component Value Date   WBC 8.0 12/22/2019   HGB 14.1 12/22/2019   HCT 41.7 12/22/2019   PLT 311.0 12/22/2019   GLUCOSE 90 12/22/2019   CHOL 194 12/22/2019   TRIG 47.0 12/22/2019   HDL 97.60 12/22/2019   LDLCALC 87 12/22/2019   ALT 12 12/22/2019   AST 20 12/22/2019   NA 138 12/22/2019   K 3.8 12/22/2019   CL 100 12/22/2019   CREATININE 0.70 12/22/2019   BUN 14 12/22/2019   CO2 28 12/22/2019   TSH 2.37 12/22/2019    DG Chest 2 View  Result Date: 12/22/2018 CLINICAL DATA:   Cough, wheezing, shortness of breath EXAM: CHEST - 2 VIEW COMPARISON:  09/10/2018 FINDINGS: Heart and mediastinal contours are within normal limits. No focal opacities or effusions. No acute bony abnormality. IMPRESSION: No active cardiopulmonary disease. Electronically Signed   By: Rolm Baptise M.D.   On: 12/22/2018 17:49     Assessment & Plan:  Plan    Meds ordered this encounter  Medications  . cyclobenzaprine (FLEXERIL) 10 MG tablet    Sig: Take 1 tablet (10 mg total) by mouth 3 (three) times daily as needed  for muscle spasms.    Dispense:  30 tablet    Refill:  0    Problem List Items Addressed This Visit   None   Visit Diagnoses    Acute pain of left shoulder    -  Primary   Relevant Medications   cyclobenzaprine (FLEXERIL) 10 MG tablet   Other Relevant Orders   Ambulatory referral to Physical Therapy   Strain of neck muscle, initial encounter       Relevant Orders   Ambulatory referral to Physical Therapy      Follow-up: Return if symptoms worsen or fail to improve.   I,Gordon Zheng,acting as a Education administrator for [employer], DO.,have documented all relevant documentation on the behalf of Ann Held, DO,as directed by  Ann Held, DO while in the presence of [place], DO, have reviewed all documentation for this visit. The documentation on 01/29/21 for the exam, diagnosis, procedures, and orders are all accurate and complete.  Ann Held, DO

## 2021-01-29 NOTE — Assessment & Plan Note (Signed)
Muscle relaxer  Aleve  PT and massage

## 2021-02-15 ENCOUNTER — Ambulatory Visit: Payer: BC Managed Care – PPO | Admitting: Psychology

## 2021-02-24 ENCOUNTER — Encounter: Payer: Self-pay | Admitting: Family Medicine

## 2021-02-26 ENCOUNTER — Other Ambulatory Visit: Payer: Self-pay | Admitting: Family Medicine

## 2021-02-26 DIAGNOSIS — M25512 Pain in left shoulder: Secondary | ICD-10-CM

## 2021-02-26 DIAGNOSIS — M542 Cervicalgia: Secondary | ICD-10-CM

## 2021-02-26 MED ORDER — PREDNISONE 10 MG PO TABS: ORAL_TABLET | ORAL | 0 refills | Status: DC

## 2021-02-26 MED ORDER — TRAMADOL HCL 50 MG PO TABS: 50.0000 mg | ORAL_TABLET | Freq: Three times a day (TID) | ORAL | 0 refills | Status: AC | PRN

## 2021-02-26 NOTE — Telephone Encounter (Signed)
We could try a pred taper and temporary tramadol We should probably xray neck and shoulder as well

## 2021-02-26 NOTE — Telephone Encounter (Signed)
Pt seen on 01/29/21 for pain and was given Flexeril. Pt states medication is not helping with pain. Please advise

## 2021-03-22 ENCOUNTER — Ambulatory Visit (INDEPENDENT_AMBULATORY_CARE_PROVIDER_SITE_OTHER): Payer: BC Managed Care – PPO | Admitting: Psychology

## 2021-03-22 DIAGNOSIS — F4321 Adjustment disorder with depressed mood: Secondary | ICD-10-CM

## 2021-04-06 ENCOUNTER — Ambulatory Visit: Payer: BC Managed Care – PPO | Admitting: Psychology

## 2021-04-16 ENCOUNTER — Ambulatory Visit (HOSPITAL_BASED_OUTPATIENT_CLINIC_OR_DEPARTMENT_OTHER)
Admission: RE | Admit: 2021-04-16 | Discharge: 2021-04-16 | Disposition: A | Discharge status: Home / Self Care | Payer: BC Managed Care – PPO | Source: Ambulatory Visit | Attending: Family Medicine | Admitting: Family Medicine

## 2021-04-16 ENCOUNTER — Other Ambulatory Visit: Payer: Self-pay

## 2021-04-16 DIAGNOSIS — M25512 Pain in left shoulder: Secondary | ICD-10-CM | POA: Diagnosis present

## 2021-04-16 DIAGNOSIS — M542 Cervicalgia: Secondary | ICD-10-CM | POA: Diagnosis present

## 2021-04-26 ENCOUNTER — Encounter: Payer: Self-pay | Admitting: Family Medicine

## 2021-04-27 ENCOUNTER — Other Ambulatory Visit: Payer: Self-pay

## 2021-04-27 DIAGNOSIS — R197 Diarrhea, unspecified: Secondary | ICD-10-CM

## 2021-04-28 ENCOUNTER — Encounter: Payer: Self-pay | Admitting: Family Medicine

## 2021-05-01 ENCOUNTER — Other Ambulatory Visit: Payer: Self-pay

## 2021-05-01 ENCOUNTER — Other Ambulatory Visit (INDEPENDENT_AMBULATORY_CARE_PROVIDER_SITE_OTHER): Payer: BC Managed Care – PPO

## 2021-05-01 ENCOUNTER — Other Ambulatory Visit: Payer: Self-pay | Admitting: Family Medicine

## 2021-05-01 ENCOUNTER — Telehealth: Payer: Self-pay | Admitting: Family Medicine

## 2021-05-01 DIAGNOSIS — N760 Acute vaginitis: Secondary | ICD-10-CM

## 2021-05-01 DIAGNOSIS — R197 Diarrhea, unspecified: Secondary | ICD-10-CM

## 2021-05-01 LAB — COMPREHENSIVE METABOLIC PANEL
ALT: 9 U/L (ref 0–35)
AST: 16 U/L (ref 0–37)
Albumin: 4.2 g/dL (ref 3.5–5.2)
Alkaline Phosphatase: 52 U/L (ref 39–117)
BUN: 10 mg/dL (ref 6–23)
CO2: 30 mEq/L (ref 19–32)
Calcium: 9.3 mg/dL (ref 8.4–10.5)
Chloride: 103 mEq/L (ref 96–112)
Creatinine, Ser: 0.66 mg/dL (ref 0.40–1.20)
GFR: 94.97 mL/min (ref >60.00)
Glucose, Bld: 93 mg/dL (ref 70–99)
Potassium: 4.5 mEq/L (ref 3.5–5.1)
Sodium: 140 mEq/L (ref 135–145)
Total Bilirubin: 0.6 mg/dL (ref 0.2–1.2)
Total Protein: 6.4 g/dL (ref 6.0–8.3)

## 2021-05-01 LAB — CBC WITH DIFFERENTIAL/PLATELET
Basophils Absolute: 0.1 10*3/uL (ref 0.0–0.1)
Basophils Relative: 0.7 % (ref 0.0–3.0)
Eosinophils Absolute: 0.2 10*3/uL (ref 0.0–0.7)
Eosinophils Relative: 2.1 % (ref 0.0–5.0)
HCT: 37.2 % (ref 36.0–46.0)
Hemoglobin: 12.8 g/dL (ref 12.0–15.0)
Lymphocytes Relative: 34.5 % (ref 12.0–46.0)
Lymphs Abs: 2.8 10*3/uL (ref 0.7–4.0)
MCHC: 34.4 g/dL (ref 30.0–36.0)
MCV: 99 fl (ref 78.0–100.0)
Monocytes Absolute: 0.7 10*3/uL (ref 0.1–1.0)
Monocytes Relative: 8.2 % (ref 3.0–12.0)
Neutro Abs: 4.4 10*3/uL (ref 1.4–7.7)
Neutrophils Relative %: 54.5 % (ref 43.0–77.0)
Platelets: 356 10*3/uL (ref 150.0–400.0)
RBC: 3.76 Mil/uL — ABNORMAL LOW (ref 3.87–5.11)
RDW: 12.6 % (ref 11.5–15.5)
WBC: 8.1 10*3/uL (ref 4.0–10.5)

## 2021-05-01 MED ORDER — FLUCONAZOLE 150 MG PO TABS: ORAL_TABLET | ORAL | 0 refills | Status: DC

## 2021-05-01 MED ORDER — FLUCONAZOLE 150 MG PO TABS
ORAL_TABLET | ORAL | 0 refills | Status: DC
Start: 2021-05-01 — End: 2021-05-01

## 2021-05-01 NOTE — Telephone Encounter (Signed)
Pt was seen 04/30/21 at [facility]. She was prescribed Flagyl but has not picked it up yeast- she wanted your opinion on it. Do you still want to order Cdiff on her? She has been scheduled for the blood worked- cbc, cmp. She does think the [employer], probiotic, and 3 Imodium has helped with the Diarrhea.   Pt also says she does not feel like the Diflucan has helped much.

## 2021-05-01 NOTE — Telephone Encounter (Signed)
Should she take the Flagyl?

## 2021-05-01 NOTE — Addendum Note (Signed)
Addended by: Manuela Schwartz on: 05/01/2021 11:26 AM   Modules accepted: Orders

## 2021-05-01 NOTE — Telephone Encounter (Signed)
Patient states she is in need of a refill for the yeast infection med. She states that it's still not all cleared up yet. Please advise

## 2021-05-01 NOTE — Telephone Encounter (Signed)
Resent the Diflucan because it was set to print.

## 2021-05-01 NOTE — Telephone Encounter (Signed)
Patient Name: Adriana Lopez [facility] Gender: Female DOB: 22-Feb-1961 Client [facility] Night - Client Client Site [facility] - Night Physician Roma Schanz- MD Contact Type Call Who Is Calling Patient / Member / Family / Caregiver Call Type Triage / Clinical Relationship To Patient Self Return Phone Number [phone] (Primary) Chief Complaint Medication reaction Reason for Call Symptomatic / Request for [place] states that she had been on antibiotics and has been having diarrhea for several days. She has been off the antibiotic for a week. Amoxicillin Translation No Nurse Assessment Nurse: Rolena Infante, RN, Patrice Date/Time (Eastern Time): 04/30/2021 9:42:30 AM Confirm and document reason for call. If symptomatic, describe symptoms. ---Caller states she has diarrhea since taking an antibiotic but now off antibiotic and still having diarrhea. Started around a few days after starting the antibiotic . Diarrhea x 6 today so far. Watery and loose dark diarrhea and cramping. No nausea. Does the patient have any new or worsening symptoms? ---Yes Will a triage be completed? ---Yes Related visit to physician within the last 2 weeks? ---No Does the PT have any chronic conditions? (i.e. diabetes, asthma, this includes High risk factors for pregnancy, etc.) ---No Is this a behavioral health or substance abuse call? ---No Guidelines Guideline Title Affirmed Question Affirmed Notes Nurse Date/Time (Eastern Time) Diarrhea [1] SEVERE diarrhea (e.g., 7 or more times / day more than normal) AND [2] age > 74 years

## 2021-05-09 ENCOUNTER — Ambulatory Visit (INDEPENDENT_AMBULATORY_CARE_PROVIDER_SITE_OTHER): Payer: BC Managed Care – PPO | Admitting: Psychology

## 2021-05-09 DIAGNOSIS — F4321 Adjustment disorder with depressed mood: Secondary | ICD-10-CM

## 2021-06-13 ENCOUNTER — Encounter: Payer: Self-pay | Admitting: Family Medicine

## 2021-06-19 ENCOUNTER — Encounter: Payer: Self-pay | Admitting: Family Medicine

## 2021-06-19 ENCOUNTER — Other Ambulatory Visit: Payer: Self-pay

## 2021-06-19 ENCOUNTER — Ambulatory Visit: Payer: BC Managed Care – PPO | Admitting: Family Medicine

## 2021-06-19 ENCOUNTER — Other Ambulatory Visit (HOSPITAL_BASED_OUTPATIENT_CLINIC_OR_DEPARTMENT_OTHER): Payer: Self-pay

## 2021-06-19 VITALS — BP 104/80 | HR 71 | Temp 98.1°F | Resp 18 | Ht 64.0 in | Wt 115.4 lb

## 2021-06-19 DIAGNOSIS — R197 Diarrhea, unspecified: Secondary | ICD-10-CM | POA: Diagnosis not present

## 2021-06-19 DIAGNOSIS — F419 Anxiety disorder, unspecified: Secondary | ICD-10-CM | POA: Diagnosis not present

## 2021-06-19 DIAGNOSIS — Z79899 Other long term (current) drug therapy: Secondary | ICD-10-CM

## 2021-06-19 DIAGNOSIS — Z8619 Personal history of other infectious and parasitic diseases: Secondary | ICD-10-CM | POA: Insufficient documentation

## 2021-06-19 MED ORDER — ALPRAZOLAM 0.25 MG PO TABS
0.2500 mg | ORAL_TABLET | Freq: Two times a day (BID) | ORAL | 0 refills | Status: DC | PRN
  Filled 2021-06-19: qty 20, 10d supply, fill #0

## 2021-06-19 NOTE — Addendum Note (Signed)
Addended by: Manuela Schwartz on: 06/19/2021 03:14 PM   Modules accepted: Orders

## 2021-06-19 NOTE — Progress Notes (Addendum)
Subjective:   By signing my name below, I, Shehryar Baig, attest that this documentation has been prepared under the direction and in the presence of Dr. Roma Schanz, DO. 06/19/2021     Patient ID: Adriana Lopez, female    DOB: Jul 20, 1961, 60 y.o.   MRN: [id number]  Chief Complaint  Patient presents with   Bloating    Pt states bloating has been going most of the summer and states having cramping. Pt states diarrhea everyday. No blood. No nausea or vomiting. Pt states no appetite     HPI Patient is in today for a office visit.  She complains of bloating, diarrhea, and abdominal cramps since June, 2022 after taking antibiotics for another unrelated issue. She notes that she stopped taking the antibiotics once her symptoms started. She has seen an urgent care to manage her symptoms and was prescribed probiotics and finds mild relief. She continues having diarrhea 4-5x daily. She finds that anything she eats can trigger her diarrhea.  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 She denies having any fever, nausea at this time. 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 Her fever lasted one day and she did not have any other episodes since then.  She is requesting to start taking xanax again to manage her travel anxiety. She reports she gets unprecedented anxiety when traveling. She notes that it is not related to her symptoms diarrhea and bloating.  She reports of right shoulder pain that starts from her back and radiates from her front. She is attending physical therapy to manage her pain and finds mild relief. 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     Past Medical History:  Diagnosis Date   Allergy    feline   Anxiety     Past Surgical History:  Procedure Laterality Date   REFRACTIVE SURGERY  2012   Dr Lucita Ferrara   SKIN  CANCER EXCISION     right ear lobe   TONSILLECTOMY AND ADENOIDECTOMY      Family History  Problem Relation Age of Onset   Heart disease Father        pacer   Cancer Father        dermatologic   Cancer Maternal Aunt        uterine   Alzheimer's disease Maternal Aunt    Alzheimer's disease Mother    Diabetes Neg Hx    Stroke Neg Hx    Hypertension Neg Hx     Social History   Socioeconomic History   Marital status: Divorced    Spouse name: Not on file   Number of children: Not on file   Years of education: Not on file   Highest education level: Not on file  Occupational History    Employer: [employer]  Tobacco Use   Smoking status: Former    Types: Cigarettes   Smokeless tobacco: Never  Substance and Sexual Activity   Alcohol use: Yes    Alcohol/week: 7.0 standard drinks    Types: 7 Glasses of wine per week   Drug use: No   Sexual activity: Yes  Other Topics Concern   Not on file  Social History Narrative   Exercise- walking   Social Determinants of Health   Financial Resource Strain: Not on file  Food Insecurity: Not on  file  Transportation Needs: Not on file  Physical Activity: Not on file  Stress: Not on file  Social Connections: Not on file  Intimate Partner Violence: Not on file    Outpatient Medications Prior to Visit  Medication Sig Dispense Refill   acetaminophen (TYLENOL) 500 MG tablet Take 500 mg by mouth every 6 (six) hours as needed.     acyclovir (ZOVIRAX) 200 MG capsule TAKE 1 CAPSULE (200 MG TOTAL) BY MOUTH 3 (THREE) TIMES DAILY. 30 capsule 2   azelastine (OPTIVAR) 0.05 % ophthalmic solution Place 1 drop into both eyes 2 (two) times daily. 6 mL 12   cetirizine (ZYRTEC) 10 MG tablet Take 10 mg by mouth daily.     Nutritional Supplements (HRT SUPPORT PO) Take by mouth.     progesterone (PROMETRIUM) 100 MG capsule      progesterone (PROMETRIUM) 100 MG capsule Take 100 mg by mouth at bedtime.     fluconazole (DIFLUCAN) 150 MG  tablet 1 po x1, may repeat in 3 days prn (Patient not taking: Reported on 06/19/2021) 2 tablet 0   predniSONE (DELTASONE) 10 MG tablet Taper as follows: 4-4-3-3-2-2-1-1 (Patient not taking: Reported on 06/19/2021) 20 tablet 0   predniSONE (DELTASONE) 10 MG tablet TAKE 3 TABLETS PO QD FOR 3 DAYS THEN TAKE 2 TABLETS PO QD FOR 3 DAYS THEN TAKE 1 TABLET PO QD FOR 3 DAYS THEN TAKE 1/2 TAB PO QD FOR 3 DAYS (Patient not taking: Reported on 06/19/2021) 20 tablet 0   cyclobenzaprine (FLEXERIL) 10 MG tablet Take 1 tablet (10 mg total) by mouth 3 (three) times daily as needed for muscle spasms. 30 tablet 0   No facility-administered medications prior to visit.    No Known Allergies  Review of Systems  Constitutional:  Negative for fever and malaise/fatigue.  HENT:  Negative for congestion.   Eyes:  Negative for blurred vision.  Respiratory:  Negative for shortness of breath.   Cardiovascular:  Negative for chest pain, palpitations and leg swelling.  Gastrointestinal:  Positive for abdominal pain and diarrhea. Negative for blood in stool and nausea.       (+)bloating  Genitourinary:  Negative for dysuria and frequency.  Musculoskeletal:  Negative for falls.  Skin:  Negative for rash.  Neurological:  Negative for dizziness, loss of consciousness and headaches.  Endo/Heme/Allergies:  Negative for environmental allergies.  Psychiatric/Behavioral:  Negative for depression. The patient is nervous/anxious (while traveling).       Objective:    Physical Exam Vitals and nursing note reviewed.  Constitutional:      General: She is not in acute distress.    Appearance: Normal appearance. She is not ill-appearing.  HENT:     Head: Normocephalic and atraumatic.     Right Ear: External ear normal.     Left Ear: External ear normal.  Eyes:     Extraocular Movements: Extraocular movements intact.     Pupils: Pupils are equal, round, and reactive to light.  Cardiovascular:     Rate and Rhythm: Normal rate  and regular rhythm.     Heart sounds: Normal heart sounds. No murmur heard.   No gallop.  Pulmonary:     Effort: Pulmonary effort is normal. No respiratory distress.     Breath sounds: Normal breath sounds. No wheezing or rales.  Abdominal:     General: There is no distension.     Palpations: Abdomen is soft.     Tenderness: There is abdominal tenderness in the epigastric area.  There is no guarding or rebound.     Comments: Mild epigastric pain  Skin:    General: Skin is warm and dry.  Neurological:     Mental Status: She is alert and oriented to person, place, and time.  Psychiatric:        Mood and Affect: Mood is anxious.        Behavior: Behavior normal.        Judgment: Judgment normal.     Comments: Anxiety with travel only    BP 104/80 (BP Location: Right Arm, Patient Position: Sitting, Cuff Size: Normal)   Pulse 71   Temp 98.1 F (36.7 C) (Oral)   Resp 18   Ht '5\' 4"'$  (1.626 m)   Wt 115 lb 6.4 oz (52.3 kg)   SpO2 99%   BMI 19.81 kg/m  Wt Readings from Last 3 Encounters:  06/19/21 115 lb 6.4 oz (52.3 kg)  01/29/21 116 lb 9.6 oz (52.9 kg)  07/24/20 115 lb (52.2 kg)    Diabetic Foot Exam - Simple   No data filed    Lab Results  Component Value Date   WBC 8.1 05/01/2021   HGB 12.8 05/01/2021   HCT 37.2 05/01/2021   PLT 356.0 05/01/2021   GLUCOSE 93 05/01/2021   CHOL 194 12/22/2019   TRIG 47.0 12/22/2019   HDL 97.60 12/22/2019   LDLCALC 87 12/22/2019   ALT 9 05/01/2021   AST 16 05/01/2021   NA 140 05/01/2021   K 4.5 05/01/2021   CL 103 05/01/2021   CREATININE 0.66 05/01/2021   BUN 10 05/01/2021   CO2 30 05/01/2021   TSH 2.37 12/22/2019    Lab Results  Component Value Date   TSH 2.37 12/22/2019   Lab Results  Component Value Date   WBC 8.1 05/01/2021   HGB 12.8 05/01/2021   HCT 37.2 05/01/2021   MCV 99.0 05/01/2021   PLT 356.0 05/01/2021   Lab Results  Component Value Date   NA 140 05/01/2021   K 4.5 05/01/2021   CO2 30 05/01/2021    GLUCOSE 93 05/01/2021   BUN 10 05/01/2021   CREATININE 0.66 05/01/2021   BILITOT 0.6 05/01/2021   ALKPHOS 52 05/01/2021   AST 16 05/01/2021   ALT 9 05/01/2021   PROT 6.4 05/01/2021   ALBUMIN 4.2 05/01/2021   CALCIUM 9.3 05/01/2021   GFR 94.97 05/01/2021   Lab Results  Component Value Date   CHOL 194 12/22/2019   Lab Results  Component Value Date   HDL 97.60 12/22/2019   Lab Results  Component Value Date   LDLCALC 87 12/22/2019   Lab Results  Component Value Date   TRIG 47.0 12/22/2019   Lab Results  Component Value Date   CHOLHDL 2 12/22/2019   No results found for: HGBA1C     Assessment & Plan:   Problem List Items Addressed This Visit       Unprioritized   Anxiety    Anxiety with travel Xanax prn  Contract updated and uds ordered       Relevant Medications   ALPRAZolam (XANAX) 0.25 MG tablet   Diarrhea - Primary    Suspect c diff due to abx use just prior to when it started Check c diff Consider starting flagyl and GI referral      Relevant Orders   C. difficile GDH and Toxin A/B   Other Visit Diagnoses     High risk medication use  Relevant Orders   Drug Monitoring Panel [id number] , Urine        Meds ordered this encounter  Medications   ALPRAZolam (XANAX) 0.25 MG tablet    Sig: Take 1 tablet (0.25 mg total) by mouth 2 (two) times daily as needed for anxiety.    Dispense:  20 tablet    Refill:  0    I, Dr. Roma Schanz, DO, personally preformed the services described in this documentation.  All medical record entries made by the scribe were at my direction and in my presence.  I have reviewed the chart and discharge instructions (if applicable) and agree that the record reflects my personal performance and is accurate and complete. 06/19/2021   I,Shehryar Baig,acting as a scribe for Ann Held, DO.,have documented all relevant documentation on the behalf of Ann Held, DO,as directed by  Ann Held, DO while in the presence of Ann Held, DO.   Ann Held, DO

## 2021-06-19 NOTE — Patient Instructions (Signed)
https://www.cdc.gov/cdiff/index.html">  Clostridioides Difficile Infection Clostridioides difficile infection, also known as C. difficile or C. diff infection, happens when too much C. diff bacteria grows. This can cause severe diarrhea and inflammation of the colon (colitis). It is linked to recent use of antibiotic medicine. This infection can be passed from person to person (is contagious). You also may be exposed to the bacteria from contact with food, water, orsurfaces that have the bacteria on them. What are the causes? Certain bacteria live in the colon and help to digest food. This infection develops when the balance of helpful bacteria in the colon changes and the C. diffbacteria grow out of control. This is often caused by taking antibiotics. What increases the risk? You may be more likely to develop this condition if you: Take certain antibiotics that kill many types of bacteria or take antibiotics for a long time. Have an extended stay in a hospital or long-term care facility. Are older than age 54. Have had a C. diff infection before or have been exposed to C. diff bacteria. Have a weakened disease-fighting system (immune system). Take a medicine to reduce stomach acid, such as a proton pump inhibitor, for a long time. Have a serious underlying condition, such as colon cancer or inflammatory bowel disease (IBD). Have had a gastrointestinal (GI) tract procedure. What are the signs or symptoms? Symptoms of this condition include: Diarrhea (three or more times a day) for several days. Fever. Loss of appetite. Nausea. Swelling, pain, cramping, or tenderness in the abdomen. How is this diagnosed? This condition is diagnosed with: Your medical history and a physical exam. Tests, which may include: A test for C. diff in your stool (feces). Blood tests. Imaging tests, such as a CT scan of your abdomen. A procedure in which your colon is examined. This is rare. How is this  treated? Treatment for this condition may include: Stopping the antibiotics that you were taking when the C. diff infection began. Do this only as told by your health care provider. Taking certain antibiotics to stop C. diff growth. Taking stool from a healthy person and placing it into your colon (fecal transplant). This may be done if the infection keeps coming back. Having surgery to remove the infected part of the colon. This is rare. Follow these instructions at home: Medicines Take over-the-counter and prescription medicines only as told by your health care provider. Take antibiotic medicine as told by your health care provider. Do not stop taking the antibiotic even if you start to feel better. Do not treat diarrhea with medicines unless your health care provider tells you to. Eating and drinking  Follow instructions from your health care provider about eating and drinking restrictions. Eat bland foods in small amounts that are easy to digest. These include bananas, applesauce, rice, lean meats, toast, and crackers. Follow instructions on replacing body fluid that has been lost (rehydrate). This may include: Drinking clear fluids, such as water, clear fruit juice that is diluted with water, and low-calorie sports drinks. Sucking on ice chips. Taking an oral rehydration solution (ORS). This drink is sold at pharmacies and retail stores. Avoid milk, caffeine, and alcohol. Drink enough fluid to keep your urine pale yellow.  General instructions Wash your hands often with soap and water for at least 20 seconds. Bathe or shower using soap and water daily. Return to your normal activities as told by your health care provider. Ask your health care provider what activities are safe for you. Be sure your  home is clean before you leave the hospital or clinic to go home. Then continue daily cleaning for at least a week. Keep all follow-up visits. This is important. How is this prevented? Hand  hygiene  Wash your hands with soap and water for at least 20 seconds before preparing food and after using the bathroom. Make sure the people you live with also wash their hands often with soap and water for at least 20 seconds. If you are being treated at a hospital or clinic, make sure that all health care providers and visitors wash their hands with soap and water before touching you.  Contact precautions Tell your health care team right away if you develop diarrhea while in a hospital or long-term care facility. When visiting someone in a hospital or a long-term care facility, follow guidelines for wearing a gown, gloves, or other protective equipment. If possible, avoid contact with people who have diarrhea. Use a separate bathroom if you are sick and live with other people, if possible. Clean environment Clean surfaces that are touched often every day. C. diff bacteria are killed only by cleaning products that contain 10% chlorine bleach solution. Be sure to: Read the product's label to make sure the product will kill the bacteria on the surface you are cleaning. Clean frequently touched surfaces, such as toilet seats and flush handles, bathtubs, sinks, doorknobs, and work surfaces. If you are in the hospital, make sure that staff members clean the surfaces in your room daily. Let a staff person know right away if body fluids have splashed or spilled. Washing clothes and linens Use a powder laundry detergent containing chlorine bleach instead of liquid detergent. Powder detergents contain chlorine bleach in low levels to help kill bacteria. Run your empty washing machine on the hot setting once a month with enough detergent for a full load. This will kill any remaining C. diff bacteria. Contact a health care provider if: Your symptoms do not get better, or they get worse, even with treatment. Your symptoms go away and then come back. You have a fever. You develop new symptoms. Get help  right away if: You have more pain or tenderness in your abdomen. You have stool that is mostly bloody, or looks black and tarry. You cannot eat or drink without vomiting. You have signs of dehydration, such as: Dark urine, very little urine, or no urine. Cracked lips or dry mouth. Not making tears when you cry. Sunken eyes. Sleepiness. Weakness or dizziness. Summary Clostridioides difficile infection, or C. diff infection, can cause severe diarrhea and inflammation of the colon (colitis). It is linked to recent antibiotic use. C. diff infection can spread from person to person (is contagious). You also may be exposed to the bacteria from contact with food, water, or surfaces that have the bacteria on them. This infection may be treated by stopping the antibiotics you were using when the infection began. Fecal transplant or surgery may be needed for repeat or severe infections. Washing hands with soap and water for at least 20 seconds after you use the bathroom and before you eat, and cleaning surfaces with a 10% bleach solution, can help prevent or limit spread of this infection. This information is not intended to replace advice given to you by your health care provider. Make sure you discuss any questions you have with your healthcare provider. Document Revised: 02/03/2020 Document Reviewed: 02/03/2020 Elsevier Patient Education  [place].

## 2021-06-19 NOTE — Assessment & Plan Note (Signed)
Anxiety with travel Xanax prn  Contract updated and uds ordered

## 2021-06-19 NOTE — Assessment & Plan Note (Signed)
Suspect c diff due to abx use just prior to when it started Check c diff Consider starting flagyl and GI referral

## 2021-06-20 ENCOUNTER — Telehealth: Payer: Self-pay

## 2021-06-20 NOTE — Telephone Encounter (Signed)
Patient was seen by PCP with diarrhea for several weeks, abdominal exam with mild tenderness at the epigastric area. Okay to be managed by PCP tomorrow

## 2021-06-20 NOTE — Telephone Encounter (Signed)
Received urgent lab call from [place] regarding patient's stool sample.  Results:  GDH Antigen: Detected Toxin A and B: Detected  Comments: Toxigenic C. Difficile detected.   Please advise

## 2021-06-21 ENCOUNTER — Other Ambulatory Visit (HOSPITAL_BASED_OUTPATIENT_CLINIC_OR_DEPARTMENT_OTHER): Payer: Self-pay

## 2021-06-21 ENCOUNTER — Other Ambulatory Visit: Payer: Self-pay

## 2021-06-21 MED ORDER — VANCOMYCIN HCL 125 MG PO CAPS
125.0000 mg | ORAL_CAPSULE | Freq: Four times a day (QID) | ORAL | 0 refills | Status: AC
  Filled 2021-06-21: qty 40, 10d supply, fill #0

## 2021-06-21 NOTE — Telephone Encounter (Signed)
Spoke with patient. Pt verbalized understanding. Rx sent in

## 2021-06-22 LAB — DRUG MONITORING PANEL 376104, URINE
Amphetamines: NEGATIVE ng/mL (ref <500)
Barbiturates: NEGATIVE ng/mL (ref <300)
Benzodiazepines: NEGATIVE ng/mL (ref <100)
Cocaine Metabolite: NEGATIVE ng/mL (ref <150)
Desmethyltramadol: NEGATIVE ng/mL (ref <100)
Opiates: NEGATIVE ng/mL (ref <100)
Oxycodone: NEGATIVE ng/mL (ref <100)
Tramadol: NEGATIVE ng/mL (ref <100)

## 2021-06-22 LAB — DM TEMPLATE

## 2021-06-25 ENCOUNTER — Ambulatory Visit (INDEPENDENT_AMBULATORY_CARE_PROVIDER_SITE_OTHER): Payer: BC Managed Care – PPO | Admitting: Psychology

## 2021-06-25 DIAGNOSIS — F4321 Adjustment disorder with depressed mood: Secondary | ICD-10-CM

## 2021-07-03 ENCOUNTER — Other Ambulatory Visit: Payer: Self-pay | Admitting: Family Medicine

## 2021-07-03 ENCOUNTER — Encounter: Payer: Self-pay | Admitting: Family Medicine

## 2021-07-03 DIAGNOSIS — A0472 Enterocolitis due to Clostridium difficile, not specified as recurrent: Secondary | ICD-10-CM

## 2021-07-03 DIAGNOSIS — A09 Infectious gastroenteritis and colitis, unspecified: Secondary | ICD-10-CM

## 2021-07-03 NOTE — Telephone Encounter (Signed)
Pt dx with C-Diff on 06/20/21. Pt states still having diarrhea. Please advise

## 2021-07-04 MED ORDER — METRONIDAZOLE 500 MG PO TABS: 500.0000 mg | ORAL_TABLET | Freq: Three times a day (TID) | ORAL | 0 refills | Status: DC

## 2021-07-09 ENCOUNTER — Encounter: Payer: Self-pay | Admitting: Family Medicine

## 2021-07-10 NOTE — Telephone Encounter (Signed)
fyi

## 2021-07-16 NOTE — Telephone Encounter (Signed)
Pt would like to know if she should still see GI if the diarrhea has stopped?

## 2021-07-19 ENCOUNTER — Ambulatory Visit: Payer: BC Managed Care – PPO | Admitting: Gastroenterology

## 2021-07-19 ENCOUNTER — Encounter: Payer: Self-pay | Admitting: Gastroenterology

## 2021-07-19 ENCOUNTER — Other Ambulatory Visit: Payer: Self-pay

## 2021-07-19 VITALS — BP 112/72 | HR 70 | Ht 64.0 in | Wt 115.0 lb

## 2021-07-19 DIAGNOSIS — Z8619 Personal history of other infectious and parasitic diseases: Secondary | ICD-10-CM

## 2021-07-19 DIAGNOSIS — Z1211 Encounter for screening for malignant neoplasm of colon: Secondary | ICD-10-CM

## 2021-07-19 DIAGNOSIS — R197 Diarrhea, unspecified: Secondary | ICD-10-CM

## 2021-07-19 NOTE — Patient Instructions (Signed)
If you are age 61 or older, your body mass index should be between 23-30. Your Body mass index is 19.74 kg/m. If this is out of the aforementioned range listed, please consider follow up with your Primary Care Provider.  If you are age 63 or younger, your body mass index should be between 19-25. Your Body mass index is 19.74 kg/m. If this is out of the aformentioned range listed, please consider follow up with your Primary Care Provider.   __________________________________________________________  The [facility] providers would like to encourage you to use [facility] to communicate with providers for non-urgent requests or questions.  Due to long hold times on the telephone, sending your provider a message by [facility] may be a faster and more efficient way to get a response.  Please allow 48 business hours for a response.  Please remember that this is for non-urgent requests.   Due to recent changes in healthcare laws, you may see the results of your imaging and laboratory studies on MyChart before your provider has had a chance to review them.  We understand that in some cases there may be results that are confusing or concerning to you. Not all laboratory results come back in the same time frame and the provider may be waiting for multiple results in order to interpret others.  Please give [place] 48 hours in order for your provider to thoroughly review all the results before contacting the office for clarification of your results.   Return to the clinic as needed.  Thank you for choosing me and [facility].  Vito Cirigliano, D.O.

## 2021-07-19 NOTE — Progress Notes (Signed)
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  Symptoms started after taking Amoxicillin for stye. She initially started on probiotics after being seen in Urgent Care, but stopped taking when diagnosed with CDI.  Was seen by her PCM for this issue on 06/19/2021 with evaluation notable for C. difficile positive; started on vancomycin 125 mg qid x10 days.  Due to ongoing symptoms, prescribed Flagyl 500 mg tid x10 days on 07/03/2021.  However, symptoms actually stopped the day after completing vancomycin, so she never took the Flagyl.  Today, she states her diarrhea symptoms have completely resolved and no recurrence.  Stool consistency still not completely back to baseline, but no more diarrhea or increased to frequency.  P.o. intake back to baseline.  No abdominal pain, fever, chills, nausea, vomiting.  She is otherwise without any GI complaints today.   Negative Cologuard about 2 years ago per patient.  No previous colonoscopy.  No known family history of CRC, GI malignancy, liver disease, pancreatic disease, or IBD.    Past Medical History:  Diagnosis Date   Allergy    feline   Anxiety      Past Surgical History:  Procedure Laterality Date   REFRACTIVE SURGERY  2012   Dr Lucita Ferrara   SKIN CANCER EXCISION     right ear lobe   TONSILLECTOMY AND ADENOIDECTOMY     Family History  Problem Relation Age of Onset   Alzheimer's disease Mother    Heart disease Father        pacer   Cancer Father        dermatologic   Cancer Maternal Aunt        uterine   Alzheimer's disease Maternal Aunt    Diabetes Neg Hx    Stroke Neg Hx    Hypertension Neg Hx    Colon cancer Neg Hx    Throat cancer Neg Hx    Rectal cancer Neg Hx    Pancreatic  cancer Neg Hx    Social History   Tobacco Use   Smoking status: Former    Types: Cigarettes   Smokeless tobacco: Never  Vaping Use   Vaping Use: Never used  Substance Use Topics   Alcohol use: Yes    Alcohol/week: 7.0 standard drinks    Types: 7 Glasses of wine per week   Drug use: No   Current Outpatient Medications  Medication Sig Dispense Refill   acyclovir (ZOVIRAX) 200 MG capsule TAKE 1 CAPSULE (200 MG TOTAL) BY MOUTH 3 (THREE) TIMES DAILY. 30 capsule 2   ALPRAZolam (XANAX) 0.25 MG tablet Take 1 tablet (0.25 mg total) by mouth 2 (two) times daily as needed for anxiety. 20 tablet 0   azelastine (OPTIVAR) 0.05 % ophthalmic solution Place 1 drop into both eyes 2 (two) times daily. 6 mL 12   cetirizine (ZYRTEC) 10 MG tablet Take 10 mg by mouth daily.     Nutritional Supplements (HRT SUPPORT PO) Take by mouth.     progesterone (PROMETRIUM) 100 MG capsule      progesterone (PROMETRIUM) 100 MG capsule Take 100 mg by mouth at bedtime.     No current facility-administered medications for this visit.  Allergies  Allergen Reactions   Amoxicillin Other (See Comments)    Stye     Review of Systems: All systems reviewed and negative except where noted in HPI.     Physical Exam:    Wt Readings from Last 3 Encounters:  07/19/21 115 lb (52.2 kg)  06/19/21 115 lb 6.4 oz (52.3 kg)  01/29/21 116 lb 9.6 oz (52.9 kg)    BP 112/72   Pulse 70   Ht 5\' 4"  (1.626 m)   Wt 115 lb (52.2 kg)   SpO2 97%   BMI 19.74 kg/m  Constitutional:  Pleasant, in no acute distress. Psychiatric: Normal mood and affect. Behavior is normal. Cardiovascular: Normal rate, regular rhythm. No edema Pulmonary/chest: Effort normal and breath sounds normal. No wheezing, rales or rhonchi. Abdominal: Soft, nondistended, nontender. Bowel sounds active throughout. There are no masses palpable. No hepatomegaly. Neurological: Alert and oriented to person place and time. Skin: Skin is warm and dry. No rashes  noted.   ASSESSMENT AND PLAN;   1) C. difficile infection 2) Diarrhea-resolved - Diagnosed with CDI after amoxicillin.  Good response to oral vancomycin - Ok to restart probiotics for the next 4 weeks or so - Counseled patient that it could take up to 6 months for stools to return to baseline.  Okay to use fiber supplement in the interim - Counseled patient that while this was an adverse event from amoxicillin, this does not represent an amoxicillin allergy and should not place the antibiotic on her allergy list  3) Colon cancer screening up-to-date - Cologuard negative about 2 years ago - Patient would like to continue with Cologuard testing every 3 years  RTC as needed   Lavena Bullion, DO, FACG  07/19/2021, 1:19 PM   Carollee Herter, Alferd Apa, *

## 2021-07-23 ENCOUNTER — Ambulatory Visit (INDEPENDENT_AMBULATORY_CARE_PROVIDER_SITE_OTHER): Payer: BC Managed Care – PPO | Admitting: Psychology

## 2021-07-23 DIAGNOSIS — F4321 Adjustment disorder with depressed mood: Secondary | ICD-10-CM

## 2021-08-14 ENCOUNTER — Encounter: Payer: Self-pay | Admitting: Family Medicine

## 2021-08-17 ENCOUNTER — Ambulatory Visit: Payer: BC Managed Care – PPO | Admitting: Family

## 2021-08-17 ENCOUNTER — Other Ambulatory Visit (HOSPITAL_BASED_OUTPATIENT_CLINIC_OR_DEPARTMENT_OTHER): Payer: Self-pay

## 2021-08-17 ENCOUNTER — Other Ambulatory Visit: Payer: Self-pay

## 2021-08-17 VITALS — BP 117/60 | HR 64 | Temp 98.2°F | Resp 16 | Wt 115.0 lb

## 2021-08-17 DIAGNOSIS — J0110 Acute frontal sinusitis, unspecified: Secondary | ICD-10-CM

## 2021-08-17 DIAGNOSIS — M5412 Radiculopathy, cervical region: Secondary | ICD-10-CM | POA: Diagnosis not present

## 2021-08-17 MED ORDER — DOXYCYCLINE HYCLATE 100 MG PO TABS
100.0000 mg | ORAL_TABLET | Freq: Two times a day (BID) | ORAL | 0 refills | Status: DC
  Filled 2021-08-17: qty 20, 10d supply, fill #0

## 2021-08-17 MED ORDER — METHYLPREDNISOLONE 4 MG PO TBPK
ORAL_TABLET | ORAL | 0 refills | Status: DC
  Filled 2021-08-17: qty 21, 6d supply, fill #0

## 2021-08-17 NOTE — Progress Notes (Signed)
Subjective:   By signing my name below, I, Adriana Lopez, attest that this documentation has been prepared under the direction and in the presence of Adriana Alar, NP 08/17/2021    Patient ID: Adriana Lopez, female    DOB: 10/08/1961, 60 y.o.   MRN: [id number]  Chief Complaint  Patient presents with   Shoulder Pain    Patient here for follow up on left shoulder pain.    Facial Pain    Complains of sinus pain and nasal congestion for about 1 month.     Shoulder Pain  Pertinent negatives include no fever.  Patient is in today for an office visit.  Shoulder Pain-  At her last visit in the summer, she complained of left shoulder pain that had started suddenly and made her very uncomfortable. She was given flexeril and tramadol and her shoulder was x-rayed. X ray of C spine in June 2022 noted degenerative cervical spondylosis , DDD, facet disease, mild degenerative anterior subluxation of C3 compared to C4.  She was diagnosed with arthritis. She only used 4 tablets of flexeril which provided little relief and she tried tramadol once. She mentions that the tramadol provided great relief but she was scared to keep using it because it made her comfortable for the whole day. She reports that the pain had started from her upper left back and radiated down to her arm and up to the base of her neck which gives her headaches. She finds it hard to lift her arm, cannot put her hair in a ponytail and cannot sleep on her left side.   Sinus- She had a bout of larygintis that was treated earlier in the summer and is prone to sinus infections. She is still experiencing symptoms of congestion, sinus pressure in her head and green-yellow mucous fluid. 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  Past Medical History:  Diagnosis Date   Allergy    feline   Anxiety     Past Surgical History:  Procedure Laterality Date   REFRACTIVE SURGERY  2012   Dr Lucita Ferrara   SKIN  CANCER EXCISION     right ear lobe   TONSILLECTOMY AND ADENOIDECTOMY      Family History  Problem Relation Age of Onset   Alzheimer's disease Mother    Heart disease Father        pacer   Cancer Father        dermatologic   Cancer Maternal Aunt        uterine   Alzheimer's disease Maternal Aunt    Diabetes Neg Hx    Stroke Neg Hx    Hypertension Neg Hx    Colon cancer Neg Hx    Throat cancer Neg Hx    Rectal cancer Neg Hx    Pancreatic cancer Neg Hx     Social History   Socioeconomic History   Marital status: Divorced    Spouse name: Not on file   Number of children: 2   Years of education: Not on file   Highest education level: Not on file  Occupational History    Employer: [employer]  Tobacco Use   Smoking status: Former    Types: Cigarettes   Smokeless tobacco: Never  Vaping Use   Vaping Use: Never used  Substance and Sexual Activity   Alcohol use: Yes    Alcohol/week: 7.0 standard drinks    Types: 7 Glasses of wine per week  Drug use: No   Sexual activity: Yes  Other Topics Concern   Not on file  Social History Narrative   Exercise- walking   Social Determinants of Health   Financial Resource Strain: Not on file  Food Insecurity: Not on file  Transportation Needs: Not on file  Physical Activity: Not on file  Stress: Not on file  Social Connections: Not on file  Intimate Partner Violence: Not on file    Outpatient Medications Prior to Visit  Medication Sig Dispense Refill   acyclovir (ZOVIRAX) 200 MG capsule TAKE 1 CAPSULE (200 MG TOTAL) BY MOUTH 3 (THREE) TIMES DAILY. 30 capsule 2   ALPRAZolam (XANAX) 0.25 MG tablet Take 1 tablet (0.25 mg total) by mouth 2 (two) times daily as needed for anxiety. 20 tablet 0   azelastine (OPTIVAR) 0.05 % ophthalmic solution Place 1 drop into both eyes 2 (two) times daily. 6 mL 12   cetirizine (ZYRTEC) 10 MG tablet Take 10 mg by mouth daily.     Nutritional Supplements (HRT SUPPORT PO) Take by  mouth.     progesterone (PROMETRIUM) 100 MG capsule Take 100 mg by mouth at bedtime.     progesterone (PROMETRIUM) 100 MG capsule      No facility-administered medications prior to visit.    Allergies  Allergen Reactions   Amoxicillin Diarrhea    Also had C Diff    Review of Systems  Constitutional:  Negative for fever.  HENT:  Positive for congestion and sinus pain. Negative for ear pain and hearing loss.        (-)nystagmus (-)adenopathy (+) rhinorrhea  Eyes:  Negative for blurred vision.  Respiratory:  Negative for cough, shortness of breath and wheezing.   Cardiovascular:  Negative for chest pain and leg swelling.  Gastrointestinal:  Negative for blood in stool, diarrhea, nausea and vomiting.  Genitourinary:  Negative for dysuria and frequency.  Musculoskeletal:  Positive for joint pain (left shoulder). Negative for myalgias.  Skin:  Negative for rash.  Neurological:  Negative for headaches.  Psychiatric/Behavioral:  Negative for depression. The patient is not nervous/anxious.       Objective:    Physical Exam Constitutional:      General: She is not in acute distress.    Appearance: Normal appearance. She is not ill-appearing.  HENT:     Head: Normocephalic and atraumatic.     Right Ear: Tympanic membrane, ear canal and external ear normal.     Left Ear: Tympanic membrane, ear canal and external ear normal.     Nose:     Right Sinus: Frontal sinus tenderness present.     Left Sinus: Frontal sinus tenderness present.  Eyes:     Extraocular Movements: Extraocular movements intact.     Pupils: Pupils are equal, round, and reactive to light.  Cardiovascular:     Rate and Rhythm: Normal rate and regular rhythm.     Pulses: Normal pulses.     Heart sounds: Normal heart sounds. No murmur heard. Pulmonary:     Effort: Pulmonary effort is normal. No respiratory distress.     Breath sounds: Normal breath sounds. No wheezing or rhonchi.  Abdominal:     General: Bowel  sounds are normal. There is no distension.     Palpations: Abdomen is soft.     Tenderness: There is no abdominal tenderness. There is no guarding or rebound.  Musculoskeletal:     Left shoulder: Decreased range of motion.     Cervical back: Neck  supple.     Comments: 5/5 strength in upper and lower extremities  Lymphadenopathy:     Cervical: No cervical adenopathy.  Skin:    General: Skin is warm and dry.  Neurological:     Mental Status: She is alert and oriented to person, place, and time.     Deep Tendon Reflexes:     Reflex Scores:      Bicep reflexes are 2+ on the right side and 2+ on the left side.      Brachioradialis reflexes are 2+ on the right side and 2+ on the left side.      Patellar reflexes are 3+ on the right side and 3+ on the left side. Psychiatric:        Behavior: Behavior normal.        Judgment: Judgment normal.    BP 117/60 (BP Location: Right Arm, Patient Position: Sitting, Cuff Size: Small)   Pulse 64   Temp 98.2 F (36.8 C) (Oral)   Resp 16   Wt 115 lb (52.2 kg)   SpO2 99%   BMI 19.74 kg/m  Wt Readings from Last 3 Encounters:  08/17/21 115 lb (52.2 kg)  07/19/21 115 lb (52.2 kg)  06/19/21 115 lb 6.4 oz (52.3 kg)    Diabetic Foot Exam - Simple   No data filed    Lab Results  Component Value Date   WBC 8.1 05/01/2021   HGB 12.8 05/01/2021   HCT 37.2 05/01/2021   PLT 356.0 05/01/2021   GLUCOSE 93 05/01/2021   CHOL 194 12/22/2019   TRIG 47.0 12/22/2019   HDL 97.60 12/22/2019   LDLCALC 87 12/22/2019   ALT 9 05/01/2021   AST 16 05/01/2021   NA 140 05/01/2021   K 4.5 05/01/2021   CL 103 05/01/2021   CREATININE 0.66 05/01/2021   BUN 10 05/01/2021   CO2 30 05/01/2021   TSH 2.37 12/22/2019    Lab Results  Component Value Date   TSH 2.37 12/22/2019   Lab Results  Component Value Date   WBC 8.1 05/01/2021   HGB 12.8 05/01/2021   HCT 37.2 05/01/2021   MCV 99.0 05/01/2021   PLT 356.0 05/01/2021   Lab Results  Component Value  Date   NA 140 05/01/2021   K 4.5 05/01/2021   CO2 30 05/01/2021   GLUCOSE 93 05/01/2021   BUN 10 05/01/2021   CREATININE 0.66 05/01/2021   BILITOT 0.6 05/01/2021   ALKPHOS 52 05/01/2021   AST 16 05/01/2021   ALT 9 05/01/2021   PROT 6.4 05/01/2021   ALBUMIN 4.2 05/01/2021   CALCIUM 9.3 05/01/2021   GFR 94.97 05/01/2021   Lab Results  Component Value Date   CHOL 194 12/22/2019   Lab Results  Component Value Date   HDL 97.60 12/22/2019   Lab Results  Component Value Date   LDLCALC 87 12/22/2019   Lab Results  Component Value Date   TRIG 47.0 12/22/2019   Lab Results  Component Value Date   CHOLHDL 2 12/22/2019   No results found for: HGBA1C     Assessment & Plan:   Problem List Items Addressed This Visit       Unprioritized   Cervical radiculopathy - Primary    Uncontrolled. No improvement with physical therapy.  Will rx with medrol dose pak. Pt is advised to call if symptoms worsen or if not improved in 2 weeks.       Acute frontal sinusitis    New. Will  rx with doxycycline. She had c diff with amoxicillin.       Relevant Medications   methylPREDNISolone (MEDROL DOSEPAK) 4 MG TBPK tablet   doxycycline (VIBRA-TABS) 100 MG tablet   Meds ordered this encounter  Medications   methylPREDNISolone (MEDROL DOSEPAK) 4 MG TBPK tablet    Sig: Take per package instructions, see back of pack    Dispense:  21 tablet    Refill:  0    Order Specific Question:   Supervising Provider    Answer:   Penni Homans A [[id number]]   doxycycline (VIBRA-TABS) 100 MG tablet    Sig: Take 1 tablet (100 mg total) by mouth 2 (two) times daily.    Dispense:  20 tablet    Refill:  0    Order Specific Question:   Supervising Provider    Answer:   Penni Homans A [[id number]]     I,Adriana Lopez,acting as a scribe for Nance Pear, NP.,have documented all relevant documentation on the behalf of Nance Pear, NP,as directed by  Nance Pear, NP while in the presence of  Nance Pear, NP.   I, Adriana Alar, NP, personally preformed the services described in this documentation.  All medical record entries made by the scribe were at my direction and in my presence.  I have reviewed the chart and discharge instructions (if applicable) and agree that the record reflects my personal performance and is accurate and complete. 08/17/2021

## 2021-08-17 NOTE — Assessment & Plan Note (Signed)
New. Will rx with doxycycline. She had c diff with amoxicillin.

## 2021-08-17 NOTE — Assessment & Plan Note (Signed)
Uncontrolled. No improvement with physical therapy.  Will rx with medrol dose pak. Pt is advised to call if symptoms worsen or if not improved in 2 weeks.

## 2021-08-17 NOTE — Progress Notes (Deleted)
Subjective:   By signing my name below, I, [name], attest that this documentation has been prepared under the direction and in the presence of [name], NP 08/17/2021    Patient ID: Adriana Lopez, female    DOB: May 19, 1961, 60 y.o.   MRN: [id number]  Chief Complaint  Patient presents with   Shoulder Pain    Patient here for follow up on left shoulder pain.    Facial Pain    Complains of sinus pain and nasal congestion for about 1 month.     HPI Patient is in today for an office visit.  Shoulder Pain-   Sinus -   Past Medical History:  Diagnosis Date   Allergy    feline   Anxiety     Past Surgical History:  Procedure Laterality Date   REFRACTIVE SURGERY  2012   Dr Lucita Ferrara   SKIN CANCER EXCISION     right ear lobe   TONSILLECTOMY AND ADENOIDECTOMY      Family History  Problem Relation Age of Onset   Alzheimer's disease Mother    Heart disease Father        pacer   Cancer Father        dermatologic   Cancer Maternal Aunt        uterine   Alzheimer's disease Maternal Aunt    Diabetes Neg Hx    Stroke Neg Hx    Hypertension Neg Hx    Colon cancer Neg Hx    Throat cancer Neg Hx    Rectal cancer Neg Hx    Pancreatic cancer Neg Hx     Social History   Socioeconomic History   Marital status: Divorced    Spouse name: Not on file   Number of children: 2   Years of education: Not on file   Highest education level: Not on file  Occupational History    Employer: [employer]  Tobacco Use   Smoking status: Former    Types: Cigarettes   Smokeless tobacco: Never  Vaping Use   Vaping Use: Never used  Substance and Sexual Activity   Alcohol use: Yes    Alcohol/week: 7.0 standard drinks    Types: 7 Glasses of wine per week   Drug use: No   Sexual activity: Yes  Other Topics Concern   Not on file  Social History Narrative   Exercise- walking   Social Determinants of Health   Financial Resource Strain: Not on file   Food Insecurity: Not on file  Transportation Needs: Not on file  Physical Activity: Not on file  Stress: Not on file  Social Connections: Not on file  Intimate Partner Violence: Not on file    Outpatient Medications Prior to Visit  Medication Sig Dispense Refill   acyclovir (ZOVIRAX) 200 MG capsule TAKE 1 CAPSULE (200 MG TOTAL) BY MOUTH 3 (THREE) TIMES DAILY. 30 capsule 2   ALPRAZolam (XANAX) 0.25 MG tablet Take 1 tablet (0.25 mg total) by mouth 2 (two) times daily as needed for anxiety. 20 tablet 0   azelastine (OPTIVAR) 0.05 % ophthalmic solution Place 1 drop into both eyes 2 (two) times daily. 6 mL 12   cetirizine (ZYRTEC) 10 MG tablet Take 10 mg by mouth daily.     Nutritional Supplements (HRT SUPPORT PO) Take by mouth.     progesterone (PROMETRIUM) 100 MG capsule Take 100 mg by mouth at bedtime.     progesterone (PROMETRIUM) 100 MG capsule  No facility-administered medications prior to visit.    No Active Allergies  Review of Systems  Constitutional:  Negative for fever.  HENT:  Negative for ear pain and hearing loss.        (-)nystagmus (-)adenopathy  Eyes:  Negative for blurred vision.  Respiratory:  Negative for cough, shortness of breath and wheezing.   Cardiovascular:  Negative for chest pain and leg swelling.  Gastrointestinal:  Negative for blood in stool, diarrhea, nausea and vomiting.  Genitourinary:  Negative for dysuria and frequency.  Musculoskeletal:  Negative for joint pain and myalgias.  Skin:  Negative for rash.  Neurological:  Negative for headaches.  Psychiatric/Behavioral:  Negative for depression. The patient is not nervous/anxious.       Objective:    Physical Exam Constitutional:      General: She is not in acute distress.    Appearance: Normal appearance. She is not ill-appearing.  HENT:     Head: Normocephalic and atraumatic.     Right Ear: External ear normal.     Left Ear: External ear normal.  Eyes:     Extraocular Movements:  Extraocular movements intact.     Pupils: Pupils are equal, round, and reactive to light.  Cardiovascular:     Rate and Rhythm: Normal rate and regular rhythm.     Pulses: Normal pulses.     Heart sounds: Normal heart sounds. No murmur heard. Pulmonary:     Effort: Pulmonary effort is normal. No respiratory distress.     Breath sounds: Normal breath sounds. No wheezing or rhonchi.  Abdominal:     General: Bowel sounds are normal. There is no distension.     Palpations: Abdomen is soft.     Tenderness: There is no abdominal tenderness. There is no guarding or rebound.  Musculoskeletal:     Cervical back: Neck supple.  Lymphadenopathy:     Cervical: No cervical adenopathy.  Skin:    General: Skin is warm and dry.  Neurological:     Mental Status: She is alert and oriented to person, place, and time.  Psychiatric:        Behavior: Behavior normal.        Judgment: Judgment normal.    BP 117/60 (BP Location: Right Arm, Patient Position: Sitting, Cuff Size: Small)   Pulse 64   Temp 98.2 F (36.8 C) (Oral)   Resp 16   Wt 115 lb (52.2 kg)   SpO2 99%   BMI 19.74 kg/m  Wt Readings from Last 3 Encounters:  08/17/21 115 lb (52.2 kg)  07/19/21 115 lb (52.2 kg)  06/19/21 115 lb 6.4 oz (52.3 kg)    Diabetic Foot Exam - Simple   No data filed    Lab Results  Component Value Date   WBC 8.1 05/01/2021   HGB 12.8 05/01/2021   HCT 37.2 05/01/2021   PLT 356.0 05/01/2021   GLUCOSE 93 05/01/2021   CHOL 194 12/22/2019   TRIG 47.0 12/22/2019   HDL 97.60 12/22/2019   LDLCALC 87 12/22/2019   ALT 9 05/01/2021   AST 16 05/01/2021   NA 140 05/01/2021   K 4.5 05/01/2021   CL 103 05/01/2021   CREATININE 0.66 05/01/2021   BUN 10 05/01/2021   CO2 30 05/01/2021   TSH 2.37 12/22/2019    Lab Results  Component Value Date   TSH 2.37 12/22/2019   Lab Results  Component Value Date   WBC 8.1 05/01/2021   HGB 12.8 05/01/2021   HCT  37.2 05/01/2021   MCV 99.0 05/01/2021   PLT 356.0  05/01/2021   Lab Results  Component Value Date   NA 140 05/01/2021   K 4.5 05/01/2021   CO2 30 05/01/2021   GLUCOSE 93 05/01/2021   BUN 10 05/01/2021   CREATININE 0.66 05/01/2021   BILITOT 0.6 05/01/2021   ALKPHOS 52 05/01/2021   AST 16 05/01/2021   ALT 9 05/01/2021   PROT 6.4 05/01/2021   ALBUMIN 4.2 05/01/2021   CALCIUM 9.3 05/01/2021   GFR 94.97 05/01/2021   Lab Results  Component Value Date   CHOL 194 12/22/2019   Lab Results  Component Value Date   HDL 97.60 12/22/2019   Lab Results  Component Value Date   LDLCALC 87 12/22/2019   Lab Results  Component Value Date   TRIG 47.0 12/22/2019   Lab Results  Component Value Date   CHOLHDL 2 12/22/2019   No results found for: HGBA1C     Assessment & Plan:   Problem List Items Addressed This Visit   None  No orders of the defined types were placed in this encounter.   I,[name],acting as a Education administrator for [employer], NP.,have documented all relevant documentation on the behalf of Nance Pear, NP,as directed by  Nance Pear, NP while in the presence of Nance Pear, NP.   I, [name], NP, personally preformed the services described in this documentation.  All medical record entries made by the scribe were at my direction and in my presence.  I have reviewed the chart and discharge instructions (if applicable) and agree that the record reflects my personal performance and is accurate and complete. 08/17/2021

## 2021-08-17 NOTE — Patient Instructions (Signed)
Please begin medrol dose pak (steroid) for neck pain. For sinus infection, please begin doxycycline.

## 2021-08-20 ENCOUNTER — Ambulatory Visit: Payer: BC Managed Care – PPO | Admitting: Family Medicine

## 2021-08-27 ENCOUNTER — Encounter: Payer: Self-pay | Admitting: Family

## 2021-08-27 DIAGNOSIS — M5412 Radiculopathy, cervical region: Secondary | ICD-10-CM

## 2021-08-28 ENCOUNTER — Encounter: Payer: Self-pay | Admitting: Family

## 2021-08-28 ENCOUNTER — Other Ambulatory Visit (HOSPITAL_BASED_OUTPATIENT_CLINIC_OR_DEPARTMENT_OTHER): Payer: Self-pay

## 2021-08-28 MED ORDER — CLOTRIMAZOLE-BETAMETHASONE 1-0.05 % EX CREA
1.0000 "application " | TOPICAL_CREAM | Freq: Two times a day (BID) | CUTANEOUS | 0 refills | Status: DC
  Filled 2021-08-28: qty 30, 15d supply, fill #0

## 2021-08-28 NOTE — Telephone Encounter (Signed)
Information sent to provider in another message

## 2021-08-29 ENCOUNTER — Ambulatory Visit (INDEPENDENT_AMBULATORY_CARE_PROVIDER_SITE_OTHER): Payer: BC Managed Care – PPO | Admitting: Psychology

## 2021-08-29 ENCOUNTER — Telehealth: Payer: Self-pay | Admitting: Family

## 2021-08-29 DIAGNOSIS — F4321 Adjustment disorder with depressed mood: Secondary | ICD-10-CM

## 2021-08-29 NOTE — Telephone Encounter (Signed)
Completed peer to peer for MRI C spine. Received approval number: [id number].

## 2021-08-30 ENCOUNTER — Encounter: Payer: Self-pay | Admitting: Family

## 2021-09-01 ENCOUNTER — Other Ambulatory Visit: Payer: Self-pay

## 2021-09-01 ENCOUNTER — Ambulatory Visit (HOSPITAL_BASED_OUTPATIENT_CLINIC_OR_DEPARTMENT_OTHER)
Admission: RE | Admit: 2021-09-01 | Discharge: 2021-09-01 | Disposition: A | Discharge status: Home / Self Care | Payer: BC Managed Care – PPO | Source: Ambulatory Visit | Attending: Family | Admitting: Family

## 2021-09-01 DIAGNOSIS — M5412 Radiculopathy, cervical region: Secondary | ICD-10-CM | POA: Diagnosis present

## 2021-09-05 ENCOUNTER — Telehealth: Payer: Self-pay | Admitting: Family

## 2021-09-05 DIAGNOSIS — M542 Cervicalgia: Secondary | ICD-10-CM

## 2021-09-05 NOTE — Telephone Encounter (Signed)
See mychart.  

## 2021-09-17 ENCOUNTER — Other Ambulatory Visit (HOSPITAL_BASED_OUTPATIENT_CLINIC_OR_DEPARTMENT_OTHER): Payer: Self-pay | Admitting: Physical Medicine and Rehabilitation

## 2021-09-17 DIAGNOSIS — M7542 Impingement syndrome of left shoulder: Secondary | ICD-10-CM

## 2021-09-29 ENCOUNTER — Ambulatory Visit (HOSPITAL_BASED_OUTPATIENT_CLINIC_OR_DEPARTMENT_OTHER)
Admission: RE | Admit: 2021-09-29 | Discharge: 2021-09-29 | Disposition: A | Discharge status: Home / Self Care | Payer: BC Managed Care – PPO | Source: Ambulatory Visit | Attending: Physical Medicine and Rehabilitation | Admitting: Physical Medicine and Rehabilitation

## 2021-09-29 ENCOUNTER — Other Ambulatory Visit: Payer: Self-pay

## 2021-09-29 DIAGNOSIS — M7542 Impingement syndrome of left shoulder: Secondary | ICD-10-CM | POA: Diagnosis not present

## 2021-10-01 LAB — C. DIFFICILE GDH AND TOXIN A/B
GDH ANTIGEN: DETECTED
MICRO NUMBER:: 12279623
SPECIMEN QUALITY:: ADEQUATE
TOXIN A AND B: DETECTED

## 2021-10-08 ENCOUNTER — Ambulatory Visit (INDEPENDENT_AMBULATORY_CARE_PROVIDER_SITE_OTHER): Payer: BC Managed Care – PPO | Admitting: Psychology

## 2021-10-08 DIAGNOSIS — F4321 Adjustment disorder with depressed mood: Secondary | ICD-10-CM | POA: Diagnosis not present

## 2021-10-08 NOTE — Progress Notes (Signed)
[facility] Counselor/Therapist Progress Note  Patient ID: Stephana Morell, MRN: [id number],    Date: 10/08/2021  Time Spent: 60 minutes   Treatment Type: Individual Therapy  Reported Symptoms: sadness, irritability  Mental Status Exam: Appearance:  Casual     Behavior: Appropriate  Motor: Normal  Speech/Language:  Normal Rate  Affect: Appropriate  Mood: normal  Thought process: normal  Thought content:   WNL  Sensory/Perceptual disturbances:   WNL  Orientation: oriented to person, place, time/date, and situation  Attention: Good  Concentration: Good  Memory: WNL  Fund of knowledge:  Good  Insight:   Good  Judgment:  Good  Impulse Control: Good   Risk Assessment: Danger to Self:  No Self-injurious Behavior: No Danger to Others: No Duty to Warn:no Physical Aggression / Violence:No  Access to Firearms a concern: No  Gang Involvement:No   Subjective:  Pt present for individual face-to-face therapy via Webex.   Pt consented to telehealth video session due to [place] 19 pandemic.   Location of pt: home Location of therapist: home office. Pt talked about Mark's 61 yo mother being diagnosed with a mass that could be cancer.  She is having surgery tomorrow to get the mass removed.  Addressed pt's concerns about her.  Pt states she has been in a very bad mood for a couple of weeks.  Pt has felt very irritable.  She is not sure why she is feeling this way.  She and [name] have had some disagreements.    Pt talked about the Thanksgiving holiday and the stress of having to make decisions about spending time with pt's family vs Mark's family.  Pt's kids really wanted to have a special Thanksgiving with pt and [name] got upset about it.   Helped pt process her feelings and family dynamics.   Pt talked about work.  She feels very stressed by being so overworked.  Helped pt process her feelings and work dynamics.  Worked on stress management strategies. Pt talked about her  relationship with [name].  She feels so much more irritated with him.  Addressed the arguments they have had.  Helped pt process her feelings and relationship dynamics.     Provided supportive counseling.   Interventions: Cognitive Behavioral Therapy and Insight-Oriented  Diagnosis: F43.21  Plan: See pt's Treatment Plan for depression in Therapy Charts.  (Treatment Plan Target Date: 03/22/2022) Pt is progressing with treatment goals.   Plan to continue to see pt monthly.    Krishav Mamone, LCSW

## 2021-11-23 ENCOUNTER — Ambulatory Visit: Payer: BC Managed Care – PPO | Admitting: Psychology

## 2021-11-23 DIAGNOSIS — F4321 Adjustment disorder with depressed mood: Secondary | ICD-10-CM | POA: Diagnosis not present

## 2021-11-23 NOTE — Progress Notes (Signed)
[facility] Counselor/Therapist Progress Note  Patient ID: Adriana Lopez, MRN: [id number],    Date: 11/23/2021  Time Spent: 4:00pm-5:00pm    60 minutes   Treatment Type: Individual Therapy  Reported Symptoms: sadness, irritability  Mental Status Exam: Appearance:  Casual     Behavior: Appropriate  Motor: Normal  Speech/Language:  Normal Rate  Affect: Appropriate  Mood: normal  Thought process: normal  Thought content:   WNL  Sensory/Perceptual disturbances:   WNL  Orientation: oriented to person, place, time/date, and situation  Attention: Good  Concentration: Good  Memory: WNL  Fund of knowledge:  Good  Insight:   Good  Judgment:  Good  Impulse Control: Good   Risk Assessment: Danger to Self:  No Self-injurious Behavior: No Danger to Others: No Duty to Warn:no Physical Aggression / Violence:No  Access to Firearms a concern: No  Gang Involvement:No   Subjective:  Pt present for individual face-to-face therapy via Webex.   Pt consented to telehealth video session due to Adriana Lopez pandemic.   Location of pt: home Location of therapist: home office. Pt states she has not been doing well.  She has been upset bc Adriana Lopez who has been staying with pt lost his job and his car stopped working.  He was upset and started to make poor choices.   Adriana Lopez got caught robbing someone and being in possession of a gun which violates his parole.  He is now in jail.   Pt is very upset and worried about Adriana Lopez.    Pt talked about her relationship with Adriana Lopez. She talked with Adriana Lopez about how much Adriana Lopez and his daughter Adriana Lopez mean to her.  She wants to help Adriana Lopez as much as she can.  Adriana Lopez was supportive of pt.  She felt grateful of his support especially since Adriana Lopez gets on his nerves.  Helped pt process her feelings and relationship dynamics.     Adriana Lopez has had some issues with his son who had to be hospitalized in a behavioral health hospital.  His behavior has been very  irratic and concerning.  Pt was concerned that Adriana Lopez would want his son to live with them but Adriana Lopez is not pursuing that.   Addressed the issues and pt's concerns.   Provided supportive counseling.  Pt talked about work.  She feels very stressed by being so overworked.  She is working on setting boundaries.  Helped pt process her feelings and work dynamics.  Worked on self care strategies.  Pt has started to play pickle ball and this has been a good activity for her.   Pt continues to take her walks regularly. Worked on stress management strategies. Provided supportive therapy.    Interventions: Cognitive Behavioral Therapy and Insight-Oriented  Diagnosis: F43.21  Plan: See pt's Treatment Plan for depression in Therapy Charts.  (Treatment Plan Target Date: 03/22/2022) Pt is progressing toward treatment goals.   Plan to continue to see pt monthly.    Denyce Harr, LCSW

## 2021-12-18 ENCOUNTER — Ambulatory Visit: Payer: BC Managed Care – PPO | Admitting: Psychology

## 2021-12-18 DIAGNOSIS — F4321 Adjustment disorder with depressed mood: Secondary | ICD-10-CM | POA: Diagnosis not present

## 2021-12-18 NOTE — Progress Notes (Signed)
[facility] Counselor/Therapist Progress Note  Patient ID: Adriana Lopez, MRN: [id number],    Date: 12/18/2021  Time Spent: 4:15pm-5:00pm    45 minutes   Treatment Type: Individual Therapy  Reported Symptoms: sadness, irritability  Mental Status Exam: Appearance:  Casual     Behavior: Appropriate  Motor: Normal  Speech/Language:  Normal Rate  Affect: Appropriate  Mood: normal  Thought process: normal  Thought content:   WNL  Sensory/Perceptual disturbances:   WNL  Orientation: oriented to person, place, time/date, and situation  Attention: Good  Concentration: Good  Memory: WNL  Fund of knowledge:  Good  Insight:   Good  Judgment:  Good  Impulse Control: Good   Risk Assessment: Danger to Self:  No Self-injurious Behavior: No Danger to Others: No Duty to Warn:no Physical Aggression / Violence:No  Access to Firearms a concern: No  Gang Involvement:No   Subjective:  Pt present for individual face-to-face therapy via Webex.   Pt consented to telehealth video session due to [place] 19 pandemic.   Location of pt: home Location of therapist: home office. Pt talked about getting COVID.  She was out of work for 4 days but worked some from home.   Pt's car died and she had to get a new used car.   Pt was anxious about it but [name] helped her and she got an SUV. Pt talked about [name] losing his job last week.  Pt is worried about it and thinks it will be hard for him to find another job.  Addressed pt's worries and how she and [name] are handling his unemployment.   Pt talked about work.  She feels very stressed by being so overworked.  She is working on setting boundaries.  Helped pt process her feelings and work dynamics.  Pt talked about helping her daughter Thurmond Butts buy a hemp store business for $200,000.   Pt has to put up her home as collateral so she is anxious about that.   Addressed pt's anxiety.  Worked on self care strategies.  Pt has started to play pickle  ball and this has been a good activity for her.   Pt continues to take her walks regularly. Worked on stress management strategies. Provided supportive therapy.    Interventions: Cognitive Behavioral Therapy and Insight-Oriented  Diagnosis: F43.21  Plan: See pt's Treatment Plan for depression in Therapy Charts.  (Treatment Plan Target Date: 03/22/2022) Pt is progressing toward treatment goals.   Plan to continue to see pt monthly.    Lacora Folmer, LCSW

## 2022-01-01 ENCOUNTER — Other Ambulatory Visit: Payer: Self-pay | Admitting: Family Medicine

## 2022-01-16 ENCOUNTER — Ambulatory Visit (INDEPENDENT_AMBULATORY_CARE_PROVIDER_SITE_OTHER): Payer: BC Managed Care – PPO | Admitting: Psychology

## 2022-01-16 DIAGNOSIS — F4321 Adjustment disorder with depressed mood: Secondary | ICD-10-CM

## 2022-01-16 NOTE — Progress Notes (Signed)
[facility] Counselor/Therapist Progress Note ? ?Patient ID: Adriana Lopez, MRN: [id number],   ? ?Date: 01/16/2022 ? ?Time Spent: 4:00pm-4:50pm    50 minutes  ? ?Treatment Type: Individual Therapy ? ?Reported Symptoms: sadness, irritability ? ?Mental Status Exam: ?Appearance:  Casual     ?Behavior: Appropriate  ?Motor: Normal  ?Speech/Language:  Normal Rate  ?Affect: Appropriate  ?Mood: normal  ?Thought process: normal  ?Thought content:   WNL  ?Sensory/Perceptual disturbances:   WNL  ?Orientation: oriented to person, place, time/date, and situation  ?Attention: Good  ?Concentration: Good  ?Memory: WNL  ?Fund of knowledge:  Good  ?Insight:   Good  ?Judgment:  Good  ?Impulse Control: Good  ? ?Risk Assessment: ?Danger to Self:  No ?Self-injurious Behavior: No ?Danger to Others: No ?Duty to Warn:no ?Physical Aggression / Violence:No  ?Access to Firearms a concern: No  ?Gang Involvement:No  ? ?Subjective:  ?Pt present for individual face-to-face therapy via Webex.   Pt consented to telehealth video session due to [place] 19 pandemic.   ?Location of pt: home ?Location of therapist: home office. ?Pt talked about having a bad day at work and feeling very overwhelmed.    Work was extra stressful today.   Addressed the issues. ?Helped pt process her feelings and work dynamics.   Worked on stress management strategies. ?[name] is still unemployed which is worrisome for pt.  Pt is worried it will be hard for him to find another job.  Addressed pt's worries and how she and [name] are handling his unemployment.   Pt misses having time to herself.   [name] is home all the time since he lost his job.   Pt needs her time alone.  She also likes to have time with [place] and [name] does not get along with [place].    Helped pt process her feelings and relationship dynamics.   ?Pt is going to go to [place] to visit her friend for Spring break.   Pt needs the time away.   ?Worked on self care strategies.  ?Provided supportive  therapy.   ? ?Interventions: Cognitive Behavioral Therapy and Insight-Oriented ? ?Diagnosis: F43.21 ? ?Plan: See pt's Treatment Plan for depression in Therapy Charts.  (Treatment Plan Target Date: 03/22/2022) ?Pt is progressing toward treatment goals.   ?Plan to continue to see pt monthly.   ? ?Burman Bruington, LCSW ? ? ?

## 2022-01-27 ENCOUNTER — Other Ambulatory Visit: Payer: Self-pay | Admitting: Family Medicine

## 2022-02-11 ENCOUNTER — Other Ambulatory Visit: Payer: Self-pay | Admitting: Family Medicine

## 2022-02-14 ENCOUNTER — Ambulatory Visit (INDEPENDENT_AMBULATORY_CARE_PROVIDER_SITE_OTHER): Payer: BC Managed Care – PPO | Admitting: Psychology

## 2022-02-14 DIAGNOSIS — F4321 Adjustment disorder with depressed mood: Secondary | ICD-10-CM

## 2022-02-14 NOTE — Progress Notes (Signed)
[facility] Counselor/Therapist Progress Note ? ?Patient ID: Adriana Lopez, MRN: [id number],   ? ?Date: 02/14/2022 ? ?Time Spent: 4:00pm-4:50pm    50 minutes  ? ?Treatment Type: Individual Therapy ? ?Reported Symptoms: stress ? ?Mental Status Exam: ?Appearance:  Casual     ?Behavior: Appropriate  ?Motor: Normal  ?Speech/Language:  Normal Rate  ?Affect: Appropriate  ?Mood: normal  ?Thought process: normal  ?Thought content:   WNL  ?Sensory/Perceptual disturbances:   WNL  ?Orientation: oriented to person, place, time/date, and situation  ?Attention: Good  ?Concentration: Good  ?Memory: WNL  ?Fund of knowledge:  Good  ?Insight:   Good  ?Judgment:  Good  ?Impulse Control: Good  ? ?Risk Assessment: ?Danger to Self:  No ?Self-injurious Behavior: No ?Danger to Others: No ?Duty to Warn:no ?Physical Aggression / Violence:No  ?Access to Firearms a concern: No  ?Gang Involvement:No  ? ?Subjective:  ?Pt present for individual face-to-face therapy via Webex.   Pt consented to telehealth video session due to [place] 19 pandemic.   ?Location of pt: home ?Location of therapist: home office. ?Pt talked about work.  She continues to have a lot of stress at work and has to work a lot of extra hours.   Addressed the issues. ?Helped pt process her feelings and work dynamics.   Worked on stress management strategies. ?Pt has interviewed for a job at [employer] and they are very interested in her.  She is hoping that she will hear soon about an offer from them. ?[name] is still unemployed which is worrisome for pt.  Pt is worried it will be hard for him to find another job.  Addressed pt's worries and how she and [name] are handling his unemployment.        Helped pt process her feelings and relationship dynamics.    ?Worked on self care strategies.  ?Provided supportive therapy.   ? ?Interventions: Cognitive Behavioral Therapy and Insight-Oriented ? ?Diagnosis: F43.21 ? ?Plan: See pt's Treatment Plan for depression in  Therapy Charts.  (Treatment Plan Target Date: 03/22/2022) ?Pt is progressing toward treatment goals.   ?Plan to continue to see pt monthly.   ? ?Hildagard Sobecki, LCSW ? ? ? ?

## 2022-03-11 ENCOUNTER — Ambulatory Visit: Payer: BC Managed Care – PPO | Admitting: Psychology

## 2022-03-11 DIAGNOSIS — F4321 Adjustment disorder with depressed mood: Secondary | ICD-10-CM

## 2022-03-11 NOTE — Progress Notes (Signed)
[facility] Counselor/Therapist Progress Note ? ?Patient ID: Adriana Lopez, MRN: [id number],   ? ?Date: 03/11/2022 ? ?Time Spent: 5:00pm-5:55pm    55 minutes  ? ?Treatment Type: Individual Therapy ? ?Reported Symptoms: stress ? ?Mental Status Exam: ?Appearance:  Casual     ?Behavior: Appropriate  ?Motor: Normal  ?Speech/Language:  Normal Rate  ?Affect: Appropriate  ?Mood: normal  ?Thought process: normal  ?Thought content:   WNL  ?Sensory/Perceptual disturbances:   WNL  ?Orientation: oriented to person, place, time/date, and situation  ?Attention: Good  ?Concentration: Good  ?Memory: WNL  ?Fund of knowledge:  Good  ?Insight:   Good  ?Judgment:  Good  ?Impulse Control: Good  ? ?Risk Assessment: ?Danger to Self:  No ?Self-injurious Behavior: No ?Danger to Others: No ?Duty to Warn:no ?Physical Aggression / Violence:No  ?Access to Firearms a concern: No  ?Gang Involvement:No  ? ?Subjective:  ?Pt present for individual face-to-face therapy via Webex.   Pt consented to telehealth video session due to [place] 19 pandemic.   ?Location of pt: home ?Location of therapist: home office. ?Pt talked about work.  She interviewed for a job at [employer] and she got the job.   Pt is very happy and relieved that she will be able to leave her current position.  Pt's current job continues to be very stressful.   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   ?Pt talked about her relationship with [name].  His son stayed a few days with them and it went ok.  Mark's son and daughter live together in their aunt's rental property.  They are not getting along well.  Mark's son wanted to stay with pt and [name] since he was not getting along with his sister.  Pt did not want Mark's son to move in but he did.   Pt is feeling resentful.    Pt and [name] got in an argument and pt is upset that [name] let his son move in for a couple of weeks without discussing it with pt.  Pt is questioning if their  relationship will last.  Addressed how pt can communicate her feelings to [facility].  Helped pt process her feelings and relationship dynamics.   ?Worked on self care strategies.  ?Provided supportive therapy.   ? ?Interventions: Cognitive Behavioral Therapy and Insight-Oriented ? ?Diagnosis: F43.21 ? ?Plan: See pt's Treatment Plan for depression in Therapy Charts.  (Treatment Plan Target Date: 03/22/2022) ?Pt is progressing toward treatment goals.   ?Plan to continue to see pt monthly.   ? ?Juanice Warburton, LCSW ? ? ? ?

## 2022-04-09 ENCOUNTER — Ambulatory Visit: Payer: BC Managed Care – PPO | Admitting: Psychology

## 2022-04-09 DIAGNOSIS — F4321 Adjustment disorder with depressed mood: Secondary | ICD-10-CM | POA: Diagnosis not present

## 2022-04-09 NOTE — Progress Notes (Signed)
[facility] Counselor Initial Adult Exam  Name: Adriana Lopez Date: 04/09/2022 MRN: [id number] DOB: Jan 27, 1961 PCP: Ann Held, DO  Time spent: 4:00pm - 4:55pm      55 minutes  Guardian/Payee:  n/a    Paperwork requested: No   Reason for Visit /Presenting Problem: Pt present for face-to-face initial assessment update via video Webex.  Pt consents to telehealth video session due to [place] 19 pandemic. Location of pt: home Location of therapist: home office.  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   She will be handing in her resignation this week.  She is looking forward to the summer off and then starting her new job at [employer] in the fall.   Pt talked about her relationship with [name] and his kids.  Addressed the stressors regarding dealing with Mark's son.   Reviewed pt's treatment plan for annual update.    Pt participated in setting treatment goals.   Plan to continue to meet monthly.    Mental Status Exam: Appearance:   Casual     Behavior:  Appropriate  Motor:  Normal  Speech/Language:   Normal Rate  Affect:  Appropriate  Mood:  normal  Thought process:  normal  Thought content:    WNL  Sensory/Perceptual disturbances:    WNL  Orientation:  oriented to person, place, time/date, and situation  Attention:  Good  Concentration:  Good  Memory:  WNL  Fund of knowledge:   Good  Insight:    Good  Judgment:   Good  Impulse Control:  Good     Reported Symptoms:  stress  Risk Assessment: Danger to Self:  No Self-injurious Behavior: No Danger to Others: No Duty to Warn:no Physical Aggression / Violence:No  Access to Firearms a concern: No  Gang Involvement:No  Patient / guardian was educated about steps to take if suicide or homicide risk level increases between visits: n/a While future psychiatric events cannot be accurately predicted, the patient does not currently require acute inpatient  psychiatric care and does not currently meet [facility] involuntary commitment criteria.  Substance Abuse History: Current substance abuse: No     Past Psychiatric History:   Previous psychological history is significant for anxiety and depression Outpatient Providers:Pt has been in therapy in the past.  She had anxiety and depression when in previous marriage and going through her divorce.    History of Psych Hospitalization: No  Psychological Testing:  n/a    Abuse History:  Victim of: No.,  n/a    Report needed: No. Victim of Neglect:No. Perpetrator of  n/a   Witness / Exposure to Domestic Violence: No   Protective Services Involvement: No  Witness to [employer] Violence:  No   Family History:  Family History  Problem Relation Age of Onset   Alzheimer's disease Mother    Heart disease Father        pacer   Cancer Father        dermatologic   Cancer Maternal Aunt        uterine   Alzheimer's disease Maternal Aunt    Diabetes Neg Hx    Stroke Neg Hx    Hypertension Neg Hx    Colon cancer Neg Hx    Throat cancer Neg Hx    Rectal cancer Neg Hx    Pancreatic cancer Neg Hx     Living situation: the patient lives with  their partner  Pt is youngest of five kids.  Father was a functional alcoholic.  No abuse . Parents were married but not a happy marriage.   Pt's older sister passed away.   Brother is recovering drug addict.  He has been a support for pt recently.  Pt has rekindled relationships with brother and sister and niece and nephew.   Parents have been deceased for several years.    Sexual Orientation: Straight  Relationship Status: divorced  Name of spouse / other:Mark If a parent, number of children / ages:pt has a young adult son and a young adult daughter   Support Systems: significant other friends  Financial Stress:  No   Income/Employment/Disability: Employment Pt is a Pharmacist, hospital.   Military Service: No   Educational History: Education:  Scientist, product/process development: [employer]  Any cultural differences that may affect / interfere with treatment:  not applicable   Recreation/Hobbies: pt likes to take walks and exercise.   Stressors: Marital or family conflict   Occupational concerns    Strengths: Supportive Relationships, Hopefulness, Self Advocate, and Able to Communicate Effectively  Barriers:  none   Legal History: Pending legal issue / charges: The patient has no significant history of legal issues. History of legal issue / charges:  n/a  Medical History/Surgical History: reviewed Past Medical History:  Diagnosis Date   Allergy    feline   Anxiety     Past Surgical History:  Procedure Laterality Date   REFRACTIVE SURGERY  2012   Dr Lucita Ferrara   SKIN CANCER EXCISION     right ear lobe   TONSILLECTOMY AND ADENOIDECTOMY      Medications: Current Outpatient Medications  Medication Sig Dispense Refill   acyclovir (ZOVIRAX) 200 MG capsule TAKE 1 CAPSULE (200 MG TOTAL) BY MOUTH 3 (THREE) TIMES DAILY. 270 capsule 1   ALPRAZolam (XANAX) 0.25 MG tablet Take 1 tablet (0.25 mg total) by mouth 2 (two) times daily as needed for anxiety. 20 tablet 0   azelastine (OPTIVAR) 0.05 % ophthalmic solution Place 1 drop into both eyes 2 (two) times daily. 6 mL 12   cetirizine (ZYRTEC) 10 MG tablet Take 10 mg by mouth daily.     clotrimazole-betamethasone (LOTRISONE) cream Apply 1 application on to the skin 2 (two) times daily. 30 g 0   doxycycline (VIBRA-TABS) 100 MG tablet Take 1 tablet (100 mg total) by mouth 2 (two) times daily. 20 tablet 0   methylPREDNISolone (MEDROL DOSEPAK) 4 MG TBPK tablet Take per package instructions, see back of pack 21 tablet 0   Nutritional Supplements (HRT SUPPORT PO) Take by mouth.     progesterone (PROMETRIUM) 100 MG capsule Take 100 mg by mouth at bedtime.     No current facility-administered medications for this visit.    Allergies  Allergen Reactions    Amoxicillin Diarrhea    Also had C Diff    Diagnoses:  F43.21  Plan of Care: Recommend ongoing therapy.  Pt participated in setting treatment goals.  Plan to continue to meet monthly.    Treatment Plan  (Treatment Plan Target Date:  04/10/2023) Client Abilities/Strengths  Pt is bright, engaging, and motivated for therapy.   Client Treatment Preferences  Individual therapy.  Client Statement of Needs  Improve coping skills.  Symptoms  Depressed or irritable mood.  Problems Addressed  Unipolar Depression Goals 1. Alleviate depressive symptoms and return to previous level of effective functioning. 2. Appropriately grieve the loss in order to normalize mood and  to return to previously adaptive level of functioning. Objective Learn and implement behavioral strategies to overcome depression. Target Date: 2023-04-10 Frequency: Monthly  Progress: 50 Modality: individual  Related Interventions Engage the client in "behavioral activation," increasing his/her activity level and contact with sources of reward, while identifying processes that inhibit activation.  Use behavioral techniques such as instruction, rehearsal, role-playing, role reversal, as needed, to facilitate activity in the client's daily life; reinforce success. Assist the client in developing skills that increase the likelihood of deriving pleasure from behavioral activation (e.g., assertiveness skills, developing an exercise plan, less internal/more external focus, increased social involvement); reinforce success. Objective Identify important people in life, past and present, and describe the quality, good and poor, of those relationships. Target Date: 2023-04-10 Frequency: Monthly  Progress: 50 Modality: individual  Related Interventions Conduct Interpersonal Therapy beginning with the assessment of the client's "interpersonal inventory" of important past and present relationships; develop a case formulation linking  depression to grief, interpersonal role disputes, role transitions, and/or interpersonal deficits). Objective Learn and implement problem-solving and decision-making skills. Target Date: 2023-04-10 Frequency: Monthly  Progress: 50 Modality: individual  Related Interventions Conduct Problem-Solving Therapy using techniques such as psychoeducation, modeling, and role-playing to teach client problem-solving skills (i.e., defining a problem specifically, generating possible solutions, evaluating the pros and cons of each solution, selecting and implementing a plan of action, evaluating the efficacy of the plan, accepting or revising the plan); role-play application of the problem-solving skill to a real life issue. Encourage in the client the development of a positive problem orientation in which problems and solving them are viewed as a natural part of life and not something to be feared, despaired, or avoided. 3. Develop healthy interpersonal relationships that lead to the alleviation and help prevent the relapse of depression. 4. Develop healthy thinking patterns and beliefs about self, others, and the world that lead to the alleviation and help prevent the relapse of depression. 5. Recognize, accept, and cope with feelings of depression. Diagnosis F43.21  Conditions For Discharge Achievement of treatment goals and objectives  Clint Bolder, LCSW

## 2022-04-26 ENCOUNTER — Encounter: Payer: Self-pay | Admitting: Family Medicine

## 2022-04-26 ENCOUNTER — Ambulatory Visit (INDEPENDENT_AMBULATORY_CARE_PROVIDER_SITE_OTHER): Payer: BC Managed Care – PPO | Admitting: Family Medicine

## 2022-04-26 VITALS — BP 120/68 | HR 61 | Temp 97.9°F | Resp 16 | Ht 64.0 in | Wt 119.6 lb

## 2022-04-26 DIAGNOSIS — Z0000 Encounter for general adult medical examination without abnormal findings: Secondary | ICD-10-CM

## 2022-04-26 DIAGNOSIS — Z23 Encounter for immunization: Secondary | ICD-10-CM

## 2022-04-26 DIAGNOSIS — M47812 Spondylosis without myelopathy or radiculopathy, cervical region: Secondary | ICD-10-CM

## 2022-04-26 DIAGNOSIS — F419 Anxiety disorder, unspecified: Secondary | ICD-10-CM

## 2022-04-26 MED ORDER — ALPRAZOLAM 0.25 MG PO TABS: 0.2500 mg | ORAL_TABLET | Freq: Two times a day (BID) | ORAL | 0 refills | Status: DC | PRN

## 2022-04-26 MED ORDER — DICLOFENAC POTASSIUM 50 MG PO TABS: 50.0000 mg | ORAL_TABLET | Freq: Three times a day (TID) | ORAL | 3 refills | Status: AC

## 2022-04-26 NOTE — Progress Notes (Signed)
Subjective:     Adriana Lopez is a 61 y.o. female and is here for a comprehensive physical exam. The patient reports  no new problems -----  [employer] helping,   .   No other complaints   Social History   Socioeconomic History   Marital status: Divorced    Spouse name: Not on file   Number of children: 2   Years of education: Not on file   Highest education level: Not on file  Occupational History    Employer: [employer]  Tobacco Use   Smoking status: Former    Types: Cigarettes   Smokeless tobacco: Never  Vaping Use   Vaping Use: Never used  Substance and Sexual Activity   Alcohol use: Yes    Alcohol/week: 7.0 standard drinks of alcohol    Types: 7 Glasses of wine per week   Drug use: No   Sexual activity: Yes  Other Topics Concern   Not on file  Social History Narrative   Exercise- walking   Social Determinants of Health   Financial Resource Strain: Not on file  Food Insecurity: Not on file  Transportation Needs: Not on file  Physical Activity: Not on file  Stress: Not on file  Social Connections: Not on file  Intimate Partner Violence: Not on file   Health Maintenance  Topic Date Due   Fecal DNA (Cologuard)  Never done   MAMMOGRAM  05/26/2022 (Originally 06/22/2021)   PAP SMEAR-Modifier  05/26/2022 (Originally 08/28/2016)   INFLUENZA VACCINE  05/28/2022   TETANUS/TDAP  04/26/2032   Hepatitis C Screening  Completed   HIV Screening  Completed   Zoster Vaccines- Shingrix  Completed   HPV VACCINES  Aged Out   COVID-19 Vaccine  Discontinued    The following portions of the patient's history were reviewed and updated as appropriate: She  has a past medical history of Allergy and Anxiety. She does not have any pertinent problems on file. She  has a past surgical history that includes Tonsillectomy and adenoidectomy; Refractive surgery (2012); and Skin cancer excision. Her family history includes Alzheimer's disease in her maternal aunt and mother;  Cancer in her father and maternal aunt; Heart disease in her father. She  reports that she has quit smoking. Her smoking use included cigarettes. She has never used smokeless tobacco. She reports current alcohol use of about 7.0 standard drinks of alcohol per week. She reports that she does not use drugs. She has a current medication list which includes the following prescription(s): acyclovir, cetirizine, clotrimazole-betamethasone, diclofenac, estradiol, nutritional supplements, progesterone, and alprazolam. Current Outpatient Medications on File Prior to Visit  Medication Sig Dispense Refill   acyclovir (ZOVIRAX) 200 MG capsule TAKE 1 CAPSULE (200 MG TOTAL) BY MOUTH 3 (THREE) TIMES DAILY. 270 capsule 1   cetirizine (ZYRTEC) 10 MG tablet Take 10 mg by mouth daily.     clotrimazole-betamethasone (LOTRISONE) cream Apply 1 application on to the skin 2 (two) times daily. 30 g 0   estradiol (VIVELLE-DOT) 0.05 MG/24HR patch 1 patch 2 (two) times a week.     Nutritional Supplements (HRT SUPPORT PO) Take by mouth.     progesterone (PROMETRIUM) 100 MG capsule Take 100 mg by mouth at bedtime.     No current facility-administered medications on file prior to visit.   She is allergic to amoxicillin..  Review of Systems Review of Systems  Constitutional: Negative for activity change, appetite change and fatigue.  HENT: Negative for hearing loss, congestion, tinnitus and  ear discharge.  dentist q2mEyes: Negative for visual disturbance (see optho q1y -- vision corrected to 20/20 with glasses).  Respiratory: Negative for cough, chest tightness and shortness of breath.   Cardiovascular: Negative for chest pain, palpitations and leg swelling.  Gastrointestinal: Negative for abdominal pain, diarrhea, constipation and abdominal distention.  Genitourinary: Negative for urgency, frequency, decreased urine volume and difficulty urinating.  Musculoskeletal: Negative for back pain, arthralgias and gait problem.   Skin: Negative for color change, pallor and rash.  Neurological: Negative for dizziness, light-headedness, numbness and headaches.  Hematological: Negative for adenopathy. Does not bruise/bleed easily.  Psychiatric/Behavioral: Negative for suicidal ideas, confusion, sleep disturbance, self-injury, dysphoric mood, decreased concentration and agitation.      Objective:    BP 120/68 (BP Location: Right Arm, Patient Position: Sitting, Cuff Size: Normal)   Pulse 61   Temp 97.9 F (36.6 C) (Oral)   Resp 16   Ht '5\' 4"'$  (1.626 m)   Wt 119 lb 9.6 oz (54.3 kg)   SpO2 98%   BMI 20.53 kg/m  General appearance: alert, cooperative, appears stated age, and no distress Head: Normocephalic, without obvious abnormality, atraumatic Eyes: conjunctivae/corneas clear. PERRL, EOM's intact. Fundi benign. Ears: normal TM's and external ear canals both ears Nose: Nares normal. Septum midline. Mucosa normal. No drainage or sinus tenderness. Throat: lips, mucosa, and tongue normal; teeth and gums normal Neck: no adenopathy, no carotid bruit, no JVD, supple, symmetrical, trachea midline, and thyroid not enlarged, symmetric, no tenderness/mass/nodules Back: symmetric, no curvature. ROM normal. No CVA tenderness. Lungs: clear to auscultation bilaterally Heart: regular rate and rhythm, S1, S2 normal, no murmur, click, rub or gallop Abdomen: soft, non-tender; bowel sounds normal; no masses,  no organomegaly Extremities: extremities normal, atraumatic, no cyanosis or edema Pulses: 2+ and symmetric Skin: Skin color, texture, turgor normal. No rashes or lesions Lymph nodes: Cervical, supraclavicular, and axillary nodes normal. Neurologic: Alert and oriented X 3, normal strength and tone. Normal symmetric reflexes. Normal coordination and gait    Assessment:    Healthy female exam.      Plan:    Ghm utd Check labs  See After Visit Summary for Counseling Recommendations   1. Need for Tdap  vaccination   - Tdap vaccine greater than or equal to 7yo IM  2. Osteoarthritis of cervical spine, unspecified spinal osteoarthritis complication status stable - diclofenac (CATAFLAM) 50 MG tablet; Take 1 tablet (50 mg total) by mouth 3 (three) times daily.  Dispense: 90 tablet; Refill: 3  3. Anxiety Mostly a problem with flying/ driving  - ALPRAZolam (XANAX) 0.25 MG tablet; Take 1 tablet (0.25 mg total) by mouth 2 (two) times daily as needed for anxiety.  Dispense: 20 tablet; Refill: 0  4. Preventative health care Ghm utd Check labs  - CBC with Differential/Platelet - Comprehensive metabolic panel - Lipid panel - TSH

## 2022-04-26 NOTE — Patient Instructions (Signed)
Preventive Care 40-62 Years Old, Female Preventive care refers to lifestyle choices and visits with your health care provider that can promote health and wellness. Preventive care visits are also called wellness exams. What can I expect for my preventive care visit? Counseling Your health care provider may ask you questions about your: Medical history, including: Past medical problems. Family medical history. Pregnancy history. Current health, including: Menstrual cycle. Method of birth control. Emotional well-being. Home life and relationship well-being. Sexual activity and sexual health. Lifestyle, including: Alcohol, nicotine or tobacco, and drug use. Access to firearms. Diet, exercise, and sleep habits. Work and work environment. Sunscreen use. Safety issues such as seatbelt and bike helmet use. Physical exam Your health care provider will check your: Height and weight. These may be used to calculate your BMI (body mass index). BMI is a measurement that tells if you are at a healthy weight. Waist circumference. This measures the distance around your waistline. This measurement also tells if you are at a healthy weight and may help predict your risk of certain diseases, such as type 2 diabetes and high blood pressure. Heart rate and blood pressure. Body temperature. Skin for abnormal spots. What immunizations do I need?  Vaccines are usually given at various ages, according to a schedule. Your health care provider will recommend vaccines for you based on your age, medical history, and lifestyle or other factors, such as travel or where you work. What tests do I need? Screening Your health care provider may recommend screening tests for certain conditions. This may include: Lipid and cholesterol levels. Diabetes screening. This is done by checking your blood sugar (glucose) after you have not eaten for a while (fasting). Pelvic exam and Pap test. Hepatitis B test. Hepatitis C  test. HIV (human immunodeficiency virus) test. STI (sexually transmitted infection) testing, if you are at risk. Lung cancer screening. Colorectal cancer screening. Mammogram. Talk with your health care provider about when you should start having regular mammograms. This may depend on whether you have a family history of breast cancer. BRCA-related cancer screening. This may be done if you have a family history of breast, ovarian, tubal, or peritoneal cancers. Bone density scan. This is done to screen for osteoporosis. Talk with your health care provider about your test results, treatment options, and if necessary, the need for more tests. Follow these instructions at home: Eating and drinking  Eat a diet that includes fresh fruits and vegetables, whole grains, lean protein, and low-fat dairy products. Take vitamin and mineral supplements as recommended by your health care provider. Do not drink alcohol if: Your health care provider tells you not to drink. You are pregnant, may be pregnant, or are planning to become pregnant. If you drink alcohol: Limit how much you have to 0-1 drink a day. Know how much alcohol is in your drink. In the [place] one drink equals one 12 oz bottle of beer (355 mL), one 5 oz glass of wine (148 mL), or one 1 oz glass of hard liquor (44 mL). Lifestyle Brush your teeth every morning and night with fluoride toothpaste. Floss one time each day. Exercise for at least 30 minutes 5 or more days each week. Do not use any products that contain nicotine or tobacco. These products include cigarettes, chewing tobacco, and vaping devices, such as e-cigarettes. If you need help quitting, ask your health care provider. Do not use drugs. If you are sexually active, practice safe sex. Use a condom or other form of protection to   prevent STIs. If you do not wish to become pregnant, use a form of birth control. If you plan to become pregnant, see your health care provider for a  prepregnancy visit. Take aspirin only as told by your health care provider. Make sure that you understand how much to take and what form to take. Work with your health care provider to find out whether it is safe and beneficial for you to take aspirin daily. Find healthy ways to manage stress, such as: Meditation, yoga, or listening to music. Journaling. Talking to a trusted person. Spending time with friends and family. Minimize exposure to UV radiation to reduce your risk of skin cancer. Safety Always wear your seat belt while driving or riding in a vehicle. Do not drive: If you have been drinking alcohol. Do not ride with someone who has been drinking. When you are tired or distracted. While texting. If you have been using any mind-altering substances or drugs. Wear a helmet and other protective equipment during sports activities. If you have firearms in your house, make sure you follow all gun safety procedures. Seek help if you have been physically or sexually abused. What's next? Visit your health care provider once a year for an annual wellness visit. Ask your health care provider how often you should have your eyes and teeth checked. Stay up to date on all vaccines. This information is not intended to replace advice given to you by your health care provider. Make sure you discuss any questions you have with your health care provider. Document Revised: 04/11/2021 Document Reviewed: 04/11/2021 Elsevier Patient Education  [employer].

## 2022-04-27 LAB — COMPREHENSIVE METABOLIC PANEL
AG Ratio: 2.1 (calc) (ref 1.0–2.5)
ALT: 8 U/L (ref 6–29)
AST: 17 U/L (ref 10–35)
Albumin: 4.4 g/dL (ref 3.6–5.1)
Alkaline phosphatase (APISO): 48 U/L (ref 37–153)
BUN: 18 mg/dL (ref 7–25)
CO2: 25 mmol/L (ref 20–32)
Calcium: 9 mg/dL (ref 8.6–10.4)
Chloride: 102 mmol/L (ref 98–110)
Creat: 0.68 mg/dL (ref 0.50–1.05)
Globulin: 2.1 g/dL (calc) (ref 1.9–3.7)
Glucose, Bld: 82 mg/dL (ref 65–99)
Potassium: 4 mmol/L (ref 3.5–5.3)
Sodium: 138 mmol/L (ref 135–146)
Total Bilirubin: 0.9 mg/dL (ref 0.2–1.2)
Total Protein: 6.5 g/dL (ref 6.1–8.1)

## 2022-04-27 LAB — LIPID PANEL
Cholesterol: 203 mg/dL — ABNORMAL HIGH (ref <200)
HDL: 116 mg/dL (ref >=50)
LDL Cholesterol (Calc): 76 mg/dL (calc)
Non-HDL Cholesterol (Calc): 87 mg/dL (calc) (ref <130)
Total CHOL/HDL Ratio: 1.8 (calc) (ref <5.0)
Triglycerides: 39 mg/dL (ref <150)

## 2022-04-27 LAB — CBC WITH DIFFERENTIAL/PLATELET
Absolute Monocytes: 593 cells/uL (ref 200–950)
Basophils Absolute: 38 cells/uL (ref 0–200)
Basophils Relative: 0.5 %
Eosinophils Absolute: 120 cells/uL (ref 15–500)
Eosinophils Relative: 1.6 %
HCT: 39.5 % (ref 35.0–45.0)
Hemoglobin: 13.4 g/dL (ref 11.7–15.5)
Lymphs Abs: 2903 cells/uL (ref 850–3900)
MCH: 33.6 pg — ABNORMAL HIGH (ref 27.0–33.0)
MCHC: 33.9 g/dL (ref 32.0–36.0)
MCV: 99 fL (ref 80.0–100.0)
MPV: 10.3 fL (ref 7.5–12.5)
Monocytes Relative: 7.9 %
Neutro Abs: 3848 cells/uL (ref 1500–7800)
Neutrophils Relative %: 51.3 %
Platelets: 337 10*3/uL (ref 140–400)
RBC: 3.99 10*6/uL (ref 3.80–5.10)
RDW: 11.8 % (ref 11.0–15.0)
Total Lymphocyte: 38.7 %
WBC: 7.5 10*3/uL (ref 3.8–10.8)

## 2022-04-27 LAB — TSH: TSH: 2 mIU/L (ref 0.40–4.50)

## 2022-05-06 ENCOUNTER — Ambulatory Visit: Payer: BC Managed Care – PPO | Admitting: Psychology

## 2022-05-06 DIAGNOSIS — F4321 Adjustment disorder with depressed mood: Secondary | ICD-10-CM

## 2022-05-06 NOTE — Progress Notes (Signed)
[facility] Counselor/Therapist Progress Note  Patient ID: Mitzie Marlar, MRN: [id number],    Date: 05/06/2022  Time Spent: 4:00pm-4:55pm    55 minutes   Treatment Type: Individual Therapy  Reported Symptoms: stress  Mental Status Exam: Appearance:  Casual     Behavior: Appropriate  Motor: Normal  Speech/Language:  Normal Rate  Affect: Appropriate  Mood: normal  Thought process: normal  Thought content:   WNL  Sensory/Perceptual disturbances:   WNL  Orientation: oriented to person, place, time/date, and situation  Attention: Good  Concentration: Good  Memory: WNL  Fund of knowledge:  Good  Insight:   Good  Judgment:  Good  Impulse Control: Good   Risk Assessment: Danger to Self:  No Self-injurious Behavior: No Danger to Others: No Duty to Warn:no Physical Aggression / Violence:No  Access to Firearms a concern: No  Gang Involvement:No   Subjective: Pt present for face-to-face individual therapy via video Webex.  Pt consents to telehealth video session due to COVID 19 pandemic. Location of pt: home Location of therapist: home office.  Pt talked about meeting her team from her new job today.   Pt had lunch with the team.  Pt likes her new team.  She starts working there August 7th.   Pt talked about her trip to [place].  She stayed with her friend Santiago Glad and had a nice relaxing time.  Santiago Glad has a lot of money and paid for a new car for pt.   Pt has been concerned about how this may affect their friendship.  Addressed the friendship dynamics. Pt talked about her relationship with [name].  She is feeling very happy with him and can see herself marrying him.   Provided supportive therapy.   Interventions: Cognitive Behavioral Therapy and Insight-Oriented  Diagnosis:Adjustment disorder with depressed mood  Plan of Care: Recommend ongoing therapy.  Pt participated in setting treatment goals.  Plan to continue to meet monthly.    Treatment Plan  (Treatment  Plan Target Date:  04/10/2023) Client Abilities/Strengths  Pt is bright, engaging, and motivated for therapy.   Client Treatment Preferences  Individual therapy.  Client Statement of Needs  Improve coping skills.  Symptoms  Depressed or irritable mood.  Problems Addressed  Unipolar Depression Goals 1. Alleviate depressive symptoms and return to previous level of effective functioning. 2. Appropriately grieve the loss in order to normalize mood and to return to previously adaptive level of functioning. Objective Learn and implement behavioral strategies to overcome depression. Target Date: 2023-04-10 Frequency: Monthly  Progress: 50 Modality: individual  Related Interventions Engage the client in "behavioral activation," increasing his/her activity level and contact with sources of reward, while identifying processes that inhibit activation.  Use behavioral techniques such as instruction, rehearsal, role-playing, role reversal, as needed, to facilitate activity in the client's daily life; reinforce success. Assist the client in developing skills that increase the likelihood of deriving pleasure from behavioral activation (e.g., assertiveness skills, developing an exercise plan, less internal/more external focus, increased social involvement); reinforce success. Objective Identify important people in life, past and present, and describe the quality, good and poor, of those relationships. Target Date: 2023-04-10 Frequency: Monthly  Progress: 50 Modality: individual  Related Interventions Conduct Interpersonal Therapy beginning with the assessment of the client's "interpersonal inventory" of important past and present relationships; develop a case formulation linking depression to grief, interpersonal role disputes, role transitions, and/or interpersonal deficits). Objective Learn and implement problem-solving and decision-making skills. Target Date: 2023-04-10 Frequency: Monthly  Progress:  50 Modality: individual  Related Interventions Conduct Problem-Solving Therapy using techniques such as psychoeducation, modeling, and role-playing to teach client problem-solving skills (i.e., defining a problem specifically, generating possible solutions, evaluating the pros and cons of each solution, selecting and implementing a plan of action, evaluating the efficacy of the plan, accepting or revising the plan); role-play application of the problem-solving skill to a real life issue. Encourage in the client the development of a positive problem orientation in which problems and solving them are viewed as a natural part of life and not something to be feared, despaired, or avoided. 3. Develop healthy interpersonal relationships that lead to the alleviation and help prevent the relapse of depression. 4. Develop healthy thinking patterns and beliefs about self, others, and the world that lead to the alleviation and help prevent the relapse of depression. 5. Recognize, accept, and cope with feelings of depression. Diagnosis F43.21  Conditions For Discharge Achievement of treatment goals and objectives   Clint Bolder, LCSW

## 2022-05-28 ENCOUNTER — Ambulatory Visit: Payer: BC Managed Care – PPO | Admitting: Psychology

## 2022-05-28 DIAGNOSIS — F4321 Adjustment disorder with depressed mood: Secondary | ICD-10-CM

## 2022-05-28 NOTE — Progress Notes (Signed)
Adriana Lopez Lopez Progress Note  Patient ID: Adriana Lopez Lopez, MRN: [id number],    Date: 05/28/2022  Time Spent: 5:00pm-5:55pm    55 minutes   Treatment Type: Individual Therapy  Reported Symptoms: stress  Mental Status Exam: Appearance:  Casual     Behavior: Appropriate  Motor: Normal  Speech/Language:  Normal Rate  Affect: Appropriate  Mood: normal  Thought process: normal  Thought content:   WNL  Sensory/Perceptual disturbances:   WNL  Orientation: oriented to person, place, time/date, and situation  Attention: Good  Concentration: Good  Memory: WNL  Fund of knowledge:  Good  Insight:   Good  Judgment:  Good  Impulse Control: Good   Risk Assessment: Danger to Self:  No Self-injurious Behavior: No Danger to Others: No Duty to Warn:no Physical Aggression / Violence:No  Access to Firearms a concern: No  Gang Involvement:No   Subjective: Pt present for face-to-face individual therapy via video Webex.  Pt consents to telehealth video session due to COVID 19 pandemic. Location of pt: home Location of therapist: home office.  Pt talked about starting her new job on Monday.  She is sad that the summer is over but is so glad she has this new job instead of going back to her previous job.  Pt has some anticipatory anxiety about the new job.   Worked with pt on calming strategies. Pt talked about her relationship with Adriana Lopez Lopez.   She is going to ask him to pay the tax bill since he is working now.   She was hoping her would offer it but he didn't.   Addressed their relationship dynamics around money.   Pt talked about her concerns about her son Adriana Lopez Lopez.   He was offered a good new job but has to do a drug test and Adriana Lopez Lopez smokes marijuana.   Pt is concerned that Adriana Lopez Lopez will not pass the drug test.   Pt worries about Adriana Lopez Lopez.    Provided supportive therapy.   Interventions: Cognitive Behavioral Therapy and Insight-Oriented  Diagnosis:  F43.21  Plan of Care:  Recommend ongoing therapy.  Pt participated in setting treatment goals.  Plan to continue to meet monthly.    Treatment Plan  (Treatment Plan Target Date:  04/10/2023) Client Abilities/Strengths  Pt is bright, engaging, and motivated for therapy.   Client Treatment Preferences  Individual therapy.  Client Statement of Needs  Improve coping skills.  Symptoms  Depressed or irritable mood.  Problems Addressed  Unipolar Depression Goals 1. Alleviate depressive symptoms and return to previous level of effective functioning. 2. Appropriately grieve the loss in order to normalize mood and to return to previously adaptive level of functioning. Objective Learn and implement behavioral strategies to overcome depression. Target Date: 2023-04-10 Frequency: Monthly  Progress: 50 Modality: individual  Related Interventions Engage the client in "behavioral activation," increasing his/her activity level and contact with sources of reward, while identifying processes that inhibit activation.  Use behavioral techniques such as instruction, rehearsal, role-playing, role reversal, as needed, to facilitate activity in the client's daily life; reinforce success. Assist the client in developing skills that increase the likelihood of deriving pleasure from behavioral activation (e.g., assertiveness skills, developing an exercise plan, less internal/more external focus, increased social involvement); reinforce success. Objective Identify important people in life, past and present, and describe the quality, good and poor, of those relationships. Target Date: 2023-04-10 Frequency: Monthly  Progress: 50 Modality: individual  Related Interventions Conduct Interpersonal Therapy beginning with the assessment of the client's "interpersonal  inventory" of important past and present relationships; develop a case formulation linking depression to grief, interpersonal role disputes, role transitions, and/or interpersonal  deficits). Objective Learn and implement problem-solving and decision-making skills. Target Date: 2023-04-10 Frequency: Monthly  Progress: 50 Modality: individual  Related Interventions Conduct Problem-Solving Therapy using techniques such as psychoeducation, modeling, and role-playing to teach client problem-solving skills (i.e., defining a problem specifically, generating possible solutions, evaluating the pros and cons of each solution, selecting and implementing a plan of action, evaluating the efficacy of the plan, accepting or revising the plan); role-play application of the problem-solving skill to a real life issue. Encourage in the client the development of a positive problem orientation in which problems and solving them are viewed as a natural part of life and not something to be feared, despaired, or avoided. 3. Develop healthy interpersonal relationships that lead to the alleviation and help prevent the relapse of depression. 4. Develop healthy thinking patterns and beliefs about self, others, and the world that lead to the alleviation and help prevent the relapse of depression. 5. Recognize, accept, and cope with feelings of depression. Diagnosis F43.21  Conditions For Discharge Achievement of treatment goals and objectives   Clint Bolder, LCSW

## 2022-06-03 ENCOUNTER — Ambulatory Visit: Payer: BC Managed Care – PPO | Admitting: Psychology

## 2022-06-07 LAB — COLOGUARD: COLOGUARD: NEGATIVE

## 2022-06-25 ENCOUNTER — Encounter: Payer: Self-pay | Admitting: Family Medicine

## 2022-06-25 ENCOUNTER — Ambulatory Visit (INDEPENDENT_AMBULATORY_CARE_PROVIDER_SITE_OTHER): Payer: BC Managed Care – PPO | Admitting: Family Medicine

## 2022-06-25 VITALS — BP 104/60 | HR 70 | Temp 98.3°F | Ht 64.0 in | Wt 120.0 lb

## 2022-06-25 DIAGNOSIS — R2241 Localized swelling, mass and lump, right lower limb: Secondary | ICD-10-CM

## 2022-06-25 NOTE — Progress Notes (Addendum)
Established Patient Office Visit  Subjective   Patient ID: Adriana Lopez, female    DOB: [birth year]  Age: 61 y.o. MRN: [id number]  Chief Complaint  Patient presents with   right foot mass     Noticed it this summer.     HPI Pt is here c/o mass on top of her foot that is getting bigger.  No pain.  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   Basal cell carcinoma 08/18/2012   Acute frontal sinusitis 10/19/2010   Past Medical History:  Diagnosis Date   Allergy    feline   Anxiety    Past Surgical History:  Procedure Laterality Date   REFRACTIVE SURGERY  2012   Dr Lucita Ferrara   SKIN CANCER EXCISION     right ear lobe   TONSILLECTOMY AND ADENOIDECTOMY     Social History   Tobacco Use   Smoking status: Former    Types: Cigarettes   Smokeless tobacco: Never  Vaping Use   Vaping Use: Never used  Substance Use Topics   Alcohol use: Yes    Alcohol/week: 7.0 standard drinks of alcohol    Types: 7 Glasses of wine per week   Drug use: No   Social History   Socioeconomic History   Marital status: Divorced    Spouse name: Not on file   Number of children: 2   Years of education: Not on file   Highest education level: Not on file  Occupational History    Employer: [employer]  Tobacco Use   Smoking status: Former    Types: Cigarettes   Smokeless tobacco: Never  Vaping Use   Vaping Use: Never used  Substance and Sexual Activity   Alcohol use: Yes    Alcohol/week: 7.0 standard drinks of alcohol    Types: 7 Glasses of wine per week   Drug use: No   Sexual activity: Yes  Other Topics Concern   Not on file  Social History Narrative    Exercise- walking   Social Determinants of Health   Financial Resource Strain: Not on file  Food Insecurity: Not on file  Transportation Needs: Not on file  Physical Activity: Not on file  Stress: Not on file  Social Connections: Not on file  Intimate Partner Violence: Not on file   Family Status  Relation Name Status   Mother  (Not Specified)   Father  (Not Specified)   Mat Aunt  (Not Specified)   Mat Aunt  (Not Specified)   Neg Hx  (Not Specified)   Family History  Problem Relation Age of Onset   Alzheimer's disease Mother    Heart disease Father        pacer   Cancer Father        dermatologic   Cancer Maternal Aunt        uterine   Alzheimer's disease Maternal Aunt    Diabetes Neg Hx    Stroke Neg Hx    Hypertension Neg Hx    Colon cancer Neg Hx    Throat cancer Neg Hx    Rectal cancer Neg Hx    Pancreatic cancer Neg Hx  Allergies  Allergen Reactions   Amoxicillin Diarrhea    Also had C Diff      ROS    Objective:     BP 104/60   Pulse 70   Temp 98.3 F (36.8 C) (Oral)   Ht '5\' 4"'$  (1.626 m)   Wt 120 lb (54.4 kg)   SpO2 98%   BMI 20.60 kg/m  BP Readings from Last 3 Encounters:  06/25/22 104/60  04/26/22 120/68  08/17/21 117/60   Wt Readings from Last 3 Encounters:  06/25/22 120 lb (54.4 kg)  04/26/22 119 lb 9.6 oz (54.3 kg)  08/17/21 115 lb (52.2 kg)   SpO2 Readings from Last 3 Encounters:  06/25/22 98%  04/26/22 98%  08/17/21 99%      Physical Exam Vitals and nursing note reviewed.  Constitutional:      Appearance: Normal appearance.  Skin:    Findings: Lesion present.          Comments: Soft fatty mass on top R foot ---  about 1 1/2 in diameter  Non tender  No errythema  No signs infection   Neurological:     Mental Status: She is alert.     No results found for any visits on 06/25/22.    The ASCVD Risk score (Arnett DK, et al., 2019) failed to calculate for the following reasons:   The valid HDL cholesterol  range is 20 to 100 mg/dL    Assessment & Plan:   Problem List Items Addressed This Visit   None Visit Diagnoses     Mass of right foot    -  Primary   Relevant Orders   [place] RT LOWER EXTREM LTD SOFT TISSUE NON VASCULAR       No follow-ups on file.    Ann Held, DO

## 2022-07-02 ENCOUNTER — Ambulatory Visit (INDEPENDENT_AMBULATORY_CARE_PROVIDER_SITE_OTHER): Payer: 59 | Admitting: Psychology

## 2022-07-02 DIAGNOSIS — F4321 Adjustment disorder with depressed mood: Secondary | ICD-10-CM

## 2022-07-02 NOTE — Progress Notes (Signed)
[facility] Counselor/Therapist Progress Note  Patient ID: Tricia Pledger, MRN: [id number],    Date: 07/02/2022  Time Spent: 5:00pm-5:55pm    55 minutes   Treatment Type: Individual Therapy  Reported Symptoms: stress  Mental Status Exam: Appearance:  Casual     Behavior: Appropriate  Motor: Normal  Speech/Language:  Normal Rate  Affect: Appropriate  Mood: normal  Thought process: normal  Thought content:   WNL  Sensory/Perceptual disturbances:   WNL  Orientation: oriented to person, place, time/date, and situation  Attention: Good  Concentration: Good  Memory: WNL  Fund of knowledge:  Good  Insight:   Good  Judgment:  Good  Impulse Control: Good   Risk Assessment: Danger to Self:  No Self-injurious Behavior: No Danger to Others: No Duty to Warn:no Physical Aggression / Violence:No  Access to Firearms a concern: No  Gang Involvement:No   Subjective: Pt present for face-to-face individual therapy via video Webex.  Pt consents to telehealth video session due to COVID 19 pandemic. Location of pt: home Location of therapist: home office.  Pt talked about her new job.   Pt states it is very different from the job she had.   She is finding some things out about how bad a job the person she replaced did.   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  Addressed the challenges and concerns.  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   Pt talked about her relationship with [name].  They went out of town this past weekend and [place].  Pt still has not had the conversation with [name] about finances.  Addressed what pt's fears and concerns are regarding talking about money.  Helped pt process her feelings and worked on Network engineer.   Provided supportive therapy.   Interventions: Cognitive Behavioral Therapy and Insight-Oriented  Diagnosis:  F43.21  Plan of  Care: Recommend ongoing therapy.  Pt participated in setting treatment goals.  Plan to continue to meet monthly.    Treatment Plan  (Treatment Plan Target Date:  04/10/2023) Client Abilities/Strengths  Pt is bright, engaging, and motivated for therapy.   Client Treatment Preferences  Individual therapy.  Client Statement of Needs  Improve coping skills.  Symptoms  Depressed or irritable mood.  Problems Addressed  Unipolar Depression Goals 1. Alleviate depressive symptoms and return to previous level of effective functioning. 2. Appropriately grieve the loss in order to normalize mood and to return to previously adaptive level of functioning. Objective Learn and implement behavioral strategies to overcome depression. Target Date: 2023-04-10 Frequency: Monthly  Progress: 50 Modality: individual  Related Interventions Engage the client in "behavioral activation," increasing his/her activity level and contact with sources of reward, while identifying processes that inhibit activation.  Use behavioral techniques such as instruction, rehearsal, role-playing, role reversal, as needed, to facilitate activity in the client's daily life; reinforce success. Assist the client in developing skills that increase the likelihood of deriving pleasure from behavioral activation (e.g., assertiveness skills, developing an exercise plan, less internal/more external focus, increased social involvement); reinforce success. Objective Identify important people in life, past and present, and describe the quality, good and poor, of those relationships. Target Date: 2023-04-10 Frequency: Monthly  Progress: 50 Modality: individual  Related Interventions Conduct Interpersonal Therapy beginning with the assessment of the client's "interpersonal inventory" of important past and present relationships; develop a  case formulation linking depression to grief, interpersonal role disputes, role transitions, and/or  interpersonal deficits). Objective Learn and implement problem-solving and decision-making skills. Target Date: 2023-04-10 Frequency: Monthly  Progress: 50 Modality: individual  Related Interventions Conduct Problem-Solving Therapy using techniques such as psychoeducation, modeling, and role-playing to teach client problem-solving skills (i.e., defining a problem specifically, generating possible solutions, evaluating the pros and cons of each solution, selecting and implementing a plan of action, evaluating the efficacy of the plan, accepting or revising the plan); role-play application of the problem-solving skill to a real life issue. Encourage in the client the development of a positive problem orientation in which problems and solving them are viewed as a natural part of life and not something to be feared, despaired, or avoided. 3. Develop healthy interpersonal relationships that lead to the alleviation and help prevent the relapse of depression. 4. Develop healthy thinking patterns and beliefs about self, others, and the world that lead to the alleviation and help prevent the relapse of depression. 5. Recognize, accept, and cope with feelings of depression. Diagnosis F43.21  Conditions For Discharge Achievement of treatment goals and objectives   Clint Bolder, LCSW

## 2022-07-04 ENCOUNTER — Ambulatory Visit (HOSPITAL_BASED_OUTPATIENT_CLINIC_OR_DEPARTMENT_OTHER): Payer: BC Managed Care – PPO

## 2022-07-11 ENCOUNTER — Ambulatory Visit (HOSPITAL_BASED_OUTPATIENT_CLINIC_OR_DEPARTMENT_OTHER)
Admission: RE | Admit: 2022-07-11 | Discharge: 2022-07-11 | Disposition: A | Discharge status: Home / Self Care | Payer: 59 | Source: Ambulatory Visit | Attending: Family Medicine | Admitting: Family Medicine

## 2022-07-11 DIAGNOSIS — R2241 Localized swelling, mass and lump, right lower limb: Secondary | ICD-10-CM | POA: Diagnosis present

## 2022-08-05 ENCOUNTER — Telehealth: Payer: Self-pay | Admitting: *Deleted

## 2022-08-05 NOTE — Telephone Encounter (Signed)
Who Is Calling Patient / Member / Family / Caregiver Call Type Triage / Clinical Relationship To Patient Self Return Phone Number [phone] (Primary) Chief Complaint Muscle Jerks, Tics And Shudders Reason for Call Symptomatic / Request for Health Information Initial Comment Caller states she is having lower back spasms. No history of injury. Translation No Nurse Assessment Nurse: D'Heur Lucia Gaskins, RN, Adrienne Date/Time (Eastern Time): 08/04/2022 1:33:43 PM Confirm and document reason for call. If symptomatic, describe symptoms. ---Caller states she is having lower back spasms. No history of injury. It started this morning and had gotten progressively worse. They come every 15 seconds and last about 5 seconds. She walks three miles daily. No fever.  Final Disposition 08/04/2022 1:42:46 PM Go to ED Now Yes D'Heur Lucia Gaskins, RN, Vincente [place]

## 2022-08-05 NOTE — Telephone Encounter (Signed)
Patient stated she did not go to ER because she had taken some Tylenol and used a heating pad and strangely she felt better.  Advised to call again if she needed [place].

## 2022-08-08 ENCOUNTER — Ambulatory Visit (INDEPENDENT_AMBULATORY_CARE_PROVIDER_SITE_OTHER): Payer: 59 | Admitting: Psychology

## 2022-08-08 DIAGNOSIS — F4321 Adjustment disorder with depressed mood: Secondary | ICD-10-CM

## 2022-08-08 NOTE — Progress Notes (Signed)
[facility] Counselor/Therapist Progress Note  Patient ID: Adriana Lopez, MRN: [id number],    Date: 08/08/2022  Time Spent: 5:00pm-5:55pm    55 minutes   Treatment Type: Individual Therapy  Reported Symptoms: stress  Mental Status Exam: Appearance:  Casual     Behavior: Appropriate  Motor: Normal  Speech/Language:  Normal Rate  Affect: Appropriate  Mood: normal  Thought process: normal  Thought content:   WNL  Sensory/Perceptual disturbances:   WNL  Orientation: oriented to person, place, time/date, and situation  Attention: Good  Concentration: Good  Memory: WNL  Fund of knowledge:  Good  Insight:   Good  Judgment:  Good  Impulse Control: Good   Risk Assessment: Danger to Self:  No Self-injurious Behavior: No Danger to Others: No Duty to Warn:no Physical Aggression / Violence:No  Access to Firearms a concern: No  Gang Involvement:No   Subjective: Pt present for face-to-face individual therapy via video Webex.  Pt consents to telehealth video session due to COVID 19 pandemic. Location of pt: home Location of therapist: home office.  Pt talked about her relationship with [name].  They had a talk about finances and it did not go the way pt would have liked for it to go.  Pt felt like [name] did not want to step up when she requested that he pay her tax bill.   Helped pt process her feelings and worked on Network engineer.   Pt talked about her relationship with [place].   Pt and her daughter Thurmond Butts try to spend as much time with [place] as they can bc Laverle Patter has such a difficult home life.  Laverle Patter' mother has been homeless.   Addressed pt's worries and concerns about [place].  Provided supportive therapy.   Interventions: Cognitive Behavioral Therapy and Insight-Oriented  Diagnosis:  F43.21  Plan of Care: Recommend ongoing therapy.  Pt participated in setting treatment goals.  Plan to continue to meet monthly.    Treatment Plan  (Treatment Plan Target  Date:  04/10/2023) Client Abilities/Strengths  Pt is bright, engaging, and motivated for therapy.   Client Treatment Preferences  Individual therapy.  Client Statement of Needs  Improve coping skills.  Symptoms  Depressed or irritable mood.  Problems Addressed  Unipolar Depression Goals 1. Alleviate depressive symptoms and return to previous level of effective functioning. 2. Appropriately grieve the loss in order to normalize mood and to return to previously adaptive level of functioning. Objective Learn and implement behavioral strategies to overcome depression. Target Date: 2023-04-10 Frequency: Monthly  Progress: 50 Modality: individual  Related Interventions Engage the client in "behavioral activation," increasing his/her activity level and contact with sources of reward, while identifying processes that inhibit activation.  Use behavioral techniques such as instruction, rehearsal, role-playing, role reversal, as needed, to facilitate activity in the client's daily life; reinforce success. Assist the client in developing skills that increase the likelihood of deriving pleasure from behavioral activation (e.g., assertiveness skills, developing an exercise plan, less internal/more external focus, increased social involvement); reinforce success. Objective Identify important people in life, past and present, and describe the quality, good and poor, of those relationships. Target Date: 2023-04-10 Frequency: Monthly  Progress: 50 Modality: individual  Related Interventions Conduct Interpersonal Therapy beginning with the assessment of the client's "interpersonal inventory" of important past and present relationships; develop a case formulation linking depression to grief, interpersonal role disputes, role transitions, and/or interpersonal deficits). Objective Learn and implement problem-solving and decision-making skills. Target Date: 2023-04-10 Frequency: Monthly  Progress:  50  Modality: individual  Related Interventions Conduct Problem-Solving Therapy using techniques such as psychoeducation, modeling, and role-playing to teach client problem-solving skills (i.e., defining a problem specifically, generating possible solutions, evaluating the pros and cons of each solution, selecting and implementing a plan of action, evaluating the efficacy of the plan, accepting or revising the plan); role-play application of the problem-solving skill to a real life issue. Encourage in the client the development of a positive problem orientation in which problems and solving them are viewed as a natural part of life and not something to be feared, despaired, or avoided. 3. Develop healthy interpersonal relationships that lead to the alleviation and help prevent the relapse of depression. 4. Develop healthy thinking patterns and beliefs about self, others, and the world that lead to the alleviation and help prevent the relapse of depression. 5. Recognize, accept, and cope with feelings of depression. Diagnosis F43.21  Conditions For Discharge Achievement of treatment goals and objectives   Clint Bolder, LCSW

## 2022-08-18 IMAGING — DX DG SHOULDER 2+V*L*
3 series · 3 of 3 positions shown · non-contrast
Comparison: None.

CLINICAL DATA: Left shoulder pain for 4 months

EXAM:
LEFT SHOULDER - 2+ VIEW

[shoulder grashey]
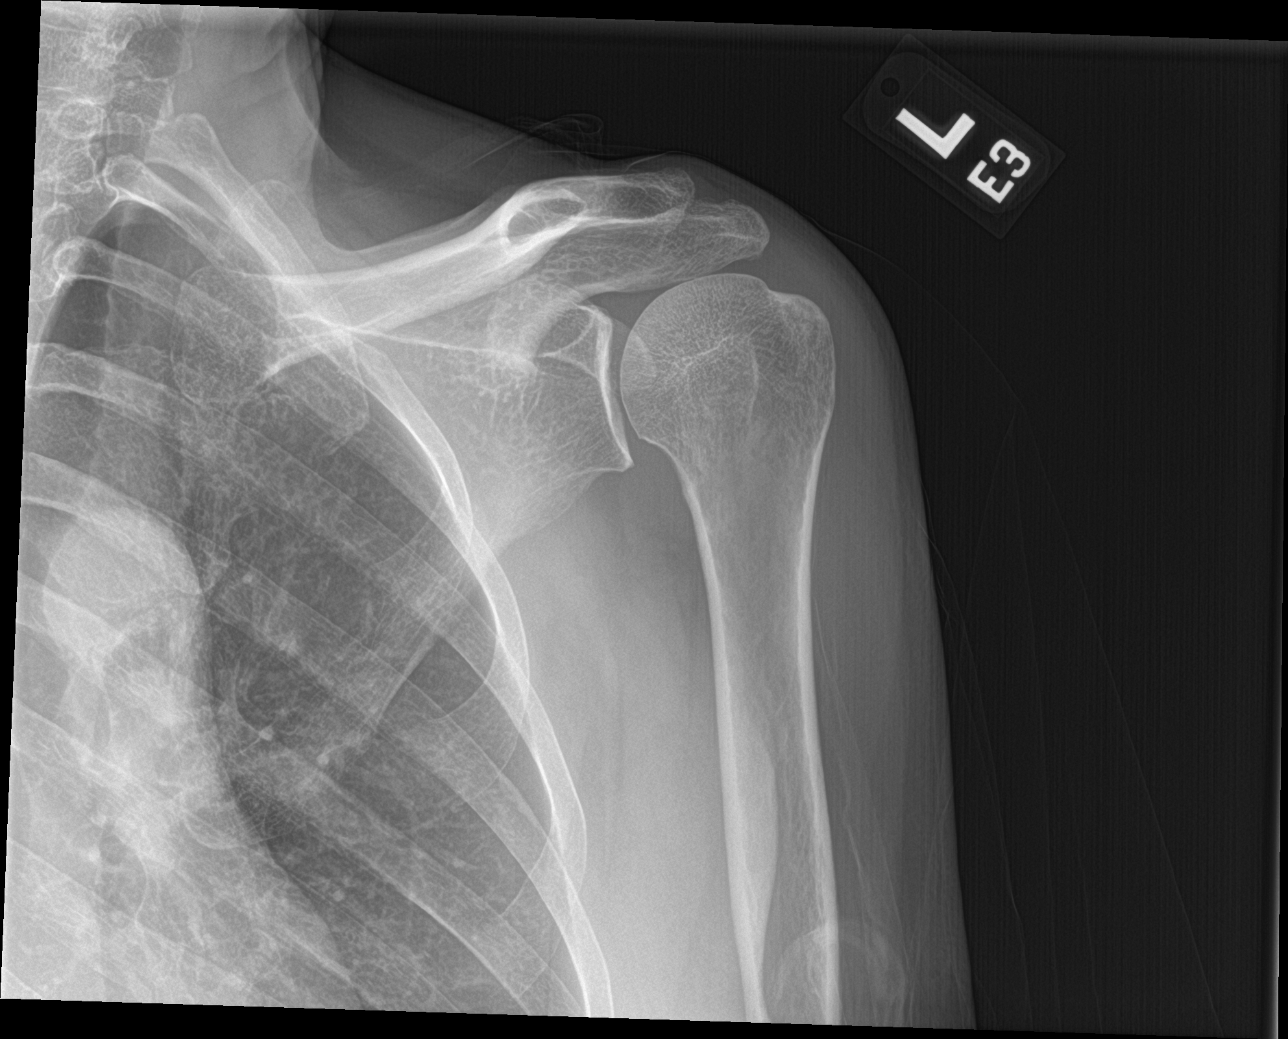

[shoulder y view]
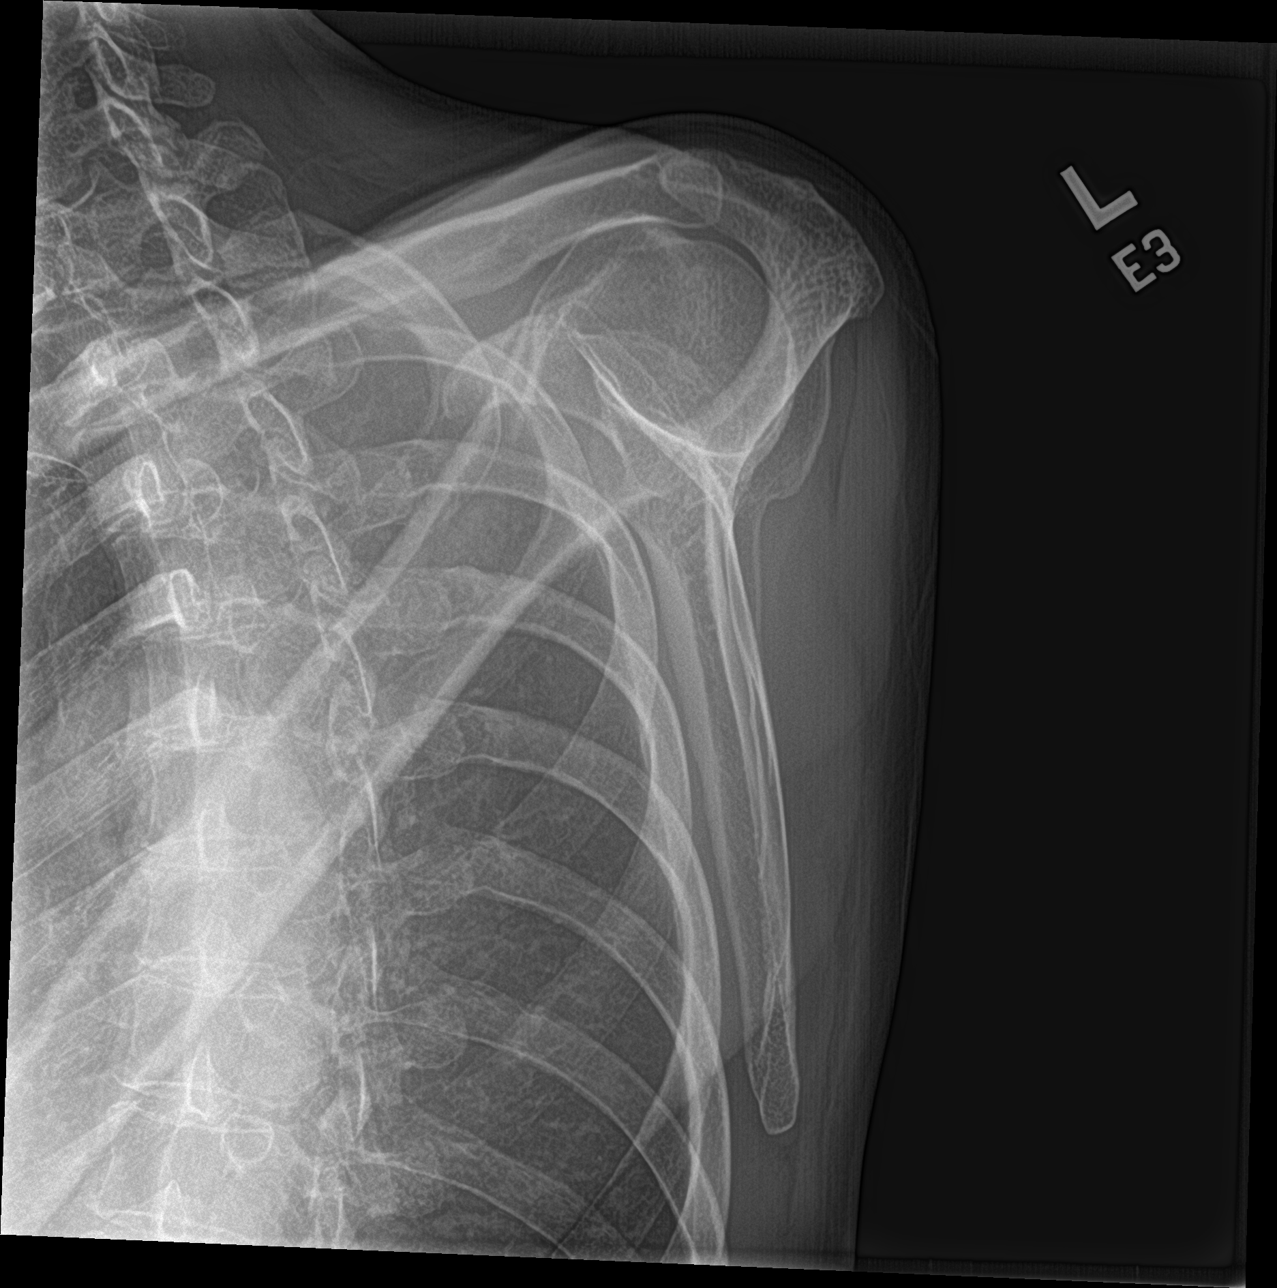

[shoulder axillary]
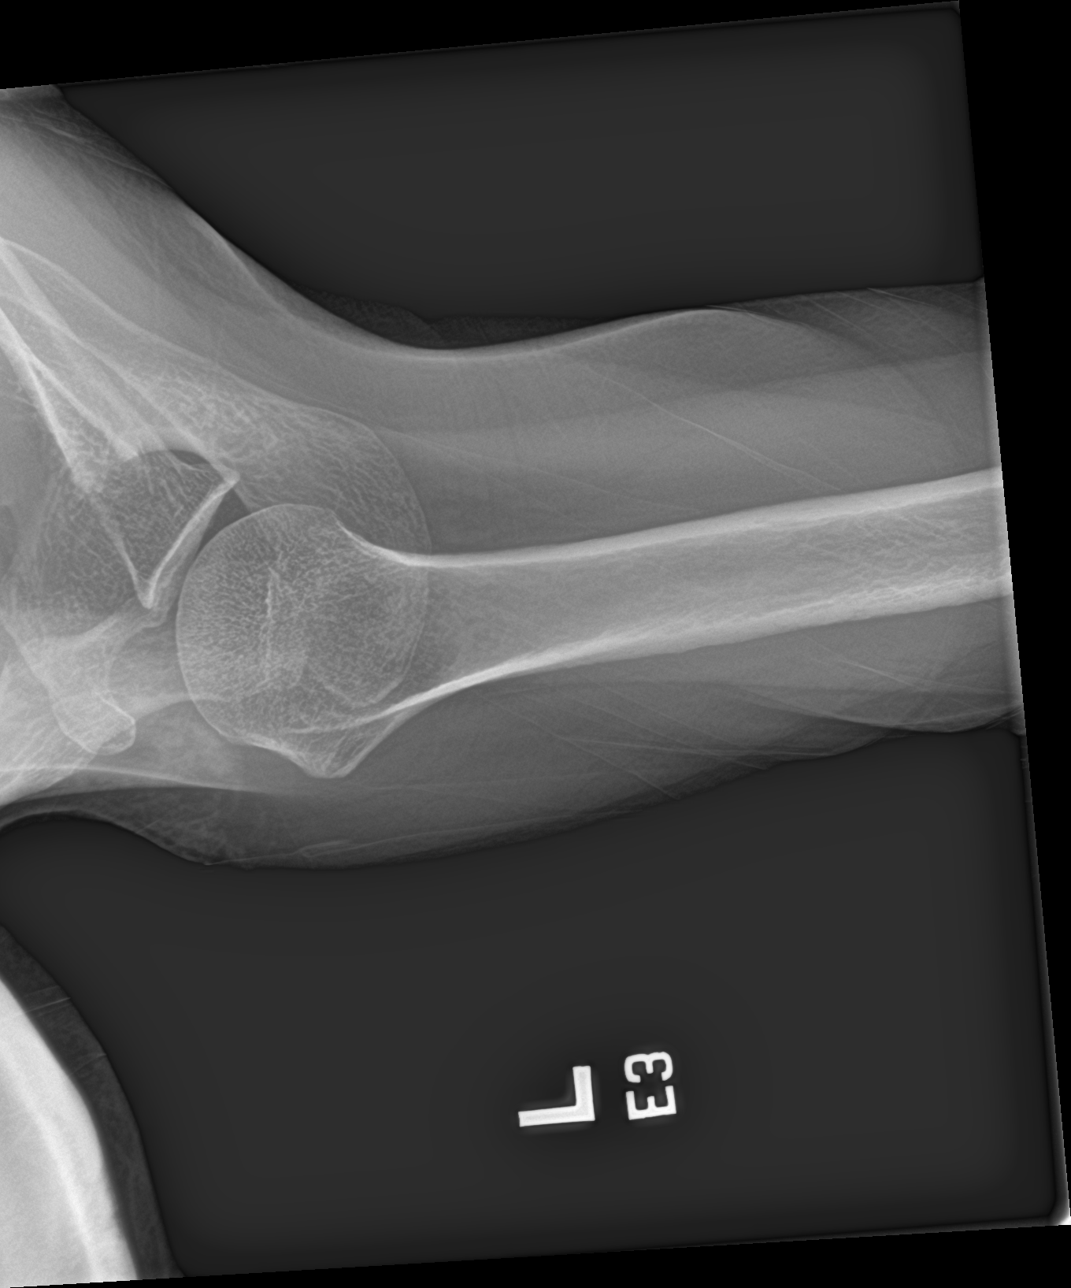

[3 of 3 positions shown; findings below may reference images not displayed]

FINDINGS: There is no evidence of fracture or dislocation. There is no
evidence of significant arthropathy or other focal bone abnormality.
Soft tissues are unremarkable.
IMPRESSION: Negative.

## 2022-08-18 IMAGING — DX DG CERVICAL SPINE COMPLETE 4+V
5 series · 5 of 5 positions shown · non-contrast
Comparison: None.

CLINICAL DATA: Neck and left shoulder pain for 4 months.

EXAM:
CERVICAL SPINE - COMPLETE 4+ VIEW

[c-spine lat]
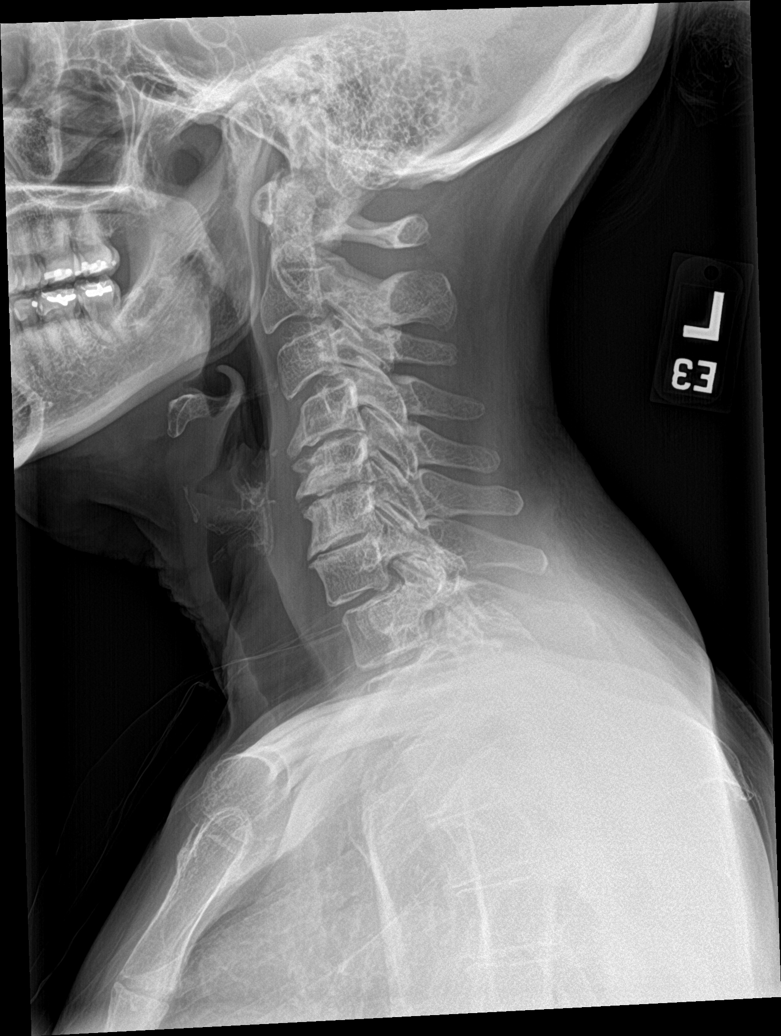

[c-spine obl (1 of 2)]
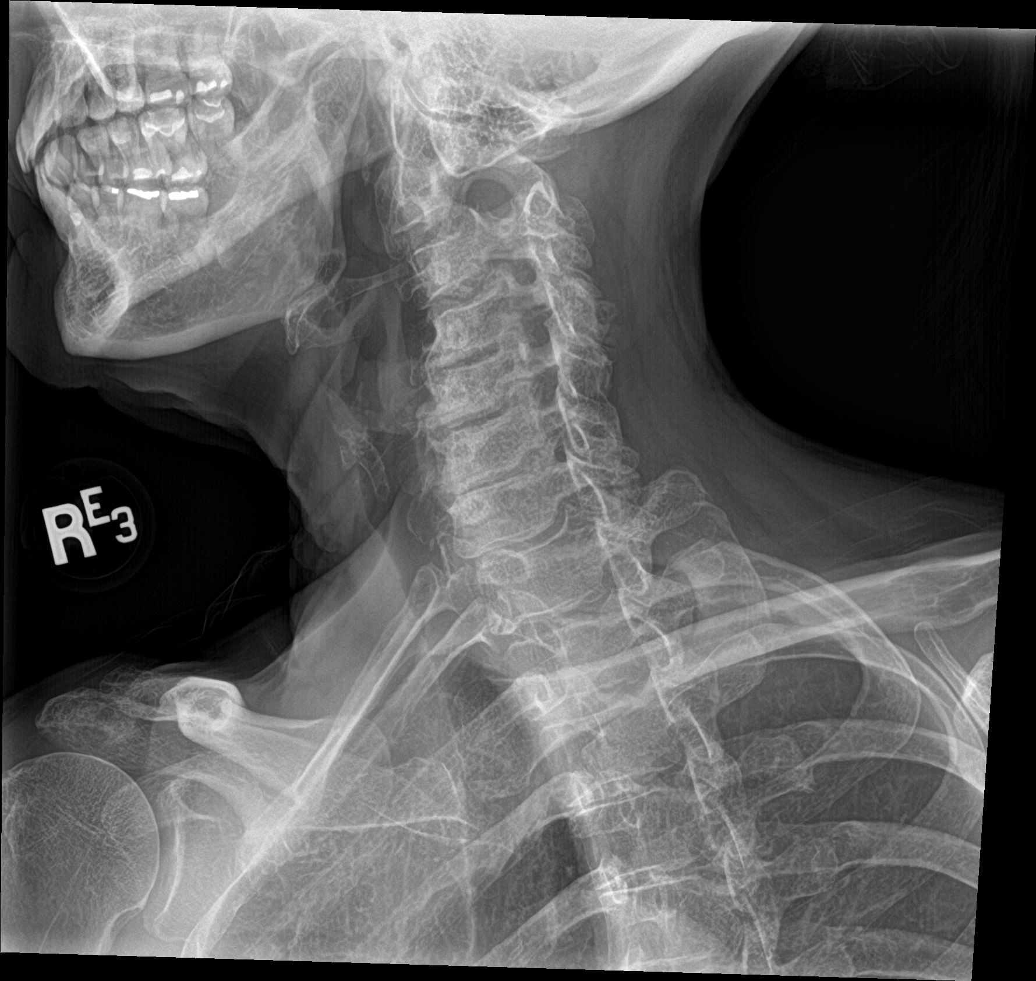

[c-spine obl (2 of 2)]
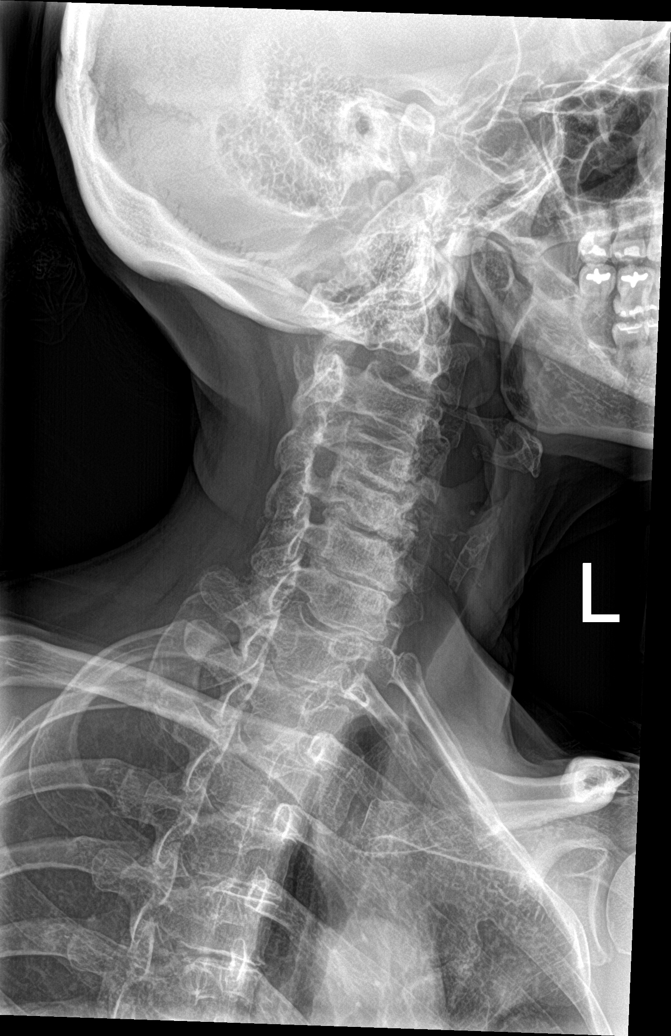

[c-spine ap]
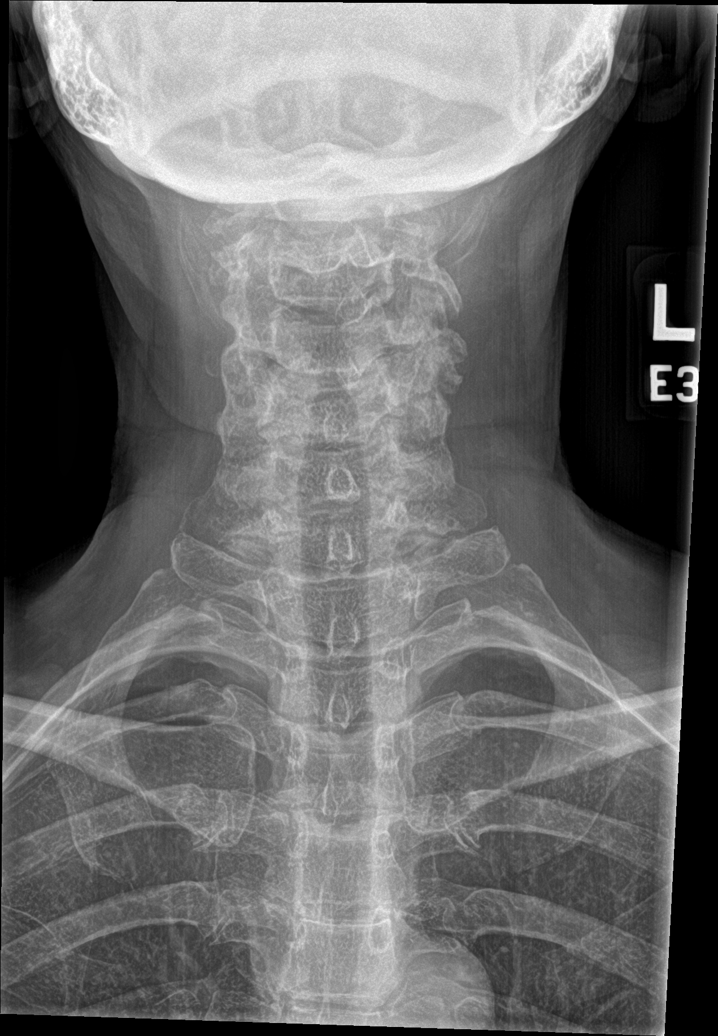

[c-spine open mouth]
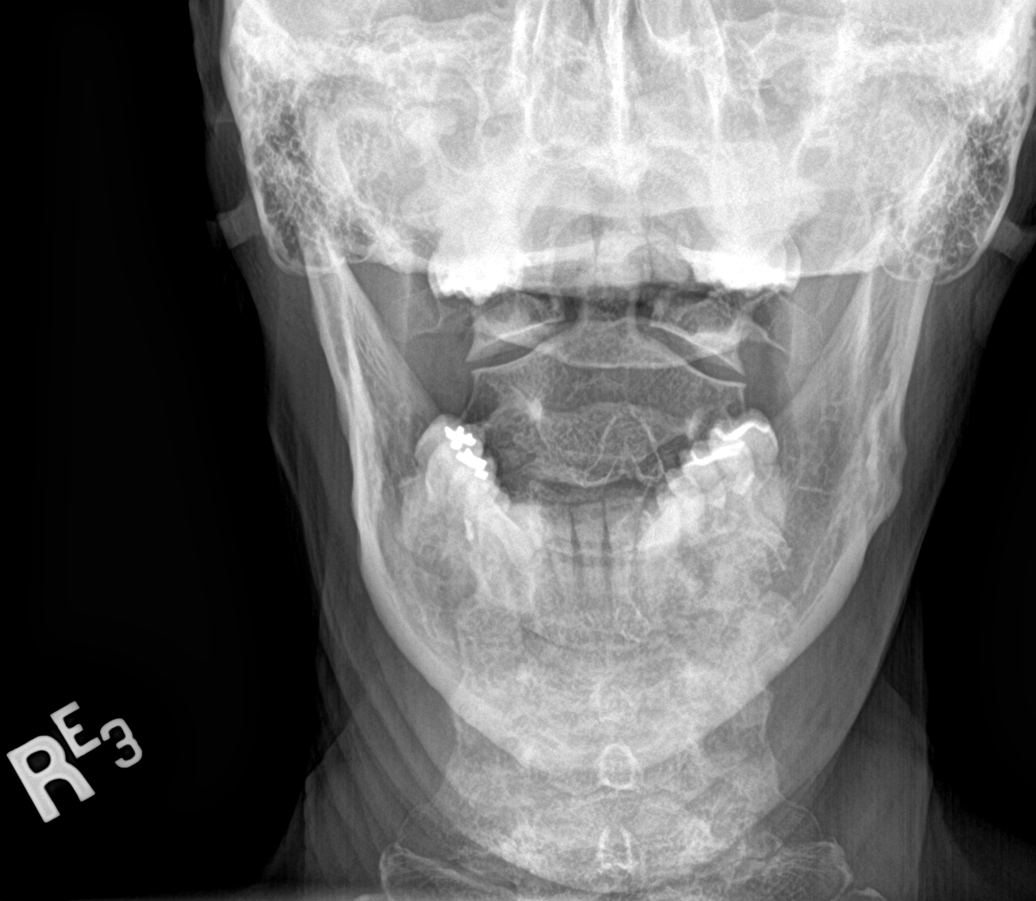

[5 of 5 positions shown; findings below may reference images not displayed]

FINDINGS: Age advanced degenerative cervical spondylosis with multilevel disc
disease and facet disease. This is most significant at C4-5, C5-6
and C6-7. There is mild degenerative anterior subluxation of C3
compared to C4 and similar anterior subluxation C7 compared to T1.

The oblique films demonstrate mild bilateral foraminal narrowing
mainly at C5-6 and C6-7.

The C1-2 articulations are maintained.

The lung apices are clear.
IMPRESSION: 1. Age advanced degenerative cervical spondylosis with multilevel
disc disease and facet disease.
2. Mild degenerative anterior subluxation of C3 compared to C4 and
C7 compared to T1.

## 2022-09-03 ENCOUNTER — Ambulatory Visit (INDEPENDENT_AMBULATORY_CARE_PROVIDER_SITE_OTHER): Payer: 59 | Admitting: Psychology

## 2022-09-03 DIAGNOSIS — F4321 Adjustment disorder with depressed mood: Secondary | ICD-10-CM

## 2022-09-03 NOTE — Progress Notes (Signed)
[facility] Counselor/Therapist Progress Note  Patient ID: Adriana Lopez, MRN: [id number],    Date: 09/03/2022  Time Spent: 4:00pm-4:55pm    55 minutes   Treatment Type: Individual Therapy  Reported Symptoms: stress  Mental Status Exam: Appearance:  Casual     Behavior: Appropriate  Motor: Normal  Speech/Language:  Normal Rate  Affect: Appropriate  Mood: normal  Thought process: normal  Thought content:   WNL  Sensory/Perceptual disturbances:   WNL  Orientation: oriented to person, place, time/date, and situation  Attention: Good  Concentration: Good  Memory: WNL  Fund of knowledge:  Good  Insight:   Good  Judgment:  Good  Impulse Control: Good   Risk Assessment: Danger to Self:  No Self-injurious Behavior: No Danger to Others: No Duty to Warn:no Physical Aggression / Violence:No  Access to Firearms a concern: No  Gang Involvement:No   Subjective: Pt present for face-to-face individual therapy via video Webex.  Pt consents to telehealth video session due to COVID 19 pandemic. Location of pt: home Location of therapist: home office.  Pt talked about work.   Work has been very busy but overall the job has been going well.   Pt talked about her relationship with her family.   Her son is doing much better and is playing in tennis tournaments.   Pt's daughter is pregnant.  Pt is upset about this bc her daughter is dating a man pt does not approve of.   Pt does not know if her daughter will keep the baby or get an abortion.   Helped pt process her feelings.   Provided supportive therapy.   Interventions: Cognitive Behavioral Therapy and Insight-Oriented  Diagnosis:  F43.21  Plan of Care: Recommend ongoing therapy.  Pt participated in setting treatment goals.  Plan to continue to meet monthly.    Treatment Plan  (Treatment Plan Target Date:  04/10/2023) Client Abilities/Strengths  Pt is bright, engaging, and motivated for therapy.   Client Treatment  Preferences  Individual therapy.  Client Statement of Needs  Improve coping skills.  Symptoms  Depressed or irritable mood.  Problems Addressed  Unipolar Depression Goals 1. Alleviate depressive symptoms and return to previous level of effective functioning. 2. Appropriately grieve the loss in order to normalize mood and to return to previously adaptive level of functioning. Objective Learn and implement behavioral strategies to overcome depression. Target Date: 2023-04-10 Frequency: Monthly  Progress: 50 Modality: individual  Related Interventions Engage the client in "behavioral activation," increasing his/her activity level and contact with sources of reward, while identifying processes that inhibit activation.  Use behavioral techniques such as instruction, rehearsal, role-playing, role reversal, as needed, to facilitate activity in the client's daily life; reinforce success. Assist the client in developing skills that increase the likelihood of deriving pleasure from behavioral activation (e.g., assertiveness skills, developing an exercise plan, less internal/more external focus, increased social involvement); reinforce success. Objective Identify important people in life, past and present, and describe the quality, good and poor, of those relationships. Target Date: 2023-04-10 Frequency: Monthly  Progress: 50 Modality: individual  Related Interventions Conduct Interpersonal Therapy beginning with the assessment of the client's "interpersonal inventory" of important past and present relationships; develop a case formulation linking depression to grief, interpersonal role disputes, role transitions, and/or interpersonal deficits). Objective Learn and implement problem-solving and decision-making skills. Target Date: 2023-04-10 Frequency: Monthly  Progress: 50 Modality: individual  Related Interventions Conduct Problem-Solving Therapy using techniques such as psychoeducation,  modeling, and role-playing to teach  client problem-solving skills (i.e., defining a problem specifically, generating possible solutions, evaluating the pros and cons of each solution, selecting and implementing a plan of action, evaluating the efficacy of the plan, accepting or revising the plan); role-play application of the problem-solving skill to a real life issue. Encourage in the client the development of a positive problem orientation in which problems and solving them are viewed as a natural part of life and not something to be feared, despaired, or avoided. 3. Develop healthy interpersonal relationships that lead to the alleviation and help prevent the relapse of depression. 4. Develop healthy thinking patterns and beliefs about self, others, and the world that lead to the alleviation and help prevent the relapse of depression. 5. Recognize, accept, and cope with feelings of depression. Diagnosis F43.21  Conditions For Discharge Achievement of treatment goals and objectives   Clint Bolder, LCSW

## 2022-10-02 ENCOUNTER — Ambulatory Visit: Payer: 59 | Admitting: Psychology

## 2022-11-03 ENCOUNTER — Telehealth: Payer: 59 | Admitting: Physician Assistant

## 2022-11-03 ENCOUNTER — Encounter: Payer: Self-pay | Admitting: Family Medicine

## 2022-11-03 DIAGNOSIS — J069 Acute upper respiratory infection, unspecified: Secondary | ICD-10-CM | POA: Diagnosis not present

## 2022-11-03 MED ORDER — BENZONATATE 100 MG PO CAPS
100.0000 mg | ORAL_CAPSULE | Freq: Three times a day (TID) | ORAL | 0 refills | Status: DC | PRN
  Filled 2022-11-03 – 2022-11-04 (×2): qty 20, 7d supply, fill #0

## 2022-11-03 MED ORDER — FLUTICASONE PROPIONATE 50 MCG/ACT NA SUSP
2.0000 | Freq: Every day | NASAL | 6 refills | Status: DC
  Filled 2022-11-03 – 2022-11-04 (×2): qty 16, 30d supply, fill #0

## 2022-11-03 NOTE — Progress Notes (Signed)
E-Visit for Upper Respiratory Infection   We are sorry you are not feeling well.  Here is how we plan to help!  Based on what you have shared with me, it looks like you may have a viral upper respiratory infection.  Upper respiratory infections are caused by a large number of viruses; however, rhinovirus is the most common cause.   Symptoms vary from person to person, with common symptoms including sore throat, cough, fatigue or lack of energy and feeling of general discomfort.  A low-grade fever of up to 100.4 may present, but is often uncommon.  Symptoms vary however, and are closely related to a person's age or underlying illnesses.  The most common symptoms associated with an upper respiratory infection are nasal discharge or congestion, cough, sneezing, headache and pressure in the ears and face.  These symptoms usually persist for about 3 to 10 days, but can last up to 2 weeks.  It is important to know that upper respiratory infections do not cause serious illness or complications in most cases.    Upper respiratory infections can be transmitted from person to person, with the most common method of transmission being a person's hands.  The virus is able to live on the skin and can infect other persons for up to 2 hours after direct contact.  Also, these can be transmitted when someone coughs or sneezes; thus, it is important to cover the mouth to reduce this risk.  To keep the spread of the illness at bay, good hand hygiene is very important.  This is an infection that is most likely caused by a virus. There are no specific treatments other than to help you with the symptoms until the infection runs its course.  We are sorry you are not feeling well.  Here is how we plan to help!   For nasal congestion, you may use an oral decongestants such as Mucinex D or if you have glaucoma or high blood pressure use plain Mucinex.  Saline nasal spray or nasal drops can help and can safely be used as often as  needed for congestion.  For your congestion, I have prescribed Fluticasone nasal spray one spray in each nostril twice a day  If you do not have a history of heart disease, hypertension, diabetes or thyroid disease, prostate/bladder issues or glaucoma, you may also use Sudafed to treat nasal congestion.  It is highly recommended that you consult with a pharmacist or your primary care physician to ensure this medication is safe for you to take.     If you have a cough, you may use cough suppressants such as Delsym and Robitussin.  If you have glaucoma or high blood pressure, you can also use Coricidin HBP.   For cough I have prescribed for you A prescription cough medication called Tessalon Perles 100 mg. You may take 1-2 capsules every 8 hours as needed for cough  If you have a sore or scratchy throat, use a saltwater gargle-  to  teaspoon of salt dissolved in a 4-ounce to 8-ounce glass of warm water.  Gargle the solution for approximately 15-30 seconds and then spit.  It is important not to swallow the solution.  You can also use throat lozenges/cough drops and Chloraseptic spray to help with throat pain or discomfort.  Warm or cold liquids can also be helpful in relieving throat pain.  For headache, pain or general discomfort, you can use Ibuprofen or Tylenol as directed.   Some authorities believe   that zinc sprays or the use of Echinacea may shorten the course of your symptoms.   HOME CARE Only take medications as instructed by your medical team. Be sure to drink plenty of fluids. Water is fine as well as fruit juices, sodas and electrolyte beverages. You may want to stay away from caffeine or alcohol. If you are nauseated, try taking small sips of liquids. How do you know if you are getting enough fluid? Your urine should be a pale yellow or almost colorless. Get rest. Taking a steamy shower or using a humidifier may help nasal congestion and ease sore throat pain. You can place a towel over  your head and breathe in the steam from hot water coming from a faucet. Using a saline nasal spray works much the same way. Cough drops, hard candies and sore throat lozenges may ease your cough. Avoid close contacts especially the very young and the elderly Cover your mouth if you cough or sneeze Always remember to wash your hands.   GET HELP RIGHT AWAY IF: You develop worsening fever. If your symptoms do not improve within 10 days You develop yellow or green discharge from your nose over 3 days. You have coughing fits You develop a severe head ache or visual changes. You develop shortness of breath, difficulty breathing or start having chest pain Your symptoms persist after you have completed your treatment plan  MAKE SURE YOU  Understand these instructions. Will watch your condition. Will get help right away if you are not doing well or get worse.  Thank you for choosing an e-visit.  Your e-visit answers were reviewed by a board certified advanced clinical practitioner to complete your personal care plan. Depending upon the condition, your plan could have included both over the counter or prescription medications.  Please review your pharmacy choice. Make sure the pharmacy is open so you can pick up prescription now. If there is a problem, you may contact your provider through [employer] and have the prescription routed to another pharmacy.  Your safety is important to [place]. If you have drug allergies check your prescription carefully.   For the next 24 hours you can use MyChart to ask questions about today's visit, request a non-urgent call back, or ask for a work or school excuse. You will get an email in the next two days asking about your experience. I hope that your e-visit has been valuable and will speed your recovery.    I have spent 5 minutes in review of e-visit questionnaire, review and updating patient chart, medical decision making and response to patient.    Lenise Arena Ward, PA-C

## 2022-11-04 ENCOUNTER — Other Ambulatory Visit (HOSPITAL_BASED_OUTPATIENT_CLINIC_OR_DEPARTMENT_OTHER): Payer: Self-pay

## 2022-11-04 ENCOUNTER — Other Ambulatory Visit: Payer: Self-pay

## 2022-11-04 ENCOUNTER — Other Ambulatory Visit: Payer: Self-pay | Admitting: Family Medicine

## 2022-11-04 MED ORDER — AZITHROMYCIN 250 MG PO TABS: ORAL_TABLET | ORAL | 0 refills | Status: DC

## 2022-11-06 ENCOUNTER — Other Ambulatory Visit (HOSPITAL_BASED_OUTPATIENT_CLINIC_OR_DEPARTMENT_OTHER): Payer: Self-pay

## 2022-11-26 ENCOUNTER — Ambulatory Visit: Payer: 59 | Admitting: Psychology

## 2022-11-26 DIAGNOSIS — F4321 Adjustment disorder with depressed mood: Secondary | ICD-10-CM | POA: Diagnosis not present

## 2022-11-26 NOTE — Progress Notes (Signed)
[facility] Counselor/Therapist Progress Note  Patient ID: Adriana Lopez, MRN: [id number],    Date: 11/26/2022  Time Spent: 5:00pm-5:55pm    55 minutes   Treatment Type: Individual Therapy  Reported Symptoms: stress  Mental Status Exam: Appearance:  Casual     Behavior: Appropriate  Motor: Normal  Speech/Language:  Normal Rate  Affect: Appropriate  Mood: normal  Thought process: normal  Thought content:   WNL  Sensory/Perceptual disturbances:   WNL  Orientation: oriented to person, place, time/date, and situation  Attention: Good  Concentration: Good  Memory: WNL  Fund of knowledge:  Good  Insight:   Good  Judgment:  Good  Impulse Control: Good   Risk Assessment: Danger to Self:  No Self-injurious Behavior: No Danger to Others: No Duty to Warn:no Physical Aggression / Violence:No  Access to Firearms a concern: No  Gang Involvement:No   Subjective: Pt present for face-to-face individual therapy via video Webex.  Pt consents to telehealth video session due to COVID 19 pandemic. Location of pt: home Location of therapist: home office.  Pt talked about her daughter Thurmond Butts being pregnant.   Her daughter is keeping the baby.  Her pregnancy hormones have affected her and she is lashing out at pt and family often.   Addressed how this affects pt.  Thurmond Butts has said very hurtful things to pt and [name].  Helped pt process her feelings and relationship dynamics.   Thurmond Butts is due to have her baby boy in early June.   Pt talked about work.  The job is going well but she is feeling very tired.   Worked on self care strategies.     Provided supportive therapy.   Interventions: Cognitive Behavioral Therapy and Insight-Oriented  Diagnosis:  F43.21  Plan of Care: Recommend ongoing therapy.  Pt participated in setting treatment goals.  Plan to continue to meet monthly.    Treatment Plan  (Treatment Plan Target Date:  04/10/2023) Client Abilities/Strengths  Pt is bright,  engaging, and motivated for therapy.   Client Treatment Preferences  Individual therapy.  Client Statement of Needs  Improve coping skills.  Symptoms  Depressed or irritable mood.  Problems Addressed  Unipolar Depression Goals 1. Alleviate depressive symptoms and return to previous level of effective functioning. 2. Appropriately grieve the loss in order to normalize mood and to return to previously adaptive level of functioning. Objective Learn and implement behavioral strategies to overcome depression. Target Date: 2023-04-10 Frequency: Monthly  Progress: 50 Modality: individual  Related Interventions Engage the client in "behavioral activation," increasing his/her activity level and contact with sources of reward, while identifying processes that inhibit activation.  Use behavioral techniques such as instruction, rehearsal, role-playing, role reversal, as needed, to facilitate activity in the client's daily life; reinforce success. Assist the client in developing skills that increase the likelihood of deriving pleasure from behavioral activation (e.g., assertiveness skills, developing an exercise plan, less internal/more external focus, increased social involvement); reinforce success. Objective Identify important people in life, past and present, and describe the quality, good and poor, of those relationships. Target Date: 2023-04-10 Frequency: Monthly  Progress: 50 Modality: individual  Related Interventions Conduct Interpersonal Therapy beginning with the assessment of the client's "interpersonal inventory" of important past and present relationships; develop a case formulation linking depression to grief, interpersonal role disputes, role transitions, and/or interpersonal deficits). Objective Learn and implement problem-solving and decision-making skills. Target Date: 2023-04-10 Frequency: Monthly  Progress: 50 Modality: individual  Related Interventions Conduct Problem-Solving  Therapy using  techniques such as psychoeducation, modeling, and role-playing to teach client problem-solving skills (i.e., defining a problem specifically, generating possible solutions, evaluating the pros and cons of each solution, selecting and implementing a plan of action, evaluating the efficacy of the plan, accepting or revising the plan); role-play application of the problem-solving skill to a real life issue. Encourage in the client the development of a positive problem orientation in which problems and solving them are viewed as a natural part of life and not something to be feared, despaired, or avoided. 3. Develop healthy interpersonal relationships that lead to the alleviation and help prevent the relapse of depression. 4. Develop healthy thinking patterns and beliefs about self, others, and the world that lead to the alleviation and help prevent the relapse of depression. 5. Recognize, accept, and cope with feelings of depression. Diagnosis F43.21  Conditions For Discharge Achievement of treatment goals and objectives   Clint Bolder, LCSW

## 2022-12-24 ENCOUNTER — Ambulatory Visit: Payer: 59 | Admitting: Psychology

## 2022-12-24 DIAGNOSIS — F4321 Adjustment disorder with depressed mood: Secondary | ICD-10-CM

## 2022-12-24 NOTE — Progress Notes (Signed)
[place] Counselor/Therapist Progress Note  Patient ID: Josiah Poellnitz, MRN: [id number],    Date: 12/24/2022  Time Spent: 4:00pm-4:55pm    55 minutes   Treatment Type: Individual Therapy  Reported Symptoms: stress  Mental Status Exam: Appearance:  Casual     Behavior: Appropriate  Motor: Normal  Speech/Language:  Normal Rate  Affect: Appropriate  Mood: normal  Thought process: normal  Thought content:   WNL  Sensory/Perceptual disturbances:   WNL  Orientation: oriented to person, place, time/date, and situation  Attention: Good  Concentration: Good  Memory: WNL  Fund of knowledge:  Good  Insight:   Good  Judgment:  Good  Impulse Control: Good   Risk Assessment: Danger to Self:  No Self-injurious Behavior: No Danger to Others: No Duty to Warn:no Physical Aggression / Violence:No  Access to Firearms a concern: No  Gang Involvement:No   Subjective: Pt present for face-to-face individual therapy via video Webex.  Pt consents to telehealth video session due to COVID 19 pandemic. Location of pt: home Location of therapist: home office.  Pt talked about her relationship with Thurmond Butts.  Thurmond Butts has been difficult and lashing out at times.   Pt copes by pulling back from the relationship.  Addressed the difficult interactions they have had.  Helped pt process her feelings and relationship dynamics.  Addressed how pt can set healthy boundaries.  Worked on self care strategies.     Provided supportive therapy.   Interventions: Cognitive Behavioral Therapy and Insight-Oriented  Diagnosis:  F43.21  Plan of Care: Recommend ongoing therapy.  Pt participated in setting treatment goals.  Plan to continue to meet monthly.    Treatment Plan  (Treatment Plan Target Date:  04/10/2023) Client Abilities/Strengths  Pt is bright, engaging, and motivated for therapy.   Client Treatment Preferences  Individual therapy.  Client Statement of Needs  Improve coping skills.   Symptoms  Depressed or irritable mood.  Problems Addressed  Unipolar Depression Goals 1. Alleviate depressive symptoms and return to previous level of effective functioning. 2. Appropriately grieve the loss in order to normalize mood and to return to previously adaptive level of functioning. Objective Learn and implement behavioral strategies to overcome depression. Target Date: 2023-04-10 Frequency: Monthly  Progress: 50 Modality: individual  Related Interventions Engage the client in "behavioral activation," increasing his/her activity level and contact with sources of reward, while identifying processes that inhibit activation.  Use behavioral techniques such as instruction, rehearsal, role-playing, role reversal, as needed, to facilitate activity in the client's daily life; reinforce success. Assist the client in developing skills that increase the likelihood of deriving pleasure from behavioral activation (e.g., assertiveness skills, developing an exercise plan, less internal/more external focus, increased social involvement); reinforce success. Objective Identify important people in life, past and present, and describe the quality, good and poor, of those relationships. Target Date: 2023-04-10 Frequency: Monthly  Progress: 50 Modality: individual  Related Interventions Conduct Interpersonal Therapy beginning with the assessment of the client's "interpersonal inventory" of important past and present relationships; develop a case formulation linking depression to grief, interpersonal role disputes, role transitions, and/or interpersonal deficits). Objective Learn and implement problem-solving and decision-making skills. Target Date: 2023-04-10 Frequency: Monthly  Progress: 50 Modality: individual  Related Interventions Conduct Problem-Solving Therapy using techniques such as psychoeducation, modeling, and role-playing to teach client problem-solving skills (i.e., defining a problem  specifically, generating possible solutions, evaluating the pros and cons of each solution, selecting and implementing a plan of action, evaluating the efficacy of the  plan, accepting or revising the plan); role-play application of the problem-solving skill to a real life issue. Encourage in the client the development of a positive problem orientation in which problems and solving them are viewed as a natural part of life and not something to be feared, despaired, or avoided. 3. Develop healthy interpersonal relationships that lead to the alleviation and help prevent the relapse of depression. 4. Develop healthy thinking patterns and beliefs about self, others, and the world that lead to the alleviation and help prevent the relapse of depression. 5. Recognize, accept, and cope with feelings of depression. Diagnosis F43.21  Conditions For Discharge Achievement of treatment goals and objectives   Clint Bolder, LCSW

## 2023-02-04 ENCOUNTER — Ambulatory Visit: Payer: 59 | Admitting: Psychology

## 2023-02-04 ENCOUNTER — Other Ambulatory Visit (HOSPITAL_BASED_OUTPATIENT_CLINIC_OR_DEPARTMENT_OTHER): Payer: Self-pay

## 2023-02-04 DIAGNOSIS — F4321 Adjustment disorder with depressed mood: Secondary | ICD-10-CM

## 2023-02-04 MED ORDER — PROGESTERONE MICRONIZED 100 MG PO CAPS
100.0000 mg | ORAL_CAPSULE | Freq: Every evening | ORAL | 0 refills | Status: DC
  Filled 2023-02-26: qty 30, 30d supply, fill #0
  Filled 2023-03-25: qty 30, 30d supply, fill #1

## 2023-02-04 MED ORDER — ESTRADIOL 0.05 MG/24HR TD PTTW
1.0000 | MEDICATED_PATCH | TRANSDERMAL | 5 refills | Status: AC
  Filled 2023-02-11: qty 8, 28d supply, fill #0
  Filled 2023-03-12: qty 8, 28d supply, fill #1
  Filled 2023-04-11: qty 8, 28d supply, fill #2
  Filled 2023-05-19: qty 8, 28d supply, fill #3

## 2023-02-04 NOTE — Progress Notes (Signed)
[facility] Counselor/Therapist Progress Note  Patient ID: Adriana Lopez, MRN: [id number],    Date: 02/04/2023  Time Spent: 5:00pm-5:55pm    55 minutes   Treatment Type: Individual Therapy  Reported Symptoms: stress  Mental Status Exam: Appearance:  Casual     Behavior: Appropriate  Motor: Normal  Speech/Language:  Normal Rate  Affect: Appropriate  Mood: normal  Thought process: normal  Thought content:   WNL  Sensory/Perceptual disturbances:   WNL  Orientation: oriented to person, place, time/date, and situation  Attention: Good  Concentration: Good  Memory: WNL  Fund of knowledge:  Good  Insight:   Good  Judgment:  Good  Impulse Control: Good   Risk Assessment: Danger to Self:  No Self-injurious Behavior: No Danger to Others: No Duty to Warn:no Physical Aggression / Violence:No  Access to Firearms a concern: No  Gang Involvement:No   Subjective: Pt present for face-to-face individual therapy via video Webex.  Pt consents to telehealth video session due to COVID 19 pandemic. Location of pt: home Location of therapist: home office.  Pt talked about work.  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   A colleague pt really liked ended up leaving which is disappointing to her.   Pt talked about her relationship with her daughter [name] and [place].   They are concerned about how Danielle Dess is doing bc of issues with Danielle Dess' mother. [name] had a baby shower and many past friends of the family attended.   Pt's ex husband [name] also attended.  [name] said some things that rubbed pt the wrong way.   Pt was anxious about being around him.  [name] has also been difficult to deal with.  Helped pt process her feelings and relationship dynamics.  Addressed how pt can set healthy boundaries.  Worked on self care strategies.     Provided supportive therapy.   Interventions: Cognitive Behavioral Therapy and Insight-Oriented  Diagnosis:  F43.21  Plan of  Care: Recommend ongoing therapy.  Pt participated in setting treatment goals.  Plan to continue to meet monthly.    Treatment Plan  (Treatment Plan Target Date:  04/10/2023) Client Abilities/Strengths  Pt is bright, engaging, and motivated for therapy.   Client Treatment Preferences  Individual therapy.  Client Statement of Needs  Improve coping skills.  Symptoms  Depressed or irritable mood.  Problems Addressed  Unipolar Depression Goals 1. Alleviate depressive symptoms and return to previous level of effective functioning. 2. Appropriately grieve the loss in order to normalize mood and to return to previously adaptive level of functioning. Objective Learn and implement behavioral strategies to overcome depression. Target Date: 2023-04-10 Frequency: Monthly  Progress: 50 Modality: individual  Related Interventions Engage the client in "behavioral activation," increasing his/her activity level and contact with sources of reward, while identifying processes that inhibit activation.  Use behavioral techniques such as instruction, rehearsal, role-playing, role reversal, as needed, to facilitate activity in the client's daily life; reinforce success. Assist the client in developing skills that increase the likelihood of deriving pleasure from behavioral activation (e.g., assertiveness skills, developing an exercise plan, less internal/more external focus, increased social involvement); reinforce success. Objective Identify important people in life, past and present, and describe the quality, good and poor, of those relationships. Target Date: 2023-04-10 Frequency: Monthly  Progress: 50 Modality: individual  Related Interventions Conduct Interpersonal Therapy beginning with the assessment of the client's "interpersonal inventory" of important past and present relationships; develop a case formulation  linking depression to grief, interpersonal role disputes, role transitions, and/or  interpersonal deficits). Objective Learn and implement problem-solving and decision-making skills. Target Date: 2023-04-10 Frequency: Monthly  Progress: 50 Modality: individual  Related Interventions Conduct Problem-Solving Therapy using techniques such as psychoeducation, modeling, and role-playing to teach client problem-solving skills (i.e., defining a problem specifically, generating possible solutions, evaluating the pros and cons of each solution, selecting and implementing a plan of action, evaluating the efficacy of the plan, accepting or revising the plan); role-play application of the problem-solving skill to a real life issue. Encourage in the client the development of a positive problem orientation in which problems and solving them are viewed as a natural part of life and not something to be feared, despaired, or avoided. 3. Develop healthy interpersonal relationships that lead to the alleviation and help prevent the relapse of depression. 4. Develop healthy thinking patterns and beliefs about self, others, and the world that lead to the alleviation and help prevent the relapse of depression. 5. Recognize, accept, and cope with feelings of depression. Diagnosis F43.21  Conditions For Discharge Achievement of treatment goals and objectives   Salomon Fick, LCSW

## 2023-02-11 ENCOUNTER — Other Ambulatory Visit (HOSPITAL_BASED_OUTPATIENT_CLINIC_OR_DEPARTMENT_OTHER): Payer: Self-pay

## 2023-02-26 ENCOUNTER — Other Ambulatory Visit (HOSPITAL_BASED_OUTPATIENT_CLINIC_OR_DEPARTMENT_OTHER): Payer: Self-pay

## 2023-03-06 ENCOUNTER — Ambulatory Visit: Payer: 59 | Admitting: Psychology

## 2023-03-12 ENCOUNTER — Other Ambulatory Visit (HOSPITAL_BASED_OUTPATIENT_CLINIC_OR_DEPARTMENT_OTHER): Payer: Self-pay

## 2023-03-31 ENCOUNTER — Ambulatory Visit: Payer: 59 | Admitting: Psychology

## 2023-04-11 ENCOUNTER — Other Ambulatory Visit (HOSPITAL_BASED_OUTPATIENT_CLINIC_OR_DEPARTMENT_OTHER): Payer: Self-pay

## 2023-04-22 ENCOUNTER — Ambulatory Visit (INDEPENDENT_AMBULATORY_CARE_PROVIDER_SITE_OTHER): Payer: 59 | Admitting: Psychology

## 2023-04-22 DIAGNOSIS — F4321 Adjustment disorder with depressed mood: Secondary | ICD-10-CM

## 2023-04-22 NOTE — Progress Notes (Signed)
[facility] Counselor Initial Adult Exam  Name: Adriana Lopez Date: 04/22/2023 MRN: [id number] DOB: 1961-04-28 PCP: Donato Schultz, DO  Time spent: 3:00pm - 3:55pm      55 minutes  Guardian/Payee:  n/a    Paperwork requested: No   Reason for Visit /Presenting Problem: Pt present for face-to-face initial assessment update via video.  Pt consents to telehealth video session and is aware of limitations of virtual sessions. Location of pt: home Location of therapist: home office.  Pt talked about a lot going on in her family.   She adopted a Building control surveyor a few weeks ago.  Evaristo Bury is one year old and is adjusting well to pt's home.  Pt's daughter had her baby.  The birth went well and pt was present at the hospital.  Pt's daughter [name] has bonded well with her baby boy. Pt states she has felt sad and anxious lately thinking about past losses.  Helped pt process her feelings. Pt's son and daughter are not getting along which triggers her.  Addressed that pt seems to be grieving past losses. There have also been some issues with pt's partner [name].   Reviewed pt's treatment plan for annual update.    Updated treatment plan and IA.  Pt participated in setting treatment goals.  Pt is in agreement with treatment plan.  Plan to continue to meet monthly.    Mental Status Exam: Appearance:   Casual     Behavior:  Appropriate  Motor:  Normal  Speech/Language:   Normal Rate  Affect:  Appropriate  Mood:  normal  Thought process:  normal  Thought content:    WNL  Sensory/Perceptual disturbances:    WNL  Orientation:  oriented to person, place, time/date, and situation  Attention:  Good  Concentration:  Good  Memory:  WNL  Fund of knowledge:   Good  Insight:    Good  Judgment:   Good  Impulse Control:  Good     Reported Symptoms:  stress  Risk Assessment: Danger to Self:  No Self-injurious Behavior: No Danger to Others: No Duty to Warn:no Physical  Aggression / Violence:No  Access to Firearms a concern: No  Gang Involvement:No  Patient / guardian was educated about steps to take if suicide or homicide risk level increases between visits: n/a While future psychiatric events cannot be accurately predicted, the patient does not currently require acute inpatient psychiatric care and does not currently meet [facility] involuntary commitment criteria.  Substance Abuse History: Current substance abuse: No     Past Psychiatric History:   Previous psychological history is significant for anxiety and depression Outpatient Providers:Pt has been in therapy in the past.  She had anxiety and depression when in previous marriage and going through her divorce.    History of Psych Hospitalization: No  Psychological Testing:  n/a    Abuse History:  Victim of: No.,  n/a    Report needed: No. Victim of Neglect:No. Perpetrator of  n/a   Witness / Exposure to Domestic Violence: No   Protective Services Involvement: No  Witness to [employer] Violence:  No   Family History:  Family History  Problem Relation Age of Onset   Alzheimer's disease Mother    Heart disease Father        pacer   Cancer Father        dermatologic   Cancer Maternal Aunt        uterine   Alzheimer's disease Maternal  Aunt    Diabetes Neg Hx    Stroke Neg Hx    Hypertension Neg Hx    Colon cancer Neg Hx    Throat cancer Neg Hx    Rectal cancer Neg Hx    Pancreatic cancer Neg Hx     Living situation: the patient lives with her partner  Pt is youngest of five kids.  Father was a functional alcoholic.  No abuse . Parents were married but not a happy marriage.   Pt's older sister passed away.   Brother is recovering drug addict.  He has been a support for pt recently.  Pt has rekindled relationships with brother and sister and niece and nephew.   Parents have been deceased for several years.    Sexual Orientation: Straight  Relationship Status: divorced  Name  of spouse / other:Mark If a parent, number of children / ages:pt has a young adult son and a young adult daughter   Support Systems: significant other friends  Financial Stress:  No   Income/Employment/Disability: Employment Pt is a Runner, broadcasting/film/video.   Military Service: No   Educational History: Education: Risk manager: Catholic  Any cultural differences that may affect / interfere with treatment:  not applicable   Recreation/Hobbies: pt likes to take walks and exercise.   Stressors: Marital or family conflict   Occupational concerns    Strengths: Supportive Relationships, Hopefulness, Self Advocate, and Able to Communicate Effectively  Barriers:  none   Legal History: Pending legal issue / charges: The patient has no significant history of legal issues. History of legal issue / charges:  n/a  Medical History/Surgical History: reviewed Past Medical History:  Diagnosis Date   Allergy    feline   Anxiety     Past Surgical History:  Procedure Laterality Date   REFRACTIVE SURGERY  2012   Dr Delaney Meigs   SKIN CANCER EXCISION     right ear lobe   TONSILLECTOMY AND ADENOIDECTOMY      Medications: Current Outpatient Medications  Medication Sig Dispense Refill   azithromycin (ZITHROMAX Z-PAK) 250 MG tablet As directed 6 each 0   acyclovir (ZOVIRAX) 200 MG capsule TAKE 1 CAPSULE (200 MG TOTAL) BY MOUTH 3 (THREE) TIMES DAILY. 270 capsule 1   ALPRAZolam (XANAX) 0.25 MG tablet Take 1 tablet (0.25 mg total) by mouth 2 (two) times daily as needed for anxiety. 20 tablet 0   benzonatate (TESSALON) 100 MG capsule Take 1 capsule (100 mg total) by mouth 3 (three) times daily as needed for cough. 20 capsule 0   cetirizine (ZYRTEC) 10 MG tablet Take 10 mg by mouth daily.     clotrimazole-betamethasone (LOTRISONE) cream Apply 1 application on to the skin 2 (two) times daily. 30 g 0   diclofenac (CATAFLAM) 50 MG tablet Take 1 tablet (50 mg total) by  mouth 3 (three) times daily. 90 tablet 3   estradiol (VIVELLE-DOT) 0.05 MG/24HR patch 1 patch 2 (two) times a week.     estradiol (VIVELLE-DOT) 0.05 MG/24HR patch Place 1 patch (0.05 mg total) onto the skin twice weekly 8 patch 5   fluticasone (FLONASE) 50 MCG/ACT nasal spray Place 2 sprays into both nostrils daily. 16 g 6   Nutritional Supplements (HRT SUPPORT PO) Take by mouth. (Patient not taking: Reported on 06/25/2022)     progesterone (PROMETRIUM) 100 MG capsule Take 100 mg by mouth at bedtime.     progesterone (PROMETRIUM) 100 MG capsule Take 1 capsule (100 mg total) by mouth  at bedtime. 270 capsule 0   No current facility-administered medications for this visit.    Allergies  Allergen Reactions   Amoxicillin Diarrhea    Also had C Diff    Diagnoses:  F43.21  Plan of Care: Recommend ongoing therapy.  Pt participated in setting treatment goals.  Plan to continue to meet monthly.    Treatment Plan  (Treatment Plan Target Date:  04/21/2024) Client Abilities/Strengths  Pt is bright, engaging, and motivated for therapy.   Client Treatment Preferences  Individual therapy.  Client Statement of Needs  Improve coping skills.  Symptoms  Depressed or irritable mood.  Problems Addressed  Unipolar Depression Goals 1. Alleviate depressive symptoms and return to previous level of effective functioning. 2. Appropriately grieve the loss in order to normalize mood and to return to previously adaptive level of functioning. Objective Learn and implement behavioral strategies to overcome depression. Target Date: 2024-04-21 Frequency: Monthly  Progress: 55 Modality: individual  Related Interventions Engage the client in "behavioral activation," increasing his/her activity level and contact with sources of reward, while identifying processes that inhibit activation.  Use behavioral techniques such as instruction, rehearsal, role-playing, role reversal, as needed, to facilitate activity in the  client's daily life; reinforce success. Assist the client in developing skills that increase the likelihood of deriving pleasure from behavioral activation (e.g., assertiveness skills, developing an exercise plan, less internal/more external focus, increased social involvement); reinforce success. Objective Identify important people in life, past and present, and describe the quality, good and poor, of those relationships. Target Date: 2024-04-21 Frequency: Monthly  Progress: 55 Modality: individual  Related Interventions Conduct Interpersonal Therapy beginning with the assessment of the client's "interpersonal inventory" of important past and present relationships; develop a case formulation linking depression to grief, interpersonal role disputes, role transitions, and/or interpersonal deficits). Objective Learn and implement problem-solving and decision-making skills. Target Date: 2024-04-21 Frequency: Monthly  Progress: 55 Modality: individual  Related Interventions Conduct Problem-Solving Therapy using techniques such as psychoeducation, modeling, and role-playing to teach client problem-solving skills (i.e., defining a problem specifically, generating possible solutions, evaluating the pros and cons of each solution, selecting and implementing a plan of action, evaluating the efficacy of the plan, accepting or revising the plan); role-play application of the problem-solving skill to a real life issue. Encourage in the client the development of a positive problem orientation in which problems and solving them are viewed as a natural part of life and not something to be feared, despaired, or avoided. 3. Develop healthy interpersonal relationships that lead to the alleviation and help prevent the relapse of depression. 4. Develop healthy thinking patterns and beliefs about self, others, and the world that lead to the alleviation and help prevent the relapse of depression. 5. Recognize, accept,  and cope with feelings of depression. Diagnosis F43.21  Conditions For Discharge Achievement of treatment goals and objectives  Salomon Fick, LCSW

## 2023-04-24 ENCOUNTER — Other Ambulatory Visit (HOSPITAL_BASED_OUTPATIENT_CLINIC_OR_DEPARTMENT_OTHER): Payer: Self-pay

## 2023-04-24 LAB — HM MAMMOGRAPHY

## 2023-04-24 MED ORDER — ESTRADIOL 0.05 MG/24HR TD PTTW
1.0000 | MEDICATED_PATCH | TRANSDERMAL | 3 refills | Status: DC
  Filled 2023-04-24 – 2023-06-14 (×2): qty 8, 28d supply, fill #0
  Filled 2023-07-11: qty 8, 28d supply, fill #1
  Filled 2023-08-19: qty 8, 28d supply, fill #2
  Filled 2023-09-12: qty 8, 28d supply, fill #3

## 2023-04-24 MED ORDER — PROGESTERONE MICRONIZED 100 MG PO CAPS
100.0000 mg | ORAL_CAPSULE | Freq: Every day | ORAL | 3 refills | Status: DC
Start: 2023-04-24 — End: 2023-12-16
  Filled 2023-04-24: qty 29, 29d supply, fill #0
  Filled 2023-04-24: qty 1, 1d supply, fill #0
  Filled 2023-06-09: qty 30, 30d supply, fill #1
  Filled 2023-07-11 (×2): qty 30, 30d supply, fill #2
  Filled 2023-08-19: qty 30, 30d supply, fill #3
  Filled 2023-10-07: qty 30, 30d supply, fill #4
  Filled 2023-11-11: qty 30, 30d supply, fill #5

## 2023-05-19 ENCOUNTER — Other Ambulatory Visit (HOSPITAL_BASED_OUTPATIENT_CLINIC_OR_DEPARTMENT_OTHER): Payer: Self-pay

## 2023-06-03 ENCOUNTER — Ambulatory Visit (INDEPENDENT_AMBULATORY_CARE_PROVIDER_SITE_OTHER): Payer: 59 | Admitting: Psychology

## 2023-06-03 DIAGNOSIS — F4321 Adjustment disorder with depressed mood: Secondary | ICD-10-CM

## 2023-06-03 NOTE — Progress Notes (Signed)
[facility] Counselor/Therapist Progress Note  Patient ID: Adriana Lopez, MRN: [id number],    Date: 06/03/2023  Time Spent: 4:00pm-4:55pm   55 minutes   Treatment Type: Individual Therapy  Reported Symptoms: stress  Mental Status Exam: Appearance:  Casual     Behavior: Appropriate  Motor: Normal  Speech/Language:  Normal Rate  Affect: Appropriate  Mood: normal  Thought process: normal  Thought content:   WNL  Sensory/Perceptual disturbances:   WNL  Orientation: oriented to person, place, time/date, and situation  Attention: Good  Concentration: Good  Memory: WNL  Fund of knowledge:  Good  Insight:   Good  Judgment:  Good  Impulse Control: Good   Risk Assessment: Danger to Self:  No Self-injurious Behavior: No Danger to Others: No Duty to Warn:no Physical Aggression / Violence:No  Access to Firearms a concern: No  Gang Involvement:No   Subjective: Pt present for face-to-face individual therapy via video.  Pt consents to telehealth video session and is aware of limitations of virtual sessions. Location of pt: home Location of therapist: home office.   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   She feels like the summer break was too short.   Pt got a one year old dog this summer.  She loves him but he is a lot of work.  She is working on training him.   Pt talked about feeling down most of the summer.   She was not motivated to do much.  She feels like it has lifted lately.  Addressed what has triggered pt's depression.   She thinks family issues have contributed.  Pt talked about her daughter Ryan's baby.   Pt states she has fallen in love with the baby and is relieved that [name] is being a good mom.  Pt talked about her relationship with her boyfriend [name].   She has felt disappointed with him regarding some issues.   They no longer talk about getting married since pt told him she would want a ring last year.  They have been  together for 4 years. Addressed how pt can communicate her concerns about the relationship to [facility].  Pt talked about being upset about the relationship between her son and daughter.   [name] has reached out to [employer] [place] but he has not responded.   Helped pt process her feelings and relationship dynamics.  Provided supportive therapy.  Interventions: Cognitive Behavioral Therapy and Insight-Oriented  Diagnosis: F43.21  Plan of Care: Recommend ongoing therapy.  Pt participated in setting treatment goals.  Plan to continue to meet monthly.    Treatment Plan  (Treatment Plan Target Date:  04/21/2024) Client Abilities/Strengths  Pt is bright, engaging, and motivated for therapy.   Client Treatment Preferences  Individual therapy.  Client Statement of Needs  Improve coping skills.  Symptoms  Depressed or irritable mood.  Problems Addressed  Unipolar Depression Goals 1. Alleviate depressive symptoms and return to previous level of effective functioning. 2. Appropriately grieve the loss in order to normalize mood and to return to previously adaptive level of functioning. Objective Learn and implement behavioral strategies to overcome depression. Target Date: 2024-04-21 Frequency: Monthly  Progress: 55 Modality: individual  Related Interventions Engage the client in "behavioral activation," increasing his/her activity level and contact with sources of reward, while identifying processes that inhibit activation.  Use behavioral techniques such as instruction, rehearsal, role-playing, role reversal, as needed, to facilitate activity in the client's daily life; reinforce success. Assist  the client in developing skills that increase the likelihood of deriving pleasure from behavioral activation (e.g., assertiveness skills, developing an exercise plan, less internal/more external focus, increased social involvement); reinforce success. Objective Identify important people in life, past and present, and  describe the quality, good and poor, of those relationships. Target Date: 2024-04-21 Frequency: Monthly  Progress: 55 Modality: individual  Related Interventions Conduct Interpersonal Therapy beginning with the assessment of the client's "interpersonal inventory" of important past and present relationships; develop a case formulation linking depression to grief, interpersonal role disputes, role transitions, and/or interpersonal deficits). Objective Learn and implement problem-solving and decision-making skills. Target Date: 2024-04-21 Frequency: Monthly  Progress: 55 Modality: individual  Related Interventions Conduct Problem-Solving Therapy using techniques such as psychoeducation, modeling, and role-playing to teach client problem-solving skills (i.e., defining a problem specifically, generating possible solutions, evaluating the pros and cons of each solution, selecting and implementing a plan of action, evaluating the efficacy of the plan, accepting or revising the plan); role-play application of the problem-solving skill to a real life issue. Encourage in the client the development of a positive problem orientation in which problems and solving them are viewed as a natural part of life and not something to be feared, despaired, or avoided. 3. Develop healthy interpersonal relationships that lead to the alleviation and help prevent the relapse of depression. 4. Develop healthy thinking patterns and beliefs about self, others, and the world that lead to the alleviation and help prevent the relapse of depression. 5. Recognize, accept, and cope with feelings of depression. Diagnosis F43.21  Conditions For Discharge Achievement of treatment goals and objectives   Salomon Fick, LCSW

## 2023-06-09 ENCOUNTER — Other Ambulatory Visit (HOSPITAL_BASED_OUTPATIENT_CLINIC_OR_DEPARTMENT_OTHER): Payer: Self-pay

## 2023-06-16 ENCOUNTER — Other Ambulatory Visit (HOSPITAL_BASED_OUTPATIENT_CLINIC_OR_DEPARTMENT_OTHER): Payer: Self-pay

## 2023-07-01 ENCOUNTER — Ambulatory Visit (INDEPENDENT_AMBULATORY_CARE_PROVIDER_SITE_OTHER): Payer: BC Managed Care – PPO | Admitting: Psychology

## 2023-07-01 DIAGNOSIS — F4321 Adjustment disorder with depressed mood: Secondary | ICD-10-CM

## 2023-07-01 NOTE — Progress Notes (Signed)
[facility] Counselor/Therapist Progress Note  Patient ID: Adriana Lopez, MRN: [id number],    Date: 07/01/2023  Time Spent: 5:00pm-5:55pm   55 minutes   Treatment Type: Individual Therapy  Reported Symptoms: stress  Mental Status Exam: Appearance:  Casual     Behavior: Appropriate  Motor: Normal  Speech/Language:  Normal Rate  Affect: Appropriate  Mood: normal  Thought process: normal  Thought content:   WNL  Sensory/Perceptual disturbances:   WNL  Orientation: oriented to person, place, time/date, and situation  Attention: Good  Concentration: Good  Memory: WNL  Fund of knowledge:  Good  Insight:   Good  Judgment:  Good  Impulse Control: Good   Risk Assessment: Danger to Self:  No Self-injurious Behavior: No Danger to Others: No Duty to Warn:no Physical Aggression / Violence:No  Access to Firearms a concern: No  Gang Involvement:No   Subjective: Pt present for face-to-face individual therapy via video.  Pt consents to telehealth video session and is aware of limitations and benefits of virtual sessions. Location of pt: home Location of therapist: home office.   Pt talked about her new dog.   She is working on training him.  He has a lot of energy but is very loving as well. Pt talked about feeling down about the end of summer and the pool closing.  She also feels down bc of some relationship issues with her daughter and with [name].  Pt talked about work.  She has been back to school for a month.   She feels tired a lot.    Pt talked about her relationship with [name].   Pt feels in limbo with the relationship.  She has several issues she needs to talk with [name] about.  They need to talk about issues with finances and about how [name] interacts with her children.  Addressed how pt can effectively communicate issues with [name].  Provided supportive therapy.  Interventions: Cognitive Behavioral Therapy and Insight-Oriented  Diagnosis: F43.21  Plan of  Care: Recommend ongoing therapy.  Pt participated in setting treatment goals.  Plan to continue to meet monthly.    Treatment Plan  (Treatment Plan Target Date:  04/21/2024) Client Abilities/Strengths  Pt is bright, engaging, and motivated for therapy.   Client Treatment Preferences  Individual therapy.  Client Statement of Needs  Improve coping skills.  Symptoms  Depressed or irritable mood.  Problems Addressed  Unipolar Depression Goals 1. Alleviate depressive symptoms and return to previous level of effective functioning. 2. Appropriately grieve the loss in order to normalize mood and to return to previously adaptive level of functioning. Objective Learn and implement behavioral strategies to overcome depression. Target Date: 2024-04-21 Frequency: Monthly  Progress: 55 Modality: individual  Related Interventions Engage the client in "behavioral activation," increasing his/her activity level and contact with sources of reward, while identifying processes that inhibit activation.  Use behavioral techniques such as instruction, rehearsal, role-playing, role reversal, as needed, to facilitate activity in the client's daily life; reinforce success. Assist the client in developing skills that increase the likelihood of deriving pleasure from behavioral activation (e.g., assertiveness skills, developing an exercise plan, less internal/more external focus, increased social involvement); reinforce success. Objective Identify important people in life, past and present, and describe the quality, good and poor, of those relationships. Target Date: 2024-04-21 Frequency: Monthly  Progress: 55 Modality: individual  Related Interventions Conduct Interpersonal Therapy beginning with the assessment of the client's "interpersonal inventory" of important past and present relationships; develop a case formulation  linking depression to grief, interpersonal role disputes, role transitions, and/or  interpersonal deficits). Objective Learn and implement problem-solving and decision-making skills. Target Date: 2024-04-21 Frequency: Monthly  Progress: 55 Modality: individual  Related Interventions Conduct Problem-Solving Therapy using techniques such as psychoeducation, modeling, and role-playing to teach client problem-solving skills (i.e., defining a problem specifically, generating possible solutions, evaluating the pros and cons of each solution, selecting and implementing a plan of action, evaluating the efficacy of the plan, accepting or revising the plan); role-play application of the problem-solving skill to a real life issue. Encourage in the client the development of a positive problem orientation in which problems and solving them are viewed as a natural part of life and not something to be feared, despaired, or avoided. 3. Develop healthy interpersonal relationships that lead to the alleviation and help prevent the relapse of depression. 4. Develop healthy thinking patterns and beliefs about self, others, and the world that lead to the alleviation and help prevent the relapse of depression. 5. Recognize, accept, and cope with feelings of depression. Diagnosis F43.21  Conditions For Discharge Achievement of treatment goals and objectives   Salomon Fick, LCSW

## 2023-07-11 ENCOUNTER — Other Ambulatory Visit: Payer: Self-pay

## 2023-07-11 ENCOUNTER — Other Ambulatory Visit (HOSPITAL_BASED_OUTPATIENT_CLINIC_OR_DEPARTMENT_OTHER): Payer: Self-pay

## 2023-07-24 ENCOUNTER — Other Ambulatory Visit (HOSPITAL_BASED_OUTPATIENT_CLINIC_OR_DEPARTMENT_OTHER): Payer: Self-pay

## 2023-07-24 MED ORDER — CLOTRIMAZOLE-BETAMETHASONE 1-0.05 % EX CREA
1.0000 | TOPICAL_CREAM | Freq: Two times a day (BID) | CUTANEOUS | 5 refills | Status: AC
  Filled 2023-07-24: qty 45, 30d supply, fill #0
  Filled 2023-12-25: qty 45, 30d supply, fill #1

## 2023-07-31 ENCOUNTER — Ambulatory Visit: Payer: BC Managed Care – PPO | Admitting: Psychology

## 2023-07-31 DIAGNOSIS — F4321 Adjustment disorder with depressed mood: Secondary | ICD-10-CM | POA: Diagnosis not present

## 2023-07-31 NOTE — Progress Notes (Signed)
[facility] Counselor/Therapist Progress Note  Patient ID: Adriana Lopez, MRN: [id number],    Date: 07/31/2023  Time Spent: 5:00pm-5:55pm   55 minutes   Treatment Type: Individual Therapy  Reported Symptoms: stress  Mental Status Exam: Appearance:  Casual     Behavior: Appropriate  Motor: Normal  Speech/Language:  Normal Rate  Affect: Appropriate  Mood: normal  Thought process: normal  Thought content:   WNL  Sensory/Perceptual disturbances:   WNL  Orientation: oriented to person, place, time/date, and situation  Attention: Good  Concentration: Good  Memory: WNL  Fund of knowledge:  Good  Insight:   Good  Judgment:  Good  Impulse Control: Good   Risk Assessment: Danger to Self:  No Self-injurious Behavior: No Danger to Others: No Duty to Warn:no Physical Aggression / Violence:No  Access to Firearms a concern: No  Gang Involvement:No   Subjective: Pt present for face-to-face individual therapy via video.  Pt consents to telehealth video session and is aware of limitations and benefits of virtual sessions. Location of pt: home Location of therapist: home office.  Pt talked about her relationship with Adriana Lopez.   Pt had a talk with Adriana Lopez about their future.  He reassured her that he wants a future with her and wants to marry her.   His finances are what keep him from getting her a ring.   Pt has not talked with Adriana Lopez yet about her concerns about finances.   She would like for him to contribute more.  Addressed how pt can effectively communicate issues with Adriana Lopez.  Pt talked about her kids.   She has babysat for her daughter's baby and it has gone well.   She has had to set some boundaries with her daughter when she tries to rely on pt for doctors appointments, etc.    Pt is concerned about her son bc he is still living with his father and girlfriend and both are drug addicts.  Adriana Lopez has asked pt's son for money for bills.  Addressed pt's concerns and how she can be  supportive to her son.   Provided supportive therapy.  Interventions: Cognitive Behavioral Therapy and Insight-Oriented  Diagnosis: F43.21  Plan of Care: Recommend ongoing therapy.  Pt participated in setting treatment goals.  Plan to continue to meet monthly.    Treatment Plan  (Treatment Plan Target Date:  04/21/2024) Client Abilities/Strengths  Pt is bright, engaging, and motivated for therapy.   Client Treatment Preferences  Individual therapy.  Client Statement of Needs  Improve coping skills.  Symptoms  Depressed or irritable mood.  Problems Addressed  Unipolar Depression Goals 1. Alleviate depressive symptoms and return to previous level of effective functioning. 2. Appropriately grieve the loss in order to normalize mood and to return to previously adaptive level of functioning. Objective Learn and implement behavioral strategies to overcome depression. Target Date: 2024-04-21 Frequency: Monthly  Progress: 55 Modality: individual  Related Interventions Engage the client in "behavioral activation," increasing his/her activity level and contact with sources of reward, while identifying processes that inhibit activation.  Use behavioral techniques such as instruction, rehearsal, role-playing, role reversal, as needed, to facilitate activity in the client's daily life; reinforce success. Assist the client in developing skills that increase the likelihood of deriving pleasure from behavioral activation (e.g., assertiveness skills, developing an exercise plan, less internal/more external focus, increased social involvement); reinforce success. Objective Identify important people in life, past and present, and describe the quality, good and poor, of those relationships. Target  Date: 2024-04-21 Frequency: Monthly  Progress: 55 Modality: individual  Related Interventions Conduct Interpersonal Therapy beginning with the assessment of the client's "interpersonal inventory" of  important past and present relationships; develop a case formulation linking depression to grief, interpersonal role disputes, role transitions, and/or interpersonal deficits). Objective Learn and implement problem-solving and decision-making skills. Target Date: 2024-04-21 Frequency: Monthly  Progress: 55 Modality: individual  Related Interventions Conduct Problem-Solving Therapy using techniques such as psychoeducation, modeling, and role-playing to teach client problem-solving skills (i.e., defining a problem specifically, generating possible solutions, evaluating the pros and cons of each solution, selecting and implementing a plan of action, evaluating the efficacy of the plan, accepting or revising the plan); role-play application of the problem-solving skill to a real life issue. Encourage in the client the development of a positive problem orientation in which problems and solving them are viewed as a natural part of life and not something to be feared, despaired, or avoided. 3. Develop healthy interpersonal relationships that lead to the alleviation and help prevent the relapse of depression. 4. Develop healthy thinking patterns and beliefs about self, others, and the world that lead to the alleviation and help prevent the relapse of depression. 5. Recognize, accept, and cope with feelings of depression. Diagnosis F43.21  Conditions For Discharge Achievement of treatment goals and objectives   Salomon Fick, LCSW

## 2023-08-20 ENCOUNTER — Other Ambulatory Visit (HOSPITAL_BASED_OUTPATIENT_CLINIC_OR_DEPARTMENT_OTHER): Payer: Self-pay

## 2023-08-20 MED ORDER — CLINDAMYCIN HCL 150 MG PO CAPS
300.0000 mg | ORAL_CAPSULE | Freq: Three times a day (TID) | ORAL | 0 refills | Status: DC
  Filled 2023-08-20: qty 22, 4d supply, fill #0

## 2023-08-21 ENCOUNTER — Other Ambulatory Visit (HOSPITAL_BASED_OUTPATIENT_CLINIC_OR_DEPARTMENT_OTHER): Payer: Self-pay

## 2023-08-21 MED ORDER — VALACYCLOVIR HCL 500 MG PO TABS
500.0000 mg | ORAL_TABLET | Freq: Two times a day (BID) | ORAL | 1 refills | Status: DC
  Filled 2023-08-21: qty 4, 2d supply, fill #0
  Filled 2023-09-12: qty 4, 2d supply, fill #1

## 2023-09-01 ENCOUNTER — Ambulatory Visit: Payer: BC Managed Care – PPO | Admitting: Psychology

## 2023-09-03 ENCOUNTER — Other Ambulatory Visit (HOSPITAL_BASED_OUTPATIENT_CLINIC_OR_DEPARTMENT_OTHER): Payer: Self-pay

## 2023-09-03 ENCOUNTER — Other Ambulatory Visit: Payer: Self-pay

## 2023-09-03 MED ORDER — TRIAZOLAM 0.25 MG PO TABS
0.2500 mg | ORAL_TABLET | Freq: Every day | ORAL | 0 refills | Status: AC
  Filled 2023-09-03: qty 3, 3d supply, fill #0

## 2023-09-08 ENCOUNTER — Ambulatory Visit: Payer: BC Managed Care – PPO | Admitting: Psychology

## 2023-09-08 DIAGNOSIS — F4321 Adjustment disorder with depressed mood: Secondary | ICD-10-CM

## 2023-09-08 NOTE — Progress Notes (Signed)
[facility] Counselor/Therapist Progress Note  Patient ID: Adriana Lopez, MRN: [id number],    Date: 09/08/2023  Time Spent: 4:00pm-4:55pm   55 minutes   Treatment Type: Individual Therapy  Reported Symptoms: stress  Mental Status Exam: Appearance:  Casual     Behavior: Appropriate  Motor: Normal  Speech/Language:  Normal Rate  Affect: Appropriate  Mood: normal  Thought process: normal  Thought content:   WNL  Sensory/Perceptual disturbances:   WNL  Orientation: oriented to person, place, time/date, and situation  Attention: Good  Concentration: Good  Memory: WNL  Fund of knowledge:  Good  Insight:   Good  Judgment:  Good  Impulse Control: Good   Risk Assessment: Danger to Self:  No Self-injurious Behavior: No Danger to Others: No Duty to Warn:no Physical Aggression / Violence:No  Access to Firearms a concern: No  Gang Involvement:No   Subjective: Pt present for face-to-face individual therapy via video.  Pt consents to telehealth video session and is aware of limitations and benefits of virtual sessions. Location of pt: home Location of therapist: home office.  Pt talked about her best friend [name].   [name] has been in the hospital bc she had an aneurism and strokes.   Pt went to [facility] to be with her friend.  [name] is only 50 years old and may not survive.   Pt is devastated at the thought of losing her very dear friend.   Helped pt process her feelings and grief.   Pt talked about her exhusband [name] and their kids Gregary Signs and L[place].   [name] has run out of money bc of his addiction and Gregary Signs is needing to look for a new place to live.   Ryan enables [place] by giving him money and being in denial about [name]' addiction.   Pt often feels put in the middle of those dynamics.   Helped pt process the family dynamics. Pt talked about her relationship with [name].  He just got a new job that pays a lot more.   Pt feels like their relationship is better.  Worked on self  care strategies. Provided supportive therapy.  Interventions: Cognitive Behavioral Therapy and Insight-Oriented  Diagnosis: F43.21  Plan of Care: Recommend ongoing therapy.  Pt participated in setting treatment goals.  Plan to continue to meet monthly.    Treatment Plan  (Treatment Plan Target Date:  04/21/2024) Client Abilities/Strengths  Pt is bright, engaging, and motivated for therapy.   Client Treatment Preferences  Individual therapy.  Client Statement of Needs  Improve coping skills.  Symptoms  Depressed or irritable mood.  Problems Addressed  Unipolar Depression Goals 1. Alleviate depressive symptoms and return to previous level of effective functioning. 2. Appropriately grieve the loss in order to normalize mood and to return to previously adaptive level of functioning. Objective Learn and implement behavioral strategies to overcome depression. Target Date: 2024-04-21 Frequency: Monthly  Progress: 55 Modality: individual  Related Interventions Engage the client in "behavioral activation," increasing his/her activity level and contact with sources of reward, while identifying processes that inhibit activation.  Use behavioral techniques such as instruction, rehearsal, role-playing, role reversal, as needed, to facilitate activity in the client's daily life; reinforce success. Assist the client in developing skills that increase the likelihood of deriving pleasure from behavioral activation (e.g., assertiveness skills, developing an exercise plan, less internal/more external focus, increased social involvement); reinforce success. Objective Identify important people in life, past and present, and describe the quality, good and poor, of those  relationships. Target Date: 2024-04-21 Frequency: Monthly  Progress: 55 Modality: individual  Related Interventions Conduct Interpersonal Therapy beginning with the assessment of the client's "interpersonal inventory" of important past  and present relationships; develop a case formulation linking depression to grief, interpersonal role disputes, role transitions, and/or interpersonal deficits). Objective Learn and implement problem-solving and decision-making skills. Target Date: 2024-04-21 Frequency: Monthly  Progress: 55 Modality: individual  Related Interventions Conduct Problem-Solving Therapy using techniques such as psychoeducation, modeling, and role-playing to teach client problem-solving skills (i.e., defining a problem specifically, generating possible solutions, evaluating the pros and cons of each solution, selecting and implementing a plan of action, evaluating the efficacy of the plan, accepting or revising the plan); role-play application of the problem-solving skill to a real life issue. Encourage in the client the development of a positive problem orientation in which problems and solving them are viewed as a natural part of life and not something to be feared, despaired, or avoided. 3. Develop healthy interpersonal relationships that lead to the alleviation and help prevent the relapse of depression. 4. Develop healthy thinking patterns and beliefs about self, others, and the world that lead to the alleviation and help prevent the relapse of depression. 5. Recognize, accept, and cope with feelings of depression. Diagnosis F43.21  Conditions For Discharge Achievement of treatment goals and objectives   Salomon Fick, LCSW

## 2023-09-30 ENCOUNTER — Ambulatory Visit: Payer: BC Managed Care – PPO | Admitting: Psychology

## 2023-09-30 DIAGNOSIS — F4321 Adjustment disorder with depressed mood: Secondary | ICD-10-CM | POA: Diagnosis not present

## 2023-09-30 NOTE — Progress Notes (Signed)
[facility] Counselor/Therapist Progress Note  Patient ID: Adriana Lopez, MRN: [id number],    Date: 09/30/2023  Time Spent: 5:00pm-5:55pm   55 minutes   Treatment Type: Individual Therapy  Reported Symptoms: stress  Mental Status Exam: Appearance:  Casual     Behavior: Appropriate  Motor: Normal  Speech/Language:  Normal Rate  Affect: Appropriate  Mood: normal  Thought process: normal  Thought content:   WNL  Sensory/Perceptual disturbances:   WNL  Orientation: oriented to person, place, time/date, and situation  Attention: Good  Concentration: Good  Memory: WNL  Fund of knowledge:  Good  Insight:   Good  Judgment:  Good  Impulse Control: Good   Risk Assessment: Danger to Self:  No Self-injurious Behavior: No Danger to Others: No Duty to Warn:no Physical Aggression / Violence:No  Access to Firearms a concern: No  Gang Involvement:No   Subjective: Pt present for face-to-face individual therapy via video.  Pt consents to telehealth video session and is aware of limitations and benefits of virtual sessions. Location of pt: home Location of therapist: home office.  Pt talked about her best friend Adriana Braun.   Adriana Braun died 09-20-23.   Pt is grieving.  She misses Adriana Braun so much.  Helped pt process her feelings and grief. Pt talked about Thanksgiving.  Her daughter Adriana Lopez was explosive to pt and said some hurtful things.   This was very upsetting to pt.  Addressed the incident and helped pt process her feelings and relationship dynamics.  Adriana Lopez is very demanding and does not consider how much she hurts people.  Worked on self care strategies. Provided supportive therapy.  Interventions: Cognitive Behavioral Therapy and Insight-Oriented  Diagnosis: F43.21  Plan of Care: Recommend ongoing therapy.  Pt participated in setting treatment goals.  Plan to continue to meet monthly.    Treatment Plan  (Treatment Plan Target Date:  04/21/2024) Client  Abilities/Strengths  Pt is bright, engaging, and motivated for therapy.   Client Treatment Preferences  Individual therapy.  Client Statement of Needs  Improve coping skills.  Symptoms  Depressed or irritable mood.  Problems Addressed  Unipolar Depression Goals 1. Alleviate depressive symptoms and return to previous level of effective functioning. 2. Appropriately grieve the loss in order to normalize mood and to return to previously adaptive level of functioning. Objective Learn and implement behavioral strategies to overcome depression. Target Date: 2024-04-21 Frequency: Monthly  Progress: 55 Modality: individual  Related Interventions Engage the client in "behavioral activation," increasing his/her activity level and contact with sources of reward, while identifying processes that inhibit activation.  Use behavioral techniques such as instruction, rehearsal, role-playing, role reversal, as needed, to facilitate activity in the client's daily life; reinforce success. Assist the client in developing skills that increase the likelihood of deriving pleasure from behavioral activation (e.g., assertiveness skills, developing an exercise plan, less internal/more external focus, increased social involvement); reinforce success. Objective Identify important people in life, past and present, and describe the quality, good and poor, of those relationships. Target Date: 2024-04-21 Frequency: Monthly  Progress: 55 Modality: individual  Related Interventions Conduct Interpersonal Therapy beginning with the assessment of the client's "interpersonal inventory" of important past and present relationships; develop a case formulation linking depression to grief, interpersonal role disputes, role transitions, and/or interpersonal deficits). Objective Learn and implement problem-solving and decision-making skills. Target Date: 2024-04-21 Frequency: Monthly  Progress: 55 Modality: individual  Related  Interventions Conduct Problem-Solving Therapy using techniques such as psychoeducation, modeling, and role-playing to teach client problem-solving  skills (i.e., defining a problem specifically, generating possible solutions, evaluating the pros and cons of each solution, selecting and implementing a plan of action, evaluating the efficacy of the plan, accepting or revising the plan); role-play application of the problem-solving skill to a real life issue. Encourage in the client the development of a positive problem orientation in which problems and solving them are viewed as a natural part of life and not something to be feared, despaired, or avoided. 3. Develop healthy interpersonal relationships that lead to the alleviation and help prevent the relapse of depression. 4. Develop healthy thinking patterns and beliefs about self, others, and the world that lead to the alleviation and help prevent the relapse of depression. 5. Recognize, accept, and cope with feelings of depression. Diagnosis F43.21  Conditions For Discharge Achievement of treatment goals and objectives   Adriana Fick, LCSW

## 2023-10-07 ENCOUNTER — Other Ambulatory Visit (HOSPITAL_BASED_OUTPATIENT_CLINIC_OR_DEPARTMENT_OTHER): Payer: Self-pay

## 2023-10-07 ENCOUNTER — Other Ambulatory Visit: Payer: Self-pay

## 2023-10-07 MED ORDER — ESTRADIOL 0.05 MG/24HR TD PTTW
1.0000 | MEDICATED_PATCH | TRANSDERMAL | 2 refills | Status: AC
  Filled 2023-10-07: qty 24, 84d supply, fill #0
  Filled 2024-01-04: qty 24, 84d supply, fill #1
  Filled 2024-07-14: qty 24, 84d supply, fill #2

## 2023-10-30 ENCOUNTER — Ambulatory Visit: Payer: 59 | Admitting: Psychology

## 2023-10-30 DIAGNOSIS — F4321 Adjustment disorder with depressed mood: Secondary | ICD-10-CM

## 2023-10-30 NOTE — Progress Notes (Signed)
 [facility] Counselor/Therapist Progress Note  Patient ID: Adriana Lopez, MRN: [id number],    Date: 10/30/2023  Time Spent: 3:00pm-3:55pm   55 minutes   Treatment Type: Individual Therapy  Reported Symptoms: stress  Mental Status Exam: Appearance:  Casual     Behavior: Appropriate  Motor: Normal  Speech/Language:  Normal Rate  Affect: Appropriate  Mood: normal  Thought process: normal  Thought content:   WNL  Sensory/Perceptual disturbances:   WNL  Orientation: oriented to person, place, time/date, and situation  Attention: Good  Concentration: Good  Memory: WNL  Fund of knowledge:  Good  Insight:   Good  Judgment:  Good  Impulse Control: Good   Risk Assessment: Danger to Self:  No Self-injurious Behavior: No Danger to Others: No Duty to Warn:no Physical Aggression / Violence:No  Access to Firearms a concern: No  Gang Involvement:No   Subjective: Pt present for face-to-face individual therapy via video.  Pt consents to telehealth video session and is aware of limitations and benefits of virtual sessions. Location of pt: home Location of therapist: home office.  Pt talked about her holidays.  She states Christmas was not good bc of issues with her daughter Adriana Lopez who can be very explosive and volatile.   Pt's son Adriana Lopez refused to come to Christmas.   This hurt pt a lot.   Adriana Lopez has withdrawn from pt and she misses him.   Helped pt process her feelings and family dynamics.   Ryan asked pt to take care of her baby every other weekend.  Pt feels like this is a big ask.  She has to consider Adriana Lopez in the decision and there is a lot of tension btw Adriana Lopez.   Pt is still grieving the death of her best friend Adriana Lopez.  Helped pt process her grief.   Worked on self care strategies. Provided supportive therapy.  Interventions: Cognitive Behavioral Therapy and Insight-Oriented  Diagnosis: F43.21  Plan of Care: Recommend ongoing therapy.  Pt participated in  setting treatment goals.  Plan to continue to meet monthly.    Treatment Plan  (Treatment Plan Target Date:  04/21/2024) Client Abilities/Strengths  Pt is bright, engaging, and motivated for therapy.   Client Treatment Preferences  Individual therapy.  Client Statement of Needs  Improve coping skills.  Symptoms  Depressed or irritable mood.  Problems Addressed  Unipolar Depression Goals 1. Alleviate depressive symptoms and return to previous level of effective functioning. 2. Appropriately grieve the loss in order to normalize mood and to return to previously adaptive level of functioning. Objective Learn and implement behavioral strategies to overcome depression. Target Date: 2024-04-21 Frequency: Monthly  Progress: 55 Modality: individual  Related Interventions Engage the client in behavioral activation, increasing his/her activity level and contact with sources of reward, while identifying processes that inhibit activation.  Use behavioral techniques such as instruction, rehearsal, role-playing, role reversal, as needed, to facilitate activity in the client's daily life; reinforce success. Assist the client in developing skills that increase the likelihood of deriving pleasure from behavioral activation (e.g., assertiveness skills, developing an exercise plan, less internal/more external focus, increased social involvement); reinforce success. Objective Identify important people in life, past and present, and describe the quality, good and poor, of those relationships. Target Date: 2024-04-21 Frequency: Monthly  Progress: 55 Modality: individual  Related Interventions Conduct Interpersonal Therapy beginning with the assessment of the client's interpersonal inventory of important past and present relationships; develop a case formulation linking depression to grief, interpersonal role  disputes, role transitions, and/or interpersonal deficits). Objective Learn and implement  problem-solving and decision-making skills. Target Date: 2024-04-21 Frequency: Monthly  Progress: 55 Modality: individual  Related Interventions Conduct Problem-Solving Therapy using techniques such as psychoeducation, modeling, and role-playing to teach client problem-solving skills (i.e., defining a problem specifically, generating possible solutions, evaluating the pros and cons of each solution, selecting and implementing a plan of action, evaluating the efficacy of the plan, accepting or revising the plan); role-play application of the problem-solving skill to a real life issue. Encourage in the client the development of a positive problem orientation in which problems and solving them are viewed as a natural part of life and not something to be feared, despaired, or avoided. 3. Develop healthy interpersonal relationships that lead to the alleviation and help prevent the relapse of depression. 4. Develop healthy thinking patterns and beliefs about self, others, and the world that lead to the alleviation and help prevent the relapse of depression. 5. Recognize, accept, and cope with feelings of depression. Diagnosis F43.21  Conditions For Discharge Achievement of treatment goals and objectives   [name], LCSW

## 2023-11-27 ENCOUNTER — Ambulatory Visit: Payer: 59 | Admitting: Psychology

## 2023-11-27 DIAGNOSIS — F4321 Adjustment disorder with depressed mood: Secondary | ICD-10-CM | POA: Diagnosis not present

## 2023-11-27 NOTE — Progress Notes (Signed)
[name] Counselor/Therapist Progress Note  Patient ID: Adriana Lopez, MRN: [id number],    Date: 11/27/2023  Time Spent: 4:00pm-4:55pm   55 minutes   Treatment Type: Individual Therapy  Reported Symptoms: stress  Mental Status Exam: Appearance:  Casual     Behavior: Appropriate  Motor: Normal  Speech/Language:  Normal Rate  Affect: Appropriate  Mood: normal  Thought process: normal  Thought content:   WNL  Sensory/Perceptual disturbances:   WNL  Orientation: oriented to person, place, time/date, and situation  Attention: Good  Concentration: Good  Memory: WNL  Fund of knowledge:  Good  Insight:   Good  Judgment:  Good  Impulse Control: Good   Risk Assessment: Danger to Self:  No Self-injurious Behavior: No Danger to Others: No Duty to Warn:no Physical Aggression / Violence:No  Access to Firearms a concern: No  Gang Involvement:No   Subjective: Pt present for face-to-face individual therapy via video.  Pt consents to telehealth video session and is aware of limitations and benefits of virtual sessions. Location of pt: home Location of therapist: home office.  Pt talked about feeling overwhelmed and depressed.   There have been a lot of family issues that have been triggering for pt.   Adriana Lopez is being evicted and is asking Adriana Lopez to live with him and help him financially.  This is very upsetting to pt.   Adriana Lopez is upset with Adriana Lopez for not helping Adriana Lopez out.  Pt feels in the middle of the drama.   She is very upset about how badly her kids treat her and that they do not get along with each other.  Helped pt process her feelings and family dynamics.   Pt talked about Adriana Lopez.  He is not working.  Pt feels the financial burden.   They have had some conflict about the issues with pt's kids. Addressed how pt can communicate her needs to [facility]  Pt is still grieving the death of her best friend Adriana Lopez.  Helped pt process her grief.   Worked on self care  strategies. Provided supportive therapy.  Interventions: Cognitive Behavioral Therapy and Insight-Oriented  Diagnosis: F43.21  Plan of Care: Recommend ongoing therapy.  Pt participated in setting treatment goals.  Plan to continue to meet monthly.    Treatment Plan  (Treatment Plan Target Date:  04/21/2024) Client Abilities/Strengths  Pt is bright, engaging, and motivated for therapy.   Client Treatment Preferences  Individual therapy.  Client Statement of Needs  Improve coping skills.  Symptoms  Depressed or irritable mood.  Problems Addressed  Unipolar Depression Goals 1. Alleviate depressive symptoms and return to previous level of effective functioning. 2. Appropriately grieve the loss in order to normalize mood and to return to previously adaptive level of functioning. Objective Learn and implement behavioral strategies to overcome depression. Target Date: 2024-04-21 Frequency: Monthly  Progress: 55 Modality: individual  Related Interventions Engage the client in "behavioral activation," increasing his/her activity level and contact with sources of reward, while identifying processes that inhibit activation.  Use behavioral techniques such as instruction, rehearsal, role-playing, role reversal, as needed, to facilitate activity in the client's daily life; reinforce success. Assist the client in developing skills that increase the likelihood of deriving pleasure from behavioral activation (e.g., assertiveness skills, developing an exercise plan, less internal/more external focus, increased social involvement); reinforce success. Objective Identify important people in life, past and present, and describe the quality, good and poor, of those relationships. Target Date: 2024-04-21 Frequency: Monthly  Progress: 55 Modality:  individual  Related Interventions Conduct Interpersonal Therapy beginning with the assessment of the client's "interpersonal inventory" of important past and  present relationships; develop a case formulation linking depression to grief, interpersonal role disputes, role transitions, and/or interpersonal deficits). Objective Learn and implement problem-solving and decision-making skills. Target Date: 2024-04-21 Frequency: Monthly  Progress: 55 Modality: individual  Related Interventions Conduct Problem-Solving Therapy using techniques such as psychoeducation, modeling, and role-playing to teach client problem-solving skills (i.e., defining a problem specifically, generating possible solutions, evaluating the pros and cons of each solution, selecting and implementing a plan of action, evaluating the efficacy of the plan, accepting or revising the plan); role-play application of the problem-solving skill to a real life issue. Encourage in the client the development of a positive problem orientation in which problems and solving them are viewed as a natural part of life and not something to be feared, despaired, or avoided. 3. Develop healthy interpersonal relationships that lead to the alleviation and help prevent the relapse of depression. 4. Develop healthy thinking patterns and beliefs about self, others, and the world that lead to the alleviation and help prevent the relapse of depression. 5. Recognize, accept, and cope with feelings of depression. Diagnosis F43.21  Conditions For Discharge Achievement of treatment goals and objectives   Salomon Fick, LCSW

## 2023-12-16 ENCOUNTER — Ambulatory Visit (INDEPENDENT_AMBULATORY_CARE_PROVIDER_SITE_OTHER): Payer: 59 | Admitting: Family Medicine

## 2023-12-16 ENCOUNTER — Other Ambulatory Visit (HOSPITAL_BASED_OUTPATIENT_CLINIC_OR_DEPARTMENT_OTHER): Payer: Self-pay

## 2023-12-16 VITALS — BP 114/56 | HR 70 | Temp 97.9°F | Resp 18 | Ht 64.0 in | Wt 120.8 lb

## 2023-12-16 DIAGNOSIS — F419 Anxiety disorder, unspecified: Secondary | ICD-10-CM

## 2023-12-16 DIAGNOSIS — B001 Herpesviral vesicular dermatitis: Secondary | ICD-10-CM

## 2023-12-16 DIAGNOSIS — J0140 Acute pansinusitis, unspecified: Secondary | ICD-10-CM | POA: Diagnosis not present

## 2023-12-16 MED ORDER — ALPRAZOLAM 0.25 MG PO TABS
0.2500 mg | ORAL_TABLET | Freq: Two times a day (BID) | ORAL | 0 refills | Status: DC | PRN
  Filled 2023-12-16 – 2023-12-17 (×2): qty 20, 10d supply, fill #0

## 2023-12-16 MED ORDER — AZELASTINE HCL 0.1 % NA SOLN
1.0000 | Freq: Two times a day (BID) | NASAL | 12 refills | Status: AC
  Filled 2023-12-16: qty 30, 50d supply, fill #0

## 2023-12-16 MED ORDER — VALACYCLOVIR HCL 500 MG PO TABS
500.0000 mg | ORAL_TABLET | Freq: Two times a day (BID) | ORAL | 1 refills | Status: DC
  Filled 2023-12-16: qty 20, 10d supply, fill #0
  Filled 2024-01-21: qty 20, 10d supply, fill #1

## 2023-12-16 MED ORDER — DOXYCYCLINE HYCLATE 100 MG PO TABS
100.0000 mg | ORAL_TABLET | Freq: Two times a day (BID) | ORAL | 0 refills | Status: DC
Start: 2023-12-16 — End: 2024-01-18
  Filled 2023-12-16: qty 20, 10d supply, fill #0

## 2023-12-16 NOTE — Progress Notes (Unsigned)
Established Patient Office Visit  Subjective   Patient ID: Adriana Lopez, female    DOB: 05/24/1961  Age: 63 y.o. MRN: [id number]  Chief Complaint  Patient presents with   Facial Pain    HPI Discussed the use of AI scribe software for clinical note transcription with the patient, who gave verbal consent to proceed.  History of Present Illness   Adriana Lopez is a 63 year old female who presents with symptoms suggestive of a sinus infection.  For the past two weeks, she has experienced significant nasal congestion and thick, slightly bloody mucus each morning. Initially attributing these symptoms to the weather, she now describes feeling 'very sinusy'. She experiences frontal headaches that have not been relieved by Tylenol. She has not been using her Flonase. No sore throat, cough, or fever is present.  Ocular symptoms include morning crustiness and discharge, primarily in the left eye, with occasional involvement of the right eye. Initially suspecting conjunctivitis, she was unable to find over-the-counter treatments. Clear Eyes provided some relief, but symptoms persist. She has not used allergy-specific eye drops.  She has an allergy to penicillins but believes she can tolerate doxycycline. She is not currently taking any other medications for these symptoms.  She mentions having a boyfriend whose mother is in hospice care. She also notes having a dog, a retriever, which she frequently interacts with, and initially thought a dog hair might have caused her eye symptoms.      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   Basal cell carcinoma 08/18/2012   Acute frontal sinusitis 10/19/2010   Past Medical History:  Diagnosis Date   Allergy    feline   Anxiety    Past Surgical History:  Procedure Laterality Date   REFRACTIVE SURGERY  2012   Dr Delaney Meigs   SKIN CANCER EXCISION     right ear lobe   TONSILLECTOMY AND ADENOIDECTOMY     Social History   Tobacco Use   Smoking status: Former    Types: Cigarettes   Smokeless tobacco: Never  Vaping Use   Vaping status: Never Used  Substance Use Topics   Alcohol use: Yes    Alcohol/week: 7.0 standard drinks of alcohol    Types: 7 Glasses of wine per week   Drug use: No   Social History   Socioeconomic History   Marital status: Divorced    Spouse name: Not on file   Number of children: 2   Years of education: Not on file   Highest education level: Master's degree (e.g., MA, MS, MEng, MEd, MSW, MBA)  Occupational History    Employer: [employer]  Tobacco Use   Smoking status: Former    Types: Cigarettes   Smokeless tobacco: Never  Vaping Use   Vaping status: Never Used  Substance and Sexual Activity   Alcohol use: Yes    Alcohol/week: 7.0 standard drinks of alcohol    Types: 7 Glasses of wine per week   Drug use: No   Sexual activity: Yes  Other Topics Concern   Not on file  Social History Narrative   Exercise- walking  Social Drivers of Corporate investment banker Strain: Low Risk  (12/16/2023)   Overall Financial Resource Strain (CARDIA)    Difficulty of Paying Living Expenses: Not very hard  Food Insecurity: No Food Insecurity (12/16/2023)   Hunger Vital Sign    Worried About Running Out of Food in the Last Year: Never true    Ran Out of Food in the Last Year: Never true  Transportation Needs: No Transportation Needs (12/16/2023)   PRAPARE - Administrator, Civil Service (Medical): No    Lack of Transportation (Non-Medical): No  Physical Activity: Sufficiently Active (12/16/2023)    Exercise Vital Sign    Days of Exercise per Week: 7 days    Minutes of Exercise per Session: 30 min  Stress: No Stress Concern Present (12/16/2023)   [employer] of Occupational Health - Occupational Stress Questionnaire    Feeling of Stress : Only a little  Social Connections: Moderately Isolated (12/16/2023)   Social Connection and Isolation Panel [NHANES]    Frequency of Communication with Friends and Family: More than three times a week    Frequency of Social Gatherings with Friends and Family: Twice a week    Attends Religious Services: 1 to 4 times per year    Active Member of [employer] or Organizations: No    Attends Engineer, structural: Not on file    Marital Status: Divorced  Catering manager Violence: Not on file   Family Status  Relation Name Status   Mother  (Not Specified)   Father  (Not Specified)   Youth worker  (Not Specified)   Mat Aunt  (Not Specified)   Neg Hx  (Not Specified)  No partnership data on file   Family History  Problem Relation Age of Onset   Alzheimer's disease Mother    Heart disease Father        pacer   Cancer Father        dermatologic   Cancer Maternal Aunt        uterine   Alzheimer's disease Maternal Aunt    Diabetes Neg Hx    Stroke Neg Hx    Hypertension Neg Hx    Colon cancer Neg Hx    Throat cancer Neg Hx    Rectal cancer Neg Hx    Pancreatic cancer Neg Hx    Allergies  Allergen Reactions   Amoxicillin Diarrhea    Also had C Diff      Review of Systems  Constitutional:  Negative for fever and malaise/fatigue.  HENT:  Positive for congestion and sinus pain.   Eyes:  Negative for blurred vision.  Respiratory:  Negative for cough and shortness of breath.   Cardiovascular:  Negative for chest pain, palpitations and leg swelling.  Gastrointestinal:  Negative for abdominal pain, blood in stool and nausea.  Genitourinary:  Negative for dysuria and frequency.  Musculoskeletal:  Negative for falls.  Skin:  Negative for  rash.  Neurological:  Negative for dizziness, loss of consciousness and headaches.  Endo/Heme/Allergies:  Negative for environmental allergies.  Psychiatric/Behavioral:  Negative for depression. The patient is not nervous/anxious.       Objective:     BP (!) 114/56   Pulse 70   Temp 97.9 F (36.6 C)   Resp 18   Ht 5\' 4"  (1.626 m)   Wt 120 lb 12.8 oz (54.8 kg)   SpO2 100%   BMI 20.74 kg/m  BP Readings from Last 3 Encounters:  12/16/23 Marland Kitchen)  114/56  06/25/22 104/60  04/26/22 120/68   Wt Readings from Last 3 Encounters:  12/16/23 120 lb 12.8 oz (54.8 kg)  06/25/22 120 lb (54.4 kg)  04/26/22 119 lb 9.6 oz (54.3 kg)   SpO2 Readings from Last 3 Encounters:  12/16/23 100%  06/25/22 98%  04/26/22 98%      Physical Exam Vitals and nursing note reviewed.  Constitutional:      General: She is not in acute distress.    Appearance: Normal appearance. She is well-developed.  HENT:     Head: Normocephalic and atraumatic.     Nose:     Right Sinus: Maxillary sinus tenderness and frontal sinus tenderness present.     Left Sinus: Maxillary sinus tenderness and frontal sinus tenderness present.  Eyes:     General: No scleral icterus.       Right eye: No discharge.        Left eye: No discharge.  Cardiovascular:     Rate and Rhythm: Normal rate and regular rhythm.     Heart sounds: No murmur heard. Pulmonary:     Effort: Pulmonary effort is normal. No respiratory distress.     Breath sounds: Normal breath sounds.  Musculoskeletal:        General: Normal range of motion.     Cervical back: Normal range of motion and neck supple.     Right lower leg: No edema.     Left lower leg: No edema.  Skin:    General: Skin is warm and dry.  Neurological:     Mental Status: She is alert and oriented to person, place, and time.  Psychiatric:        Mood and Affect: Mood normal.        Behavior: Behavior normal.        Thought Content: Thought content normal.        Judgment:  Judgment normal.      No results found for any visits on 12/16/23.  Last CBC Lab Results  Component Value Date   WBC 7.5 04/26/2022   HGB 13.4 04/26/2022   HCT 39.5 04/26/2022   MCV 99.0 04/26/2022   MCH 33.6 (H) 04/26/2022   RDW 11.8 04/26/2022   PLT 337 04/26/2022   Last metabolic panel Lab Results  Component Value Date   GLUCOSE 82 04/26/2022   NA 138 04/26/2022   K 4.0 04/26/2022   CL 102 04/26/2022   CO2 25 04/26/2022   BUN 18 04/26/2022   CREATININE 0.68 04/26/2022   GFR 94.97 05/01/2021   CALCIUM 9.0 04/26/2022   PROT 6.5 04/26/2022   ALBUMIN 4.2 05/01/2021   BILITOT 0.9 04/26/2022   ALKPHOS 52 05/01/2021   AST 17 04/26/2022   ALT 8 04/26/2022   Last lipids Lab Results  Component Value Date   CHOL 203 (H) 04/26/2022   HDL 116 04/26/2022   LDLCALC 76 04/26/2022   TRIG 39 04/26/2022   CHOLHDL 1.8 04/26/2022   Last hemoglobin A1c No results found for: "HGBA1C" Last thyroid functions Lab Results  Component Value Date   TSH 2.00 04/26/2022   Last vitamin D No results found for: "25OHVITD2", "25OHVITD3", "VD25OH" Last vitamin B12 and Folate No results found for: "VITAMINB12", "FOLATE"    The ASCVD Risk score (Arnett DK, et al., 2019) failed to calculate for the following reasons:   The valid HDL cholesterol range is 20 to 100 mg/dL    Assessment & Plan:   Problem List Items Addressed This  Visit       Unprioritized   Anxiety   Relevant Medications   ALPRAZolam (XANAX) 0.25 MG tablet   Cold sore   Relevant Medications   ALPRAZolam (XANAX) 0.25 MG tablet   valACYclovir (VALTREX) 500 MG tablet   Acute non-recurrent pansinusitis - Primary   Abx per orders Nasal steroid and antihistamine       Relevant Medications   doxycycline (VIBRA-TABS) 100 MG tablet   azelastine (ASTELIN) 0.1 % nasal spray   valACYclovir (VALTREX) 500 MG tablet  Assessment and Plan    Sinusitis Symptoms include morning crusty eye discharge, congestion, thick and  occasionally bloody mucus, and frontal headache for two weeks. No fever or sore throat. Differential diagnosis considered allergic rhinitis, but presentation aligns more with sinusitis. Discussed risks of untreated sinusitis, such as chronic sinusitis or infection spread, and benefits of treatment for symptom relief and complication prevention. Alternatives include symptomatic treatment, but antibiotics are preferred due to symptom duration and severity. Prescribe doxycycline and antihistamine nasal spray. Advise avoiding family gatherings to prevent infection spread.  Conjunctivitis (likely secondary to sinusitis) Morning crusty discharge from the eyes, mainly the left, occasionally the right, likely secondary to sinusitis. No bacterial conjunctivitis signs. Treating sinusitis should resolve eye symptoms. Recommend over-the-counter allergy eye drops like Pataday. Reassure that symptoms should improve with sinusitis treatment.  Cold Sores (Herpes Labialis) Requests Valtrex prescription renewal, previously effective. Discussed Valtrex's role in reducing outbreak duration and severity when taken at symptom onset. Prescribe Valtrex, advising one or two tablets at the first sign of a cold sore, then two tablets 12 hours later.  Travel Anxiety Mentions expired Xanax for travel anxiety, no current refill needed. Plans to return in spring for a urine test and potential prescription renewal. Discussed anxiety management and possible medication need during travel. Advise returning in spring for a urine test and potential Xanax prescription renewal.  Follow-up Advise picking up prescriptions from the pharmacy downstairs. No immediate follow-up appointment scheduled.        No follow-ups on file.    Donato Schultz, DO

## 2023-12-17 ENCOUNTER — Other Ambulatory Visit: Payer: Self-pay

## 2023-12-17 ENCOUNTER — Other Ambulatory Visit (HOSPITAL_BASED_OUTPATIENT_CLINIC_OR_DEPARTMENT_OTHER): Payer: Self-pay

## 2023-12-18 ENCOUNTER — Encounter: Payer: Self-pay | Admitting: Family Medicine

## 2023-12-18 DIAGNOSIS — J0140 Acute pansinusitis, unspecified: Secondary | ICD-10-CM | POA: Insufficient documentation

## 2023-12-18 DIAGNOSIS — B001 Herpesviral vesicular dermatitis: Secondary | ICD-10-CM | POA: Insufficient documentation

## 2023-12-18 NOTE — Assessment & Plan Note (Signed)
Abx per orders Nasal steroid and antihistamine

## 2023-12-19 ENCOUNTER — Other Ambulatory Visit (HOSPITAL_BASED_OUTPATIENT_CLINIC_OR_DEPARTMENT_OTHER): Payer: Self-pay

## 2023-12-19 MED ORDER — PROGESTERONE MICRONIZED 100 MG PO CAPS
100.0000 mg | ORAL_CAPSULE | Freq: Every day | ORAL | 1 refills | Status: AC
  Filled 2023-12-19: qty 90, 90d supply, fill #0

## 2023-12-25 ENCOUNTER — Other Ambulatory Visit (HOSPITAL_BASED_OUTPATIENT_CLINIC_OR_DEPARTMENT_OTHER): Payer: Self-pay

## 2023-12-25 ENCOUNTER — Telehealth: Payer: Self-pay

## 2023-12-25 ENCOUNTER — Ambulatory Visit: Payer: 59 | Admitting: Psychology

## 2023-12-25 DIAGNOSIS — F4321 Adjustment disorder with depressed mood: Secondary | ICD-10-CM

## 2023-12-25 NOTE — Telephone Encounter (Signed)
 Copied from CRM [id number]. Topic: General - Other >> Dec 25, 2023 12:50 PM Rodman Pickle T wrote: Reason for CRM: patient is calling for Adriana Lopez to give her a call back regarding her copay for her appt she was wanting to make a payment

## 2023-12-25 NOTE — Progress Notes (Unsigned)
 [facility] Counselor/Therapist Progress Note  Patient ID: Adriana Lopez, MRN: [id number],    Date: 12/25/2023  Time Spent: 5:00pm-5:55pm   55 minutes   Treatment Type: Individual Therapy  Reported Symptoms: stress  Mental Status Exam: Appearance:  Casual     Behavior: Appropriate  Motor: Normal  Speech/Language:  Normal Rate  Affect: Appropriate  Mood: normal  Thought process: normal  Thought content:   WNL  Sensory/Perceptual disturbances:   WNL  Orientation: oriented to person, place, time/date, and situation  Attention: Good  Concentration: Good  Memory: WNL  Fund of knowledge:  Good  Insight:   Good  Judgment:  Good  Impulse Control: Good   Risk Assessment: Danger to Self:  No Self-injurious Behavior: No Danger to Others: No Duty to Warn:no Physical Aggression / Violence:No  Access to Firearms a concern: No  Gang Involvement:No   Subjective: Pt present for face-to-face individual therapy via video.  Pt consents to telehealth video session and is aware of limitations and benefits of virtual sessions. Location of pt: home Location of therapist: home office.  Pt talked about her family.   [name] has been needy and demanding.  [name] has threatened to give up her baby when she is upset.  Addressed how this impacts pt. Pt feels pressure to help out and take care of the baby which can be exhausting.  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  Helped pt process her feelings and family dynamics.   Pt talked about [name].  He is still not working.  Pt feels the financial burden.  Mark's mother died last week.   Addressed pt's loss of his mother. Pt is still grieving the death of her best friend [name].  Helped pt process her grief.   Pt talked about work.  Work has been very busy and stressful.  Worked on Optician, dispensing.   Worked on self care strategies. Provided supportive therapy.  Interventions: Cognitive Behavioral Therapy and  Insight-Oriented  Diagnosis: F43.21  Plan of Care: Recommend ongoing therapy.  Pt participated in setting treatment goals.  Plan to continue to meet monthly.    Treatment Plan  (Treatment Plan Target Date:  04/21/2024) Client Abilities/Strengths  Pt is bright, engaging, and motivated for therapy.   Client Treatment Preferences  Individual therapy.  Client Statement of Needs  Improve coping skills.  Symptoms  Depressed or irritable mood.  Problems Addressed  Unipolar Depression Goals 1. Alleviate depressive symptoms and return to previous level of effective functioning. 2. Appropriately grieve the loss in order to normalize mood and to return to previously adaptive level of functioning. Objective Learn and implement behavioral strategies to overcome depression. Target Date: 2024-04-21 Frequency: Monthly  Progress: 55 Modality: individual  Related Interventions Engage the client in "behavioral activation," increasing his/her activity level and contact with sources of reward, while identifying processes that inhibit activation.  Use behavioral techniques such as instruction, rehearsal, role-playing, role reversal, as needed, to facilitate activity in the client's daily life; reinforce success. Assist the client in developing skills that increase the likelihood of deriving pleasure from behavioral activation (e.g., assertiveness skills, developing an exercise plan, less internal/more external focus, increased social involvement); reinforce success. Objective Identify important people in life, past and present, and describe the quality, good and poor, of those relationships. Target Date: 2024-04-21 Frequency: Monthly  Progress: 55 Modality: individual  Related Interventions Conduct Interpersonal Therapy beginning with the assessment of the client's "interpersonal inventory" of important past and present relationships; develop  a case formulation linking depression to grief, interpersonal  role disputes, role transitions, and/or interpersonal deficits). Objective Learn and implement problem-solving and decision-making skills. Target Date: 2024-04-21 Frequency: Monthly  Progress: 55 Modality: individual  Related Interventions Conduct Problem-Solving Therapy using techniques such as psychoeducation, modeling, and role-playing to teach client problem-solving skills (i.e., defining a problem specifically, generating possible solutions, evaluating the pros and cons of each solution, selecting and implementing a plan of action, evaluating the efficacy of the plan, accepting or revising the plan); role-play application of the problem-solving skill to a real life issue. Encourage in the client the development of a positive problem orientation in which problems and solving them are viewed as a natural part of life and not something to be feared, despaired, or avoided. 3. Develop healthy interpersonal relationships that lead to the alleviation and help prevent the relapse of depression. 4. Develop healthy thinking patterns and beliefs about self, others, and the world that lead to the alleviation and help prevent the relapse of depression. 5. Recognize, accept, and cope with feelings of depression. Diagnosis F43.21  Conditions For Discharge Achievement of treatment goals and objectives   Salomon Fick, LCSW

## 2024-01-18 ENCOUNTER — Other Ambulatory Visit: Payer: Self-pay

## 2024-01-18 ENCOUNTER — Ambulatory Visit
Admission: RE | Admit: 2024-01-18 | Discharge: 2024-01-18 | Disposition: A | Discharge status: Home / Self Care | Source: Ambulatory Visit | Attending: Family Medicine

## 2024-01-18 VITALS — BP 123/75 | HR 73 | Temp 98.0°F | Resp 18 | Ht 64.0 in | Wt 115.0 lb

## 2024-01-18 DIAGNOSIS — H1032 Unspecified acute conjunctivitis, left eye: Secondary | ICD-10-CM | POA: Diagnosis not present

## 2024-01-18 MED ORDER — MOXIFLOXACIN HCL 0.5 % OP SOLN: 1.0000 [drp] | Freq: Three times a day (TID) | OPHTHALMIC | 0 refills | Status: AC

## 2024-01-18 NOTE — Discharge Instructions (Signed)
 Follow-up with your eye doctor in 48 hours if no improvement

## 2024-01-18 NOTE — ED Triage Notes (Signed)
 Pt presents with complaints of possible pink eye. Symptoms began this morning. Left eye itching + has drainage. Pt's allergies have worsened over the last two days. Headache + congestion also reported. OTC Zyrtec taken + allergy eye drops applied with no improvement. Pt currently rates her overall discomfort a 6/10.

## 2024-01-18 NOTE — ED Provider Notes (Signed)
 [facility]    CSN: [id number] Arrival date & time: 01/18/24  1154      History   Chief Complaint Chief Complaint  Patient presents with   Conjunctivitis    Not sure if I have pink eye or allergies . - Entered by patient    HPI Adriana Lopez is a 63 y.o. female.   The history is provided by the patient.  Conjunctivitis  Left eye irritation onset this morning then developed redness and a lot of mucus drainage.  Has had some allergy symptoms including rhinorrhea and nasal congestion.  Wears glasses but not contact lenses.  Denies known injury, foreign body sensation, change in vision, eye pain.  Denies history of prior eye surgeries.  Allergy to oxacillin  Past Medical History:  Diagnosis Date   Allergy    feline   Anxiety     Patient Active Problem List   Diagnosis Date Noted   Acute non-recurrent pansinusitis 12/18/2023   Cold sore 12/18/2023   Cervical radiculopathy 08/17/2021   History of Clostridioides difficile colitis 06/19/2021   Anxiety 06/19/2021   Acute pain of left shoulder 01/29/2021   Preventative health care 12/16/2019   Generalized anxiety disorder 03/22/2014   Low back pain 09/07/2013   Facial eczema 06/11/2013   Basal cell carcinoma 08/18/2012   Acute frontal sinusitis 10/19/2010    Past Surgical History:  Procedure Laterality Date   REFRACTIVE SURGERY  2012   Dr Delaney Meigs   SKIN CANCER EXCISION     right ear lobe   TONSILLECTOMY AND ADENOIDECTOMY      OB History   No obstetric history on file.      Home Medications    Prior to Admission medications   Medication Sig Start Date End Date Taking? Authorizing Provider  moxifloxacin (VIGAMOX) 0.5 % ophthalmic solution Place 1 drop into the left eye 3 (three) times daily for 5 days. 01/18/24 01/23/24 Yes Meliton Rattan, PA  acyclovir (ZOVIRAX) 200 MG capsule TAKE 1 CAPSULE (200 MG TOTAL) BY MOUTH 3 (THREE) TIMES DAILY. 02/11/22   Donato Schultz, DO  ALPRAZolam  (XANAX) 0.25 MG tablet Take 1 tablet (0.25 mg total) by mouth 2 (two) times daily as needed for anxiety. 12/16/23   Donato Schultz, DO  azelastine (ASTELIN) 0.1 % nasal spray Place 1 spray into both nostrils 2 (two) times daily. Use in each nostril as directed 12/16/23   Zola Button, Grayling Congress, DO  clotrimazole-betamethasone (LOTRISONE) cream Apply 1 Application topically 2 (two) times daily. 07/24/23     diclofenac (CATAFLAM) 50 MG tablet Take 1 tablet (50 mg total) by mouth 3 (three) times daily. 04/26/22   Donato Schultz, DO  estradiol (VIVELLE-DOT) 0.05 MG/24HR patch Place 1 patch (0.05 mg total) onto the skin twice weekly 10/22/22     estradiol (VIVELLE-DOT) 0.05 MG/24HR patch Place 1 patch (0.05 mg total) onto the skin 2 (two) times a week. 10/09/23     progesterone (PROMETRIUM) 100 MG capsule Take 100 mg by mouth at bedtime. 06/22/20   [provider]  progesterone (PROMETRIUM) 100 MG capsule Take 1 capsule (100 mg total) by mouth at bedtime. 12/19/23     triazolam (HALCION) 0.25 MG tablet Take 1 tablet (0.25 mg total) by mouth once daily 1 hour prior to dental appt. bring bottle to appt 09/03/23     valACYclovir (VALTREX) 500 MG tablet Take 1 tablet (500 mg total) by mouth 2 (two) times daily until gone 12/16/23  Donato Schultz, DO    Family History Family History  Problem Relation Age of Onset   Alzheimer's disease Mother    Heart disease Father        pacer   Cancer Father        dermatologic   Cancer Maternal Aunt        uterine   Alzheimer's disease Maternal Aunt    Diabetes Neg Hx    Stroke Neg Hx    Hypertension Neg Hx    Colon cancer Neg Hx    Throat cancer Neg Hx    Rectal cancer Neg Hx    Pancreatic cancer Neg Hx     Social History Social History   Tobacco Use   Smoking status: Former    Types: Cigarettes   Smokeless tobacco: Never  Vaping Use   Vaping status: Never Used  Substance Use Topics   Alcohol use: Yes    Alcohol/week: 7.0  standard drinks of alcohol    Types: 7 Glasses of wine per week   Drug use: No     Allergies   Amoxicillin   Review of Systems Review of Systems   Physical Exam Triage Vital Signs ED Triage Vitals  Encounter Vitals Group     BP 01/18/24 1205 123/75     Systolic BP Percentile --      Diastolic BP Percentile --      Pulse Rate 01/18/24 1205 73     Resp 01/18/24 1205 18     Temp 01/18/24 1205 98 F (36.7 C)     Temp Source 01/18/24 1205 Oral     SpO2 01/18/24 1205 95 %     Weight 01/18/24 1203 115 lb (52.2 kg)     Height 01/18/24 1203 5\' 4"  (1.626 m)     Head Circumference --      Peak Flow --      Pain Score 01/18/24 1202 6     Pain Loc --      Pain Education --      Exclude from Growth Chart --    No data found.  Updated Vital Signs BP 123/75 (BP Location: Right Arm)   Pulse 73   Temp 98 F (36.7 C) (Oral)   Resp 18   Ht 5\' 4"  (1.626 m)   Wt 115 lb (52.2 kg)   LMP 06/10/2013   SpO2 95%   BMI 19.74 kg/m   Visual Acuity Right Eye Distance: 20/25 Left Eye Distance: 20/20 Bilateral Distance: 20/20 (pt is currently wearing glasses for exam. wears glasses daily.)  Right Eye Near:   Left Eye Near:    Bilateral Near:     Physical Exam Vitals reviewed.  Constitutional:      Appearance: She is not ill-appearing.  HENT:     Head: Normocephalic and atraumatic.     Mouth/Throat:     Mouth: Mucous membranes are moist.  Eyes:     General: Lids are normal. Vision grossly intact.        Left eye: Discharge present.    Extraocular Movements: Extraocular movements intact.     Conjunctiva/sclera:     Left eye: Left conjunctiva is injected. No chemosis, exudate or hemorrhage.    Pupils: Pupils are equal, round, and reactive to light.     Left eye: No corneal abrasion or fluorescein uptake.  Neurological:     Mental Status: She is alert.      UC Treatments / Results  Labs (all labs  ordered are listed, but only abnormal results are displayed) Labs Reviewed  - No data to display  EKG   Radiology No results found.  Procedures Procedures (including critical care time)  Medications Ordered in UC Medications - No data to display  Initial Impression / Assessment and Plan / UC Course  I have reviewed the triage vital signs and the nursing notes.  Pertinent labs & imaging results that were available during my care of the patient were reviewed by me and considered in my medical decision making (see chart for details).     63 year old female with abrupt onset of left eye irritation redness and mucus drainage.  No foreign body was noted on exam, negative fluorescein uptake no chemosis. Final Clinical Impressions(s) / UC Diagnoses   Final diagnoses:  Acute bacterial conjunctivitis of left eye     Discharge Instructions      Follow-up with your eye doctor in 48 hours if no improvement     ED Prescriptions     Medication Sig Dispense Auth. Provider   moxifloxacin (VIGAMOX) 0.5 % ophthalmic solution Place 1 drop into the left eye 3 (three) times daily for 5 days. 3 mL Meliton Rattan, PA      PDMP not reviewed this encounter.   Meliton Rattan, [place] 01/18/24 [phone]

## 2024-01-19 ENCOUNTER — Ambulatory Visit (INDEPENDENT_AMBULATORY_CARE_PROVIDER_SITE_OTHER): Admitting: Physician Assistant

## 2024-01-19 ENCOUNTER — Encounter: Payer: Self-pay | Admitting: Physician Assistant

## 2024-01-19 ENCOUNTER — Other Ambulatory Visit (HOSPITAL_BASED_OUTPATIENT_CLINIC_OR_DEPARTMENT_OTHER): Payer: Self-pay

## 2024-01-19 ENCOUNTER — Ambulatory Visit: Payer: Self-pay | Admitting: *Deleted

## 2024-01-19 VITALS — BP 121/69 | HR 86 | Temp 98.0°F | Ht 64.0 in | Wt 118.4 lb

## 2024-01-19 DIAGNOSIS — H1033 Unspecified acute conjunctivitis, bilateral: Secondary | ICD-10-CM

## 2024-01-19 DIAGNOSIS — J0110 Acute frontal sinusitis, unspecified: Secondary | ICD-10-CM | POA: Diagnosis not present

## 2024-01-19 MED ORDER — PREDNISONE 20 MG PO TABS
20.0000 mg | ORAL_TABLET | Freq: Every day | ORAL | 0 refills | Status: DC
Start: 2024-01-19 — End: 2024-07-06
  Filled 2024-01-19: qty 5, 5d supply, fill #0

## 2024-01-19 MED ORDER — ERYTHROMYCIN 5 MG/GM OP OINT
1.0000 | TOPICAL_OINTMENT | Freq: Four times a day (QID) | OPHTHALMIC | 0 refills | Status: AC
  Filled 2024-01-19: qty 3.5, 5d supply, fill #0

## 2024-01-19 NOTE — Progress Notes (Signed)
 Established patient visit   Patient: Adriana Lopez   DOB: [birth year]   63 y.o. Female  MRN: [id number] Visit Date: 01/19/2024  Today's healthcare provider: Alfredia Ferguson, PA-C   Cc. Eye redness  Subjective     Pt was seen at urgent care 01/18/24, yesterday, for L eye conjunctivitis and was prescribed moxifloxacin drops.   Today, pr reports worsening of symptoms, including nasal congestion, headaches, sinus pressure. Overall symptoms started x 3-4 days ago.     Medications: Outpatient Medications Prior to Visit  Medication Sig   ALPRAZolam (XANAX) 0.25 MG tablet Take 1 tablet (0.25 mg total) by mouth 2 (two) times daily as needed for anxiety.   azelastine (ASTELIN) 0.1 % nasal spray Place 1 spray into both nostrils 2 (two) times daily. Use in each nostril as directed   clotrimazole-betamethasone (LOTRISONE) cream Apply 1 Application topically 2 (two) times daily.   diclofenac (CATAFLAM) 50 MG tablet Take 1 tablet (50 mg total) by mouth 3 (three) times daily.   estradiol (VIVELLE-DOT) 0.05 MG/24HR patch Place 1 patch (0.05 mg total) onto the skin twice weekly   estradiol (VIVELLE-DOT) 0.05 MG/24HR patch Place 1 patch (0.05 mg total) onto the skin 2 (two) times a week.   moxifloxacin (VIGAMOX) 0.5 % ophthalmic solution Place 1 drop into the left eye 3 (three) times daily for 5 days.   progesterone (PROMETRIUM) 100 MG capsule Take 100 mg by mouth at bedtime.   progesterone (PROMETRIUM) 100 MG capsule Take 1 capsule (100 mg total) by mouth at bedtime.   triazolam (HALCION) 0.25 MG tablet Take 1 tablet (0.25 mg total) by mouth once daily 1 hour prior to dental appt. bring bottle to appt   valACYclovir (VALTREX) 500 MG tablet Take 1 tablet (500 mg total) by mouth 2 (two) times daily until gone   [DISCONTINUED] acyclovir (ZOVIRAX) 200 MG capsule TAKE 1 CAPSULE (200 MG TOTAL) BY MOUTH 3 (THREE) TIMES DAILY.   No facility-administered medications prior to visit.    Review of  Systems  Constitutional:  Negative for fatigue and fever.  HENT:  Positive for congestion, rhinorrhea, sinus pressure and sinus pain.   Eyes:  Positive for pain, discharge and redness.  Respiratory:  Negative for cough and shortness of breath.   Cardiovascular:  Negative for chest pain and leg swelling.  Gastrointestinal:  Negative for abdominal pain.  Neurological:  Negative for dizziness and headaches.       Objective    BP 121/69   Pulse 86   Temp 98 F (36.7 C)   Ht 5\' 4"  (1.626 m)   Wt 118 lb 6.4 oz (53.7 kg)   LMP 06/10/2013   SpO2 100%   BMI 20.32 kg/m    Physical Exam Vitals reviewed.  Constitutional:      Appearance: She is not ill-appearing.  HENT:     Head: Normocephalic.  Eyes:     Pupils: Pupils are equal, round, and reactive to light.     Comments: B/l eyes with erythematous conjunctiva, L>R with continuous tearing of L eye and periocular erythema and mild edema around L eye  Cardiovascular:     Rate and Rhythm: Normal rate.  Pulmonary:     Effort: Pulmonary effort is normal. No respiratory distress.  Neurological:     Mental Status: She is alert and oriented to person, place, and time.  Psychiatric:        Mood and Affect: Mood normal.  Behavior: Behavior normal.     No results found for any visits on 01/19/24.  Assessment & Plan    Acute bacterial conjunctivitis of both eyes -     Erythromycin; Place 1 cm ribbon into both eyes 4 (four) times daily for 5 days.  Dispense: 3.5 g; Refill: 0  Acute non-recurrent frontal sinusitis -     predniSONE; Take 1 tablet (20 mg total) by mouth daily with breakfast.  Dispense: 5 tablet; Refill: 0   Likely viral illness causing conjunctivitis and sinusitis . Given severity, will switch moxiflox drops to erythromycin ointment Rx prednisone 20 mg x5 days for sinusitis   Return if symptoms worsen or fail to improve.       Alfredia Ferguson, PA-C  [facility] at [facility] [phone] (phone) [phone] (fax)  [facility]

## 2024-01-19 NOTE — Telephone Encounter (Signed)
 Copied from CRM [id number]. Topic: Clinical - Red Word Triage >> Jan 19, 2024  8:43 AM [place] A wrote: [facility] that prompted transfer to Nurse Triage: Patient went to ER yesterday because she believed she had Pink Eye and she does have Pink Eye in both eyes but left eye is worse. Patient has congestion, headache and swollen face and eyes Reason for Disposition  [1] SEVERE eyelid swelling (i.e., shut or almost) AND [2] involves both eyes (Exception: Itchy eyes, which  are probably an allergic reaction.)  Answer Assessment - Initial Assessment Questions 1. ONSET: "When did the swelling start?" (e.g., minutes, hours, days)     Went to the urgent care yesterday I have pink eye in both eyes.   I'm worse.  It spread to the other eye yesterday.   The left side of my face and eye are swollen.   I've had 3 doses of the drops yesterday and one this morning but I seem to be getting worse.   Headache and swollen face and eyes. 2. LOCATION: "What part of the eyelids is swollen?"     Left eye is worse.   I also have congestion and headache.  I saw Dr. Laury Axon for a sinus congestion 2 weeks ago.    I'm so congested.   3. SEVERITY: "How swollen is it?"     My left face is swollen and painful 4. ITCHING: "Is there any itching?" If Yes, ask: "How much?"   (Scale 1-10; mild, moderate or severe)     Yes a little 5. PAIN: "Is the swelling painful to touch?" If Yes, ask: "How painful is it?"   (Scale 1-10; mild, moderate or severe)     The left eye has a constant discharge and is worse and is irritated. 6. FEVER: "Do you have a fever?" If Yes, ask: "What is it, how was it measured, and when did it start?"      No 7. CAUSE: "What do you think is causing the swelling?"     Pink eye 8. RECURRENT SYMPTOM: "Have you had eyelid swelling before?" If Yes, ask: "When was the last time?" "What happened that time?"     It's gotten worse 9. OTHER SYMPTOMS: "Do you have any other symptoms?" (e.g., blurred vision, eye discharge,  rash, runny nose)     See above 10. PREGNANCY: "Is there any chance you are pregnant?" "When was your last menstrual period?"       N/A due to age  Protocols used: Eye - Swelling-A-AH  Chief Complaint: Pink eye in both eyes, left much worse this morning Symptoms: sinus congestion, headache.   Seen urgent care yesterday given eye drops but worse this morning Frequency: Has sinus infection a couple of weeks ago Pertinent Negatives: Patient denies fever Disposition: [] ED /[] Urgent Care (no appt availability in office) / [x] Appointment(In office/virtual)/ []  [facility]/ [] Home Care/ [] Refused Recommended Disposition /[] [facility]/ []  Follow-up with PCP Additional Notes: Appt made with Alfredia Ferguson, A-C for this morning

## 2024-01-21 ENCOUNTER — Ambulatory Visit: Payer: Self-pay | Admitting: Family Medicine

## 2024-01-21 ENCOUNTER — Encounter: Payer: Self-pay | Admitting: Physician Assistant

## 2024-01-21 NOTE — Telephone Encounter (Signed)
 Copied from CRM [id number]. Topic: Clinical - Medical Advice >> Jan 21, 2024  2:50 PM Fonda Kinder J wrote: Reason for CRM: PT called in regarding a message she sent on my chart: " I'm still pretty congested . Left eye is still very red face is still a little puffy . Right eye is better. I have 2 days left on the prednisone. I woke up today and gave a heroes under my nose. Probably irritated from all the nose blowing . Am I ok to take my acyclovir ?" Please advise

## 2024-01-21 NOTE — Telephone Encounter (Signed)
 Pt returned call advising she had already received a MyChart message from provider answering her question. Pt would like clarification on whether she will be safe to have her 32 month old grandson over this weekend as she is concerned about passing this along to him. Routing to provider for review. Pt okay with phone or MyChart.   Reason for Disposition  [1] Caller requesting NON-URGENT health information AND [2] PCP's office is the best resource  Answer Assessment - Initial Assessment Questions 1. REASON FOR CALL or QUESTION: "What is your reason for calling today?" or "How can I best help you?" or "What question do you have that I can help answer?"     Follow up for pink eye  Protocols used: Information Only Call - No Triage-A-AH

## 2024-01-22 ENCOUNTER — Ambulatory Visit: Payer: 59 | Admitting: Psychology

## 2024-01-22 DIAGNOSIS — F4321 Adjustment disorder with depressed mood: Secondary | ICD-10-CM

## 2024-01-22 NOTE — Progress Notes (Signed)
 [facility] Counselor/Therapist Progress Note  Patient ID: Adriana Lopez, MRN: [id number],    Date: 01/22/2024  Time Spent: 4:00pm-4:55pm   55 minutes   Treatment Type: Individual Therapy  Reported Symptoms: stress  Mental Status Exam: Appearance:  Casual     Behavior: Appropriate  Motor: Normal  Speech/Language:  Normal Rate  Affect: Appropriate  Mood: normal  Thought process: normal  Thought content:   WNL  Sensory/Perceptual disturbances:   WNL  Orientation: oriented to person, place, time/date, and situation  Attention: Good  Concentration: Good  Memory: WNL  Fund of knowledge:  Good  Insight:   Good  Judgment:  Good  Impulse Control: Good   Risk Assessment: Danger to Self:  No Self-injurious Behavior: No Danger to Others: No Duty to Warn:no Physical Aggression / Violence:No  Access to Firearms a concern: No  Gang Involvement:No   Subjective: Pt present for face-to-face individual therapy via video.  Pt consents to telehealth video session and is aware of limitations and benefits of virtual sessions. Location of pt: home Location of therapist: home office.  Pt talked about her family.   [name] has been needy and demanding.  Pt helps [name] take care of baby Polly Cobia.   Pt loves the baby and wants to be involved but it is also tiring to take care of him.   Pt's boyfriend [name] gets upset with the amount of time pt spends with her family and takes care of the baby.  Helped pt process her feelings and family dynamics.   Pt talked about [name].  He is still not working.  Pt feels the financial burden.  Pt states she does not want to be the bread winner.   She does not know how hard [name] is looking for a job.   He is being needy with pt which is frustrating for her.   Pt is withdrawing from [place] at times.   Pt is still grieving the death of her best friend [name].  Helped pt process her grief.   Pt talked about work.  Work has been very busy and stressful.  Worked  on [employer], dispensing.   Worked on self care strategies. Provided supportive therapy.  Interventions: Cognitive Behavioral Therapy and Insight-Oriented  Diagnosis: F43.21  Plan of Care: Recommend ongoing therapy.  Pt participated in setting treatment goals.  Plan to continue to meet monthly.    Treatment Plan  (Treatment Plan Target Date:  04/21/2024) Client Abilities/Strengths  Pt is bright, engaging, and motivated for therapy.   Client Treatment Preferences  Individual therapy.  Client Statement of Needs  Improve coping skills.  Symptoms  Depressed or irritable mood.  Problems Addressed  Unipolar Depression Goals 1. Alleviate depressive symptoms and return to previous level of effective functioning. 2. Appropriately grieve the loss in order to normalize mood and to return to previously adaptive level of functioning. Objective Learn and implement behavioral strategies to overcome depression. Target Date: 2024-04-21 Frequency: Monthly  Progress: 55 Modality: individual  Related Interventions Engage the client in "behavioral activation," increasing his/her activity level and contact with sources of reward, while identifying processes that inhibit activation.  Use behavioral techniques such as instruction, rehearsal, role-playing, role reversal, as needed, to facilitate activity in the client's daily life; reinforce success. Assist the client in developing skills that increase the likelihood of deriving pleasure from behavioral activation (e.g., assertiveness skills, developing an exercise plan, less internal/more external focus, increased social involvement); reinforce success. Objective Identify important people in life, past  and present, and describe the quality, good and poor, of those relationships. Target Date: 2024-04-21 Frequency: Monthly  Progress: 55 Modality: individual  Related Interventions Conduct Interpersonal Therapy beginning with the assessment of the client's  "interpersonal inventory" of important past and present relationships; develop a case formulation linking depression to grief, interpersonal role disputes, role transitions, and/or interpersonal deficits). Objective Learn and implement problem-solving and decision-making skills. Target Date: 2024-04-21 Frequency: Monthly  Progress: 55 Modality: individual  Related Interventions Conduct Problem-Solving Therapy using techniques such as psychoeducation, modeling, and role-playing to teach client problem-solving skills (i.e., defining a problem specifically, generating possible solutions, evaluating the pros and cons of each solution, selecting and implementing a plan of action, evaluating the efficacy of the plan, accepting or revising the plan); role-play application of the problem-solving skill to a real life issue. Encourage in the client the development of a positive problem orientation in which problems and solving them are viewed as a natural part of life and not something to be feared, despaired, or avoided. 3. Develop healthy interpersonal relationships that lead to the alleviation and help prevent the relapse of depression. 4. Develop healthy thinking patterns and beliefs about self, others, and the world that lead to the alleviation and help prevent the relapse of depression. 5. Recognize, accept, and cope with feelings of depression. Diagnosis F43.21  Conditions For Discharge Achievement of treatment goals and objectives   Salomon Fick, LCSW

## 2024-01-23 NOTE — Telephone Encounter (Signed)
 Called and spoke with patient. Instructions provided to patient per provider's note.

## 2024-01-28 ENCOUNTER — Other Ambulatory Visit (HOSPITAL_BASED_OUTPATIENT_CLINIC_OR_DEPARTMENT_OTHER): Payer: Self-pay

## 2024-01-28 ENCOUNTER — Other Ambulatory Visit: Payer: Self-pay | Admitting: Family Medicine

## 2024-01-28 DIAGNOSIS — B001 Herpesviral vesicular dermatitis: Secondary | ICD-10-CM

## 2024-01-28 MED ORDER — VALACYCLOVIR HCL 500 MG PO TABS
500.0000 mg | ORAL_TABLET | Freq: Two times a day (BID) | ORAL | 1 refills | Status: DC
  Filled 2024-01-28 – 2024-01-29 (×2): qty 20, 10d supply, fill #0
  Filled 2024-02-26: qty 20, 10d supply, fill #1

## 2024-01-29 ENCOUNTER — Other Ambulatory Visit (HOSPITAL_BASED_OUTPATIENT_CLINIC_OR_DEPARTMENT_OTHER): Payer: Self-pay

## 2024-02-04 ENCOUNTER — Other Ambulatory Visit (HOSPITAL_BASED_OUTPATIENT_CLINIC_OR_DEPARTMENT_OTHER): Payer: Self-pay

## 2024-02-04 LAB — HM MAMMOGRAPHY

## 2024-02-04 MED ORDER — PROGESTERONE MICRONIZED 100 MG PO CAPS
100.0000 mg | ORAL_CAPSULE | Freq: Every day | ORAL | 3 refills | Status: AC
  Filled 2024-02-04 – 2024-04-20 (×2): qty 90, 90d supply, fill #0
  Filled 2024-08-22: qty 90, 90d supply, fill #1

## 2024-02-04 MED ORDER — ESTRADIOL 0.05 MG/24HR TD PTTW
1.0000 | MEDICATED_PATCH | TRANSDERMAL | 3 refills | Status: AC
  Filled 2024-03-24: qty 8, 28d supply, fill #0
  Filled 2024-04-21: qty 8, 28d supply, fill #1
  Filled 2024-05-23: qty 8, 28d supply, fill #2
  Filled 2024-06-16: qty 8, 28d supply, fill #3

## 2024-02-16 ENCOUNTER — Ambulatory Visit (INDEPENDENT_AMBULATORY_CARE_PROVIDER_SITE_OTHER): Admitting: Psychology

## 2024-02-16 DIAGNOSIS — F4321 Adjustment disorder with depressed mood: Secondary | ICD-10-CM

## 2024-02-16 NOTE — Progress Notes (Signed)
 [facility] Counselor/Therapist Progress Note  Patient ID: Adriana Lopez, MRN: [id number],    Date: 02/16/2024  Time Spent: 11:00am-11:55am   55 minutes   Treatment Type: Individual Therapy  Reported Symptoms: stress  Mental Status Exam: Appearance:  Casual     Behavior: Appropriate  Motor: Normal  Speech/Language:  Normal Rate  Affect: Appropriate  Mood: normal  Thought process: normal  Thought content:   WNL  Sensory/Perceptual disturbances:   WNL  Orientation: oriented to person, place, time/date, and situation  Attention: Good  Concentration: Good  Memory: WNL  Fund of knowledge:  Good  Insight:   Good  Judgment:  Good  Impulse Control: Good   Risk Assessment: Danger to Self:  No Self-injurious Behavior: No Danger to Others: No Duty to Warn:no Physical Aggression / Violence:No  Access to Firearms a concern: No  Gang Involvement:No   Subjective: Pt present for face-to-face individual therapy via video.  Pt consents to telehealth video session and is aware of limitations and benefits of virtual sessions. Location of pt: home Location of therapist: home office.  Pt talked about her family.  Pt has spent some time with Adriana Lopez at his home and she wants to do more of that.   Adriana Lopez has been needy and demanding.  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   Pt loves the baby and wants to be involved but it is also tiring to take care of him.   Pt's boyfriend Adriana Lopez gets upset with the amount of time pt spends with her family and takes care of the baby.  Helped pt process her feelings and family dynamics.   Pt talked about Adriana Lopez.  He is still not working.  Pt feels the financial burden.  Pt states she does not want to be the bread winner.   She does not know how hard Adriana Lopez is looking for a job.   He is being needy with pt which is frustrating for her.   Pt is withdrawing from Adriana Lopez at times.   Pt had some conversations with Adriana Lopez about finances and expressed her feelings about not wanting to carry the financial burden.   Pt is still grieving the death of her best friend Adriana Lopez.  Helped pt process her grief.    Worked on self care strategies. Provided supportive therapy.  Interventions: Cognitive Behavioral Therapy and Insight-Oriented  Diagnosis: F43.21  Plan of Care: Recommend ongoing therapy.  Pt participated in setting treatment goals.  Plan to continue to meet monthly.    Treatment Plan  (Treatment Plan Target Date:  04/21/2024) Client Abilities/Strengths  Pt is bright, engaging, and motivated for therapy.   Client Treatment Preferences  Individual therapy.  Client Statement of Needs  Improve coping skills.  Symptoms  Depressed or irritable mood.  Problems Addressed  Unipolar Depression Goals 1. Alleviate depressive symptoms and return to previous level of effective functioning. 2. Appropriately grieve the loss in order to normalize mood and to return to previously adaptive level of functioning. Objective Learn and implement behavioral strategies to overcome depression. Target Date: 2024-04-21 Frequency: Monthly  Progress: 55 Modality: individual  Related Interventions Engage the client in "behavioral activation," increasing his/her activity level and contact with sources of reward, while identifying processes that inhibit activation.  Use behavioral techniques such as instruction, rehearsal, role-playing, role reversal, as needed, to facilitate activity in the client's daily life; reinforce success. Assist  the client in developing skills that increase the likelihood of deriving pleasure from behavioral activation (e.g., assertiveness skills, developing an exercise plan, less internal/more external focus, increased social involvement); reinforce success. Objective Identify important people in life, past and present, and describe the quality, good and poor, of those  relationships. Target Date: 2024-04-21 Frequency: Monthly  Progress: 55 Modality: individual  Related Interventions Conduct Interpersonal Therapy beginning with the assessment of the client's "interpersonal inventory" of important past and present relationships; develop a case formulation linking depression to grief, interpersonal role disputes, role transitions, and/or interpersonal deficits). Objective Learn and implement problem-solving and decision-making skills. Target Date: 2024-04-21 Frequency: Monthly  Progress: 55 Modality: individual  Related Interventions Conduct Problem-Solving Therapy using techniques such as psychoeducation, modeling, and role-playing to teach client problem-solving skills (i.e., defining a problem specifically, generating possible solutions, evaluating the pros and cons of each solution, selecting and implementing a plan of action, evaluating the efficacy of the plan, accepting or revising the plan); role-play application of the problem-solving skill to a real life issue. Encourage in the client the development of a positive problem orientation in which problems and solving them are viewed as a natural part of life and not something to be feared, despaired, or avoided. 3. Develop healthy interpersonal relationships that lead to the alleviation and help prevent the relapse of depression. 4. Develop healthy thinking patterns and beliefs about self, others, and the world that lead to the alleviation and help prevent the relapse of depression. 5. Recognize, accept, and cope with feelings of depression. Diagnosis F43.21  Conditions For Discharge Achievement of treatment goals and objectives   Willey Harrier, LCSW

## 2024-02-19 ENCOUNTER — Ambulatory Visit: Payer: 59 | Admitting: Psychology

## 2024-03-18 ENCOUNTER — Ambulatory Visit (INDEPENDENT_AMBULATORY_CARE_PROVIDER_SITE_OTHER): Admitting: Psychology

## 2024-03-18 DIAGNOSIS — F4321 Adjustment disorder with depressed mood: Secondary | ICD-10-CM

## 2024-03-18 NOTE — Progress Notes (Signed)
 [facility] Counselor/Therapist Progress Note  Patient ID: Adriana Lopez, MRN: [id number],    Date: 03/18/2024  Time Spent: 4:00pm-4:55pm   55 minutes   Treatment Type: Individual Therapy  Reported Symptoms: stress  Mental Status Exam: Appearance:  Casual     Behavior: Appropriate  Motor: Normal  Speech/Language:  Normal Rate  Affect: Appropriate  Mood: normal  Thought process: normal  Thought content:   WNL  Sensory/Perceptual disturbances:   WNL  Orientation: oriented to person, place, time/date, and situation  Attention: Good  Concentration: Good  Memory: WNL  Fund of knowledge:  Good  Insight:   Good  Judgment:  Good  Impulse Control: Good   Risk Assessment: Danger to Self:  No Self-injurious Behavior: No Danger to Others: No Duty to Warn:no Physical Aggression / Violence:No  Access to Firearms a concern: No  Gang Involvement:No   Subjective: Pt present for face-to-face individual therapy via video.  Pt consents to telehealth video session and is aware of limitations and benefits of virtual sessions. Location of pt: home Location of therapist: home office.  Pt states she is not feeling happy and doesn't want to talk to anyone.  She feels like she needs a break from all of her family bc they have been so needy.   Pt is upset with Adriana Lopez bc he does not have car insurance and needs a hair cut and is not taking responsibility for either one.    Pt talked about Adriana Lopez.  He is working part time at [employer] but is still not contributing financially to the household.  Pt feels the financial burden.   Pt is withdrawing from [place] at times.  Pt needs to have a conversation with Adriana Lopez about finances and how she feels.  Helped pt with how she can communicate her needs and feelings effectively.   Pt is still grieving the death of her best friend Adriana Lopez.  Helped pt process her grief.    Worked on self care strategies. Provided supportive therapy.  Interventions:  Cognitive Behavioral Therapy and Insight-Oriented  Diagnosis: F43.21  Plan of Care: Recommend ongoing therapy.  Pt participated in setting treatment goals.  Plan to continue to meet monthly.    Treatment Plan  (Treatment Plan Target Date:  04/21/2024) Client Abilities/Strengths  Pt is bright, engaging, and motivated for therapy.   Client Treatment Preferences  Individual therapy.  Client Statement of Needs  Improve coping skills.  Symptoms  Depressed or irritable mood.  Problems Addressed  Unipolar Depression Goals 1. Alleviate depressive symptoms and return to previous level of effective functioning. 2. Appropriately grieve the loss in order to normalize mood and to return to previously adaptive level of functioning. Objective Learn and implement behavioral strategies to overcome depression. Target Date: 2024-04-21 Frequency: Monthly  Progress: 55 Modality: individual  Related Interventions Engage the client in "behavioral activation," increasing his/her activity level and contact with sources of reward, while identifying processes that inhibit activation.  Use behavioral techniques such as instruction, rehearsal, role-playing, role reversal, as needed, to facilitate activity in the client's daily life; reinforce success. Assist the client in developing skills that increase the likelihood of deriving pleasure from behavioral activation (e.g., assertiveness skills, developing an exercise plan, less internal/more external focus, increased social involvement); reinforce success. Objective Identify important people in life, past and present, and describe the quality, good and poor, of those relationships. Target Date: 2024-04-21 Frequency: Monthly  Progress: 55 Modality: individual  Related Interventions Conduct Interpersonal Therapy beginning with the  assessment of the client's "interpersonal inventory" of important past and present relationships; develop a case formulation linking  depression to grief, interpersonal role disputes, role transitions, and/or interpersonal deficits). Objective Learn and implement problem-solving and decision-making skills. Target Date: 2024-04-21 Frequency: Monthly  Progress: 55 Modality: individual  Related Interventions Conduct Problem-Solving Therapy using techniques such as psychoeducation, modeling, and role-playing to teach client problem-solving skills (i.e., defining a problem specifically, generating possible solutions, evaluating the pros and cons of each solution, selecting and implementing a plan of action, evaluating the efficacy of the plan, accepting or revising the plan); role-play application of the problem-solving skill to a real life issue. Encourage in the client the development of a positive problem orientation in which problems and solving them are viewed as a natural part of life and not something to be feared, despaired, or avoided. 3. Develop healthy interpersonal relationships that lead to the alleviation and help prevent the relapse of depression. 4. Develop healthy thinking patterns and beliefs about self, others, and the world that lead to the alleviation and help prevent the relapse of depression. 5. Recognize, accept, and cope with feelings of depression. Diagnosis F43.21  Conditions For Discharge Achievement of treatment goals and objectives   Adriana Harrier, LCSW

## 2024-03-19 ENCOUNTER — Other Ambulatory Visit (HOSPITAL_BASED_OUTPATIENT_CLINIC_OR_DEPARTMENT_OTHER): Payer: Self-pay | Admitting: Obstetrics and Gynecology

## 2024-03-19 DIAGNOSIS — M8580 Other specified disorders of bone density and structure, unspecified site: Secondary | ICD-10-CM

## 2024-03-24 ENCOUNTER — Other Ambulatory Visit (HOSPITAL_BASED_OUTPATIENT_CLINIC_OR_DEPARTMENT_OTHER): Payer: Self-pay

## 2024-03-25 ENCOUNTER — Other Ambulatory Visit (HOSPITAL_BASED_OUTPATIENT_CLINIC_OR_DEPARTMENT_OTHER): Payer: Self-pay

## 2024-04-01 ENCOUNTER — Other Ambulatory Visit (HOSPITAL_BASED_OUTPATIENT_CLINIC_OR_DEPARTMENT_OTHER)

## 2024-04-15 ENCOUNTER — Ambulatory Visit (INDEPENDENT_AMBULATORY_CARE_PROVIDER_SITE_OTHER): Admitting: Psychology

## 2024-04-15 DIAGNOSIS — F4321 Adjustment disorder with depressed mood: Secondary | ICD-10-CM | POA: Diagnosis not present

## 2024-04-15 NOTE — Progress Notes (Signed)
 [facility] Counselor/Therapist Progress Note  Patient ID: Adriana Lopez, MRN: [id number],    Date: 04/15/2024  Time Spent: 10:00am-10:55am   55 minutes   Treatment Type: Individual Therapy  Reported Symptoms: stress  Mental Status Exam: Appearance:  Casual     Behavior: Appropriate  Motor: Normal  Speech/Language:  Normal Rate  Affect: Appropriate  Mood: normal  Thought process: normal  Thought content:   WNL  Sensory/Perceptual disturbances:   WNL  Orientation: oriented to person, place, time/date, and situation  Attention: Good  Concentration: Good  Memory: WNL  Fund of knowledge:  Good  Insight:   Good  Judgment:  Good  Impulse Control: Good   Risk Assessment: Danger to Self:  No Self-injurious Behavior: No Danger to Others: No Duty to Warn:no Physical Aggression / Violence:No  Access to Firearms a concern: No  Gang Involvement:No   Subjective: Pt present for face-to-face individual therapy via video.  Pt consents to telehealth video session and is aware of limitations and benefits of virtual sessions. Location of pt: home Location of therapist: home office.  Pt talked about Adriana Lopez.  Pt had a conversation with Adriana Lopez about finances and how she feels.  Addressed their interaction.   Adriana Lopez got defensive and pt did not feel like the conversation went well.   Pt did do a good job expressing her thoughts and feelings to Adriana Lopez. Helped pt process the relationship dynamics. Pt talked about issues with her kids.   She is trying to spend time with them and Adriana Lopez gets upset about that at times.   Pt is not giving in to Adriana Lopez and is doing what she feels is best for her and her kids.   Pt is still grieving the death of her best friend Adriana Lopez.  Helped pt process her grief.    Worked on self care strategies. Provided supportive therapy.  Interventions: Cognitive Behavioral Therapy and Insight-Oriented  Diagnosis: F43.21  Plan of Care: Recommend ongoing therapy.  Pt  participated in setting treatment goals.  Plan to continue to meet monthly.    Treatment Plan  (Treatment Plan Target Date:  04/21/2024) Client Abilities/Strengths  Pt is bright, engaging, and motivated for therapy.   Client Treatment Preferences  Individual therapy.  Client Statement of Needs  Improve coping skills.  Symptoms  Depressed or irritable mood.  Problems Addressed  Unipolar Depression Goals 1. Alleviate depressive symptoms and return to previous level of effective functioning. 2. Appropriately grieve the loss in order to normalize mood and to return to previously adaptive level of functioning. Objective Learn and implement behavioral strategies to overcome depression. Target Date: 2024-04-21 Frequency: Monthly  Progress: 55 Modality: individual  Related Interventions Engage the client in behavioral activation, increasing his/her activity level and contact with sources of reward, while identifying processes that inhibit activation.  Use behavioral techniques such as instruction, rehearsal, role-playing, role reversal, as needed, to facilitate activity in the client's daily life; reinforce success. Assist the client in developing skills that increase the likelihood of deriving pleasure from behavioral activation (e.g., assertiveness skills, developing an exercise plan, less internal/more external focus, increased social involvement); reinforce success. Objective Identify important people in life, past and present, and describe the quality, good and poor, of those relationships. Target Date: 2024-04-21 Frequency: Monthly  Progress: 55 Modality: individual  Related Interventions Conduct Interpersonal Therapy beginning with the assessment of the client's interpersonal inventory of important past and present relationships; develop a case formulation linking depression to grief, interpersonal role disputes, role  transitions, and/or interpersonal deficits). Objective Learn and  implement problem-solving and decision-making skills. Target Date: 2024-04-21 Frequency: Monthly  Progress: 55 Modality: individual  Related Interventions Conduct Problem-Solving Therapy using techniques such as psychoeducation, modeling, and role-playing to teach client problem-solving skills (i.e., defining a problem specifically, generating possible solutions, evaluating the pros and cons of each solution, selecting and implementing a plan of action, evaluating the efficacy of the plan, accepting or revising the plan); role-play application of the problem-solving skill to a real life issue. Encourage in the client the development of a positive problem orientation in which problems and solving them are viewed as a natural part of life and not something to be feared, despaired, or avoided. 3. Develop healthy interpersonal relationships that lead to the alleviation and help prevent the relapse of depression. 4. Develop healthy thinking patterns and beliefs about self, others, and the world that lead to the alleviation and help prevent the relapse of depression. 5. Recognize, accept, and cope with feelings of depression. Diagnosis F43.21  Conditions For Discharge Achievement of treatment goals and objectives   Willey Harrier, LCSW

## 2024-04-20 ENCOUNTER — Other Ambulatory Visit (HOSPITAL_BASED_OUTPATIENT_CLINIC_OR_DEPARTMENT_OTHER): Payer: Self-pay

## 2024-05-03 ENCOUNTER — Ambulatory Visit (HOSPITAL_BASED_OUTPATIENT_CLINIC_OR_DEPARTMENT_OTHER)
Admission: RE | Admit: 2024-05-03 | Discharge: 2024-05-03 | Disposition: A | Discharge status: Home / Self Care | Source: Ambulatory Visit | Attending: Obstetrics and Gynecology | Admitting: Obstetrics and Gynecology

## 2024-05-03 DIAGNOSIS — M8580 Other specified disorders of bone density and structure, unspecified site: Secondary | ICD-10-CM | POA: Diagnosis present

## 2024-05-20 ENCOUNTER — Ambulatory Visit: Admitting: Psychology

## 2024-05-28 ENCOUNTER — Ambulatory Visit: Admitting: Psychology

## 2024-05-28 DIAGNOSIS — F4321 Adjustment disorder with depressed mood: Secondary | ICD-10-CM

## 2024-05-28 NOTE — Progress Notes (Signed)
 [facility] Counselor Initial Adult Exam  Name: Adriana Lopez Date: 05/28/2024 MRN: [id number] DOB: Oct 10, 1961 PCP: Antonio Cyndee Jamee JONELLE, DO  Time spent: 11:00am-11:55am      55 minutes  Guardian/Payee:  debara Six requested: No   Reason for Visit /Presenting Problem: Pt present for face-to-face initial assessment update via video.  Pt consents to telehealth video session and is aware of limitations of virtual sessions. Location of pt: home Location of therapist: home office.  Pt talked about a lot going on in her family.   Pt's son and daughter are not getting along which triggers pt.  There have also been some issues with pt's partner [name].   Pt has worried about his employment history and finances but has had good conversations with him about these issues.  [name] recently proposed to pt and she said yes.  Reviewed pt's treatment plan for annual update.    Updated treatment plan and IA.  Pt participated in setting treatment goals.  Pt is in agreement with treatment plan.  Plan to continue to meet monthly.    Mental Status Exam: Appearance:   Casual     Behavior:  Appropriate  Motor:  Normal  Speech/Language:   Normal Rate  Affect:  Appropriate  Mood:  normal  Thought process:  normal  Thought content:    WNL  Sensory/Perceptual disturbances:    WNL  Orientation:  oriented to person, place, time/date, and situation  Attention:  Good  Concentration:  Good  Memory:  WNL  Fund of knowledge:   Good  Insight:    Good  Judgment:   Good  Impulse Control:  Good     Reported Symptoms:  stress  Risk Assessment: Danger to Self:  No Self-injurious Behavior: No Danger to Others: No Duty to Warn:no Physical Aggression / Violence:No  Access to Firearms a concern: No  Gang Involvement:No  Patient / guardian was educated about steps to take if suicide or homicide risk level increases between visits: n/a While future psychiatric events cannot be accurately  predicted, the patient does not currently require acute inpatient psychiatric care and does not currently meet [facility]  involuntary commitment criteria.  Substance Abuse History: Current substance abuse: No     Past Psychiatric History:   Previous psychological history is significant for anxiety and depression Outpatient Providers:Pt has been in therapy in the past.  She had anxiety and depression when in previous marriage and going through her divorce.    History of Psych Hospitalization: No  Psychological Testing: n/a   Abuse History:  Victim of: No., n/a   Report needed: No. Victim of Neglect:No. Perpetrator of n/a  Witness / Exposure to Domestic Violence: No   Protective Services Involvement: No  Witness to [employer] Violence:  No   Family History:  Family History  Problem Relation Age of Onset   Alzheimer's disease Mother    Heart disease Father        pacer   Cancer Father        dermatologic   Cancer Maternal Aunt        uterine   Alzheimer's disease Maternal Aunt    Diabetes Neg Hx    Stroke Neg Hx    Hypertension Neg Hx    Colon cancer Neg Hx    Throat cancer Neg Hx    Rectal cancer Neg Hx    Pancreatic cancer Neg Hx     Living situation: the patient lives with her  partner Merchant navy officer.  Pt is youngest of five kids.  Father was a functional alcoholic.  No abuse . Parents were married but not a happy marriage.   Pt's older sister passed away.   Brother is recovering drug addict.  He has been a support for pt recently.  Pt has rekindled relationships with brother and sister and niece and nephew.   Parents have been deceased for several years.    Sexual Orientation: Straight  Relationship Status: divorced  Name of spouse / other:pt is not married but lives with her partner [name]. If a parent, number of children / ages:pt has a young adult son and a young adult daughter   Support Systems: significant other friends  Financial Stress:  No    Income/Employment/Disability: Employment Pt is a Runner, broadcasting/film/video.   Military Service: No   Educational History: Education: Risk manager: Catholic  Any cultural differences that may affect / interfere with treatment:  not applicable   Recreation/Hobbies: pt likes to take walks and exercise.   Stressors: Marital or family conflict   Occupational concerns    Strengths: Supportive Relationships, Hopefulness, Self Advocate, and Able to Communicate Effectively  Barriers:  none   Legal History: Pending legal issue / charges: The patient has no significant history of legal issues. History of legal issue / charges: n/a  Medical History/Surgical History: reviewed Past Medical History:  Diagnosis Date   Allergy     feline   Anxiety     Past Surgical History:  Procedure Laterality Date   REFRACTIVE SURGERY  2012   Dr Meridee   SKIN CANCER EXCISION     right ear lobe   TONSILLECTOMY AND ADENOIDECTOMY      Medications: Current Outpatient Medications  Medication Sig Dispense Refill   ALPRAZolam  (XANAX ) 0.25 MG tablet Take 1 tablet (0.25 mg total) by mouth 2 (two) times daily as needed for anxiety. 20 tablet 0   azelastine  (ASTELIN ) 0.1 % nasal spray Place 1 spray into both nostrils 2 (two) times daily. Use in each nostril as directed 30 mL 12   clotrimazole -betamethasone  (LOTRISONE ) cream Apply 1 Application topically 2 (two) times daily. 45 g 5   diclofenac  (CATAFLAM ) 50 MG tablet Take 1 tablet (50 mg total) by mouth 3 (three) times daily. 90 tablet 3   erythromycin  ophthalmic ointment Place 1 cm ribbon into both eyes 4 (four) times daily for 5 days. 3.5 g 0   estradiol  (VIVELLE -DOT) 0.05 MG/24HR patch Place 1 patch (0.05 mg total) onto the skin twice weekly 8 patch 5   estradiol  (VIVELLE -DOT) 0.05 MG/24HR patch Place 1 patch (0.05 mg total) onto the skin 2 (two) times a week. 24 patch 2   estradiol  (VIVELLE -DOT) 0.05 MG/24HR patch Place 1  patch (0.05 mg total) onto the skin 2 (two) times a week. 8 patch 3   predniSONE  (DELTASONE ) 20 MG tablet Take 1 tablet (20 mg total) by mouth daily with breakfast. 5 tablet 0   progesterone  (PROMETRIUM ) 100 MG capsule Take 100 mg by mouth at bedtime.     progesterone  (PROMETRIUM ) 100 MG capsule Take 1 capsule (100 mg total) by mouth at bedtime. 90 capsule 1   progesterone  (PROMETRIUM ) 100 MG capsule Take 1 capsule (100 mg total) by mouth at bedtime. 90 capsule 3   triazolam  (HALCION ) 0.25 MG tablet Take 1 tablet (0.25 mg total) by mouth once daily 1 hour prior to dental appt. bring bottle to appt 3 tablet 0   valACYclovir  (VALTREX ) 500 MG tablet  Take 1 tablet (500 mg total) by mouth 2 (two) times daily until gone 20 tablet 1   No current facility-administered medications for this visit.    Allergies  Allergen Reactions   Amoxicillin  Diarrhea    Also had C Diff    Diagnoses:  F43.21  Plan of Care: Recommend ongoing therapy.  Pt participated in setting treatment goals.  Plan to continue to meet monthly.  Pt agrees with treatment plan.  Treatment Plan  (Treatment Plan Target Date:  05/28/2025) Client Abilities/Strengths  Pt is bright, engaging, and motivated for therapy.   Client Treatment Preferences  Individual therapy.  Client Statement of Needs  Improve coping skills.  Symptoms  Depressed or irritable mood.  Problems Addressed  Unipolar Depression Goals 1. Alleviate depressive symptoms and return to previous level of effective functioning. 2. Appropriately grieve the loss in order to normalize mood and to return to previously adaptive level of functioning. Objective Learn and implement behavioral strategies to overcome depression. Target Date: 2025-05-28 Frequency: Monthly  Progress: 60 Modality: individual  Related Interventions Engage the client in behavioral activation, increasing his/her activity level and contact with sources of reward, while identifying processes  that inhibit activation.  Use behavioral techniques such as instruction, rehearsal, role-playing, role reversal, as needed, to facilitate activity in the client's daily life; reinforce success. Assist the client in developing skills that increase the likelihood of deriving pleasure from behavioral activation (e.g., assertiveness skills, developing an exercise plan, less internal/more external focus, increased social involvement); reinforce success. Objective Identify important people in life, past and present, and describe the quality, good and poor, of those relationships. Target Date: 2025-05-28 Frequency: Monthly  Progress: 60 Modality: individual  Related Interventions Conduct Interpersonal Therapy beginning with the assessment of the client's interpersonal inventory of important past and present relationships; develop a case formulation linking depression to grief, interpersonal role disputes, role transitions, and/or interpersonal deficits). Objective Learn and implement problem-solving and decision-making skills. Target Date: 2025-05-28 Frequency: Monthly  Progress: 60 Modality: individual  Related Interventions Conduct Problem-Solving Therapy using techniques such as psychoeducation, modeling, and role-playing to teach client problem-solving skills (i.e., defining a problem specifically, generating possible solutions, evaluating the pros and cons of each solution, selecting and implementing a plan of action, evaluating the efficacy of the plan, accepting or revising the plan); role-play application of the problem-solving skill to a real life issue. Encourage in the client the development of a positive problem orientation in which problems and solving them are viewed as a natural part of life and not something to be feared, despaired, or avoided. 3. Develop healthy interpersonal relationships that lead to the alleviation and help prevent the relapse of depression. 4. Develop healthy thinking  patterns and beliefs about self, others, and the world that lead to the alleviation and help prevent the relapse of depression. 5. Recognize, accept, and cope with feelings of depression. Diagnosis F43.21  Conditions For Discharge Achievement of treatment goals and objectives  [name], LCSW

## 2024-06-07 ENCOUNTER — Other Ambulatory Visit (HOSPITAL_BASED_OUTPATIENT_CLINIC_OR_DEPARTMENT_OTHER): Payer: Self-pay

## 2024-06-07 ENCOUNTER — Other Ambulatory Visit: Payer: Self-pay | Admitting: Family Medicine

## 2024-06-07 DIAGNOSIS — B001 Herpesviral vesicular dermatitis: Secondary | ICD-10-CM

## 2024-06-07 MED ORDER — VALACYCLOVIR HCL 500 MG PO TABS
500.0000 mg | ORAL_TABLET | Freq: Two times a day (BID) | ORAL | 1 refills | Status: DC
  Filled 2024-06-07: qty 20, 10d supply, fill #0
  Filled 2024-08-22: qty 20, 10d supply, fill #1

## 2024-06-22 ENCOUNTER — Ambulatory Visit (INDEPENDENT_AMBULATORY_CARE_PROVIDER_SITE_OTHER): Admitting: Psychology

## 2024-06-22 DIAGNOSIS — F4321 Adjustment disorder with depressed mood: Secondary | ICD-10-CM | POA: Diagnosis not present

## 2024-06-22 NOTE — Progress Notes (Signed)
 Adriana Lopez  Patient ID: Adriana Lopez, MRN: [id number],    Date: 06/22/2024  Time Spent: 4:00pm-4:55pm  55 minutes   Treatment Type: Individual Therapy  Reported Symptoms: stress  Mental Status Exam: Appearance:  Casual     Behavior: Appropriate  Motor: Normal  Speech/Language:  Normal Rate  Affect: Appropriate  Mood: normal  Thought process: normal  Thought content:   WNL  Sensory/Perceptual disturbances:   WNL  Orientation: oriented to person, place, time/date, and situation  Attention: Good  Concentration: Good  Memory: WNL  Fund of knowledge:  Good  Insight:   Good  Judgment:  Good  Impulse Control: Good   Risk Assessment: Danger to Self:  No Self-injurious Behavior: No Danger to Others: No Duty to Warn:no Physical Aggression / Violence:No  Access to Firearms a concern: No  Gang Involvement:No   Subjective: Pt present for face-to-face individual therapy via video.  Pt consents to telehealth video session and is aware of limitations and benefits of virtual sessions.  Location of pt: home Location of therapist: home office.   Pt talked about the loss of her best friend Darice.   Today would have been her birthday.   This has triggered grief for pt.   Pt misses Darice and the history they had together since they have been friends since age 32.  Helped pt process her feelings and grief.   Pt talked about her daughter who can be very challenging to interact with at times.  Addressed their dynamics and how they impact pt.   Pt does not like the choices her daughter makes.   She then expects pt to help her out.  Addressed how pt can set healthy boundaries.  Pt talked about her relationship with [name].  He is still not working which bothers pt but they are planning trips and pt is happy about being engaged.  Worked on self care strategies. Provided supportive therapy.   Interventions: Cognitive Behavioral Therapy and  Insight-Oriented  Diagnosis:  F43.21  Plan of Care: Recommend ongoing therapy.  Pt participated in setting treatment goals.  Plan to continue to meet monthly.  Pt agrees with treatment plan.  Treatment Plan  (Treatment Plan Target Date:  05/28/2025) Client Abilities/Strengths  Pt is bright, engaging, and motivated for therapy.   Client Treatment Preferences  Individual therapy.  Client Statement of Needs  Improve coping skills.  Symptoms  Depressed or irritable mood.  Problems Addressed  Unipolar Depression Goals 1. Alleviate depressive symptoms and return to previous level of effective functioning. 2. Appropriately grieve the loss in order to normalize mood and to return to previously adaptive level of functioning. Objective Learn and implement behavioral strategies to overcome depression. Target Date: 2025-05-28 Frequency: Monthly  Progress: 60 Modality: individual  Related Interventions Engage the client in behavioral activation, increasing his/her activity level and contact with sources of reward, while identifying processes that inhibit activation.  Use behavioral techniques such as instruction, rehearsal, role-playing, role reversal, as needed, to facilitate activity in the client's daily life; reinforce success. Assist the client in developing skills that increase the likelihood of deriving pleasure from behavioral activation (e.g., assertiveness skills, developing an exercise plan, less internal/more external focus, increased social involvement); reinforce success. Objective Identify important people in life, past and present, and describe the quality, good and poor, of those relationships. Target Date: 2025-05-28 Frequency: Monthly  Progress: 60 Modality: individual  Related Interventions Conduct Interpersonal Therapy beginning with the assessment of the client's interpersonal  inventory of important past and present relationships; develop a case formulation linking  depression to grief, interpersonal role disputes, role transitions, and/or interpersonal deficits). Objective Learn and implement problem-solving and decision-making skills. Target Date: 2025-05-28 Frequency: Monthly  Progress: 60 Modality: individual  Related Interventions Conduct Problem-Solving Therapy using techniques such as psychoeducation, modeling, and role-playing to teach client problem-solving skills (i.e., defining a problem specifically, generating possible solutions, evaluating the pros and cons of each solution, selecting and implementing a plan of action, evaluating the efficacy of the plan, accepting or revising the plan); role-play application of the problem-solving skill to a real life issue. Encourage in the client the development of a positive problem orientation in which problems and solving them are viewed as a natural part of life and not something to be feared, despaired, or avoided. 3. Develop healthy interpersonal relationships that lead to the alleviation and help prevent the relapse of depression. 4. Develop healthy thinking patterns and beliefs about self, others, and the world that lead to the alleviation and help prevent the relapse of depression. 5. Recognize, accept, and cope with feelings of depression. Diagnosis F43.21  Conditions For Discharge Achievement of treatment goals and objectives   [name], LCSW

## 2024-07-01 ENCOUNTER — Ambulatory Visit: Payer: Self-pay

## 2024-07-01 ENCOUNTER — Encounter: Payer: Self-pay | Admitting: Family Medicine

## 2024-07-01 NOTE — Telephone Encounter (Signed)
 FYI Only or Action Required?: FYI only for provider.  Patient was last seen in primary care on 12/16/2023 by Adriana Lopez, Adriana SAUNDERS, DO.  Called Nurse Triage reporting Shoulder Pain.  Symptoms began several weeks ago.  Interventions attempted: OTC medications: Tylenol, advil, voltaren gel.  Symptoms are: gradually worsening.  Triage Disposition: See PCP When Office is Open (Within 3 Days)  Patient/caregiver understands and will follow disposition?: Yes, but will wait- Prefers to see PCP only and can only schedule for after 4pm  Copied from CRM #[id number]. Topic: Clinical - Red Word Triage >> Jul 01, 2024  2:08 PM Rea ORN wrote: Red Word that prompted transfer to Nurse Triage: Pt has worsening pain in right shoulder for the past few weeks. Pt looking for an afternoon appt. Reason for Disposition  [1] MODERATE pain (e.g., interferes with normal activities) AND [2] present > 3 days  Answer Assessment - Initial Assessment Questions 1. ONSET: When did the pain start?     Has been tweaking over the summer, but has been pretty persistent the past 2 weeks.  2. LOCATION: Where is the pain located?     Between  right shoulder and elbow, bicep area  3. PAIN: How bad is the pain? (Scale 1-10; or mild, moderate, severe)     6/10 pain  4. WORK OR EXERCISE: Has there been any recent work or exercise that involved this part of the body?     Reports doing a lot of swimming over the summer but didn't have any pain at that   5. CAUSE: What do you think is causing the shoulder pain?     Unsure of cause  6. OTHER SYMPTOMS: Do you have any other symptoms? (e.g., neck pain, swelling, rash, fever, numbness, weakness)     Right side neck pain  7. PREGNANCY: Is there any chance you are pregnant? When was your last menstrual period?     No  Protocols used: Shoulder Pain-A-AH

## 2024-07-01 NOTE — Telephone Encounter (Signed)
 Appt scheduled

## 2024-07-06 ENCOUNTER — Ambulatory Visit (INDEPENDENT_AMBULATORY_CARE_PROVIDER_SITE_OTHER): Admitting: Family Medicine

## 2024-07-06 ENCOUNTER — Encounter: Payer: Self-pay | Admitting: Family Medicine

## 2024-07-06 ENCOUNTER — Other Ambulatory Visit (HOSPITAL_BASED_OUTPATIENT_CLINIC_OR_DEPARTMENT_OTHER): Payer: Self-pay

## 2024-07-06 VITALS — BP 100/68 | HR 70 | Temp 98.2°F | Resp 16 | Ht 64.0 in | Wt 122.0 lb

## 2024-07-06 DIAGNOSIS — G8929 Other chronic pain: Secondary | ICD-10-CM | POA: Diagnosis not present

## 2024-07-06 DIAGNOSIS — M25511 Pain in right shoulder: Secondary | ICD-10-CM | POA: Diagnosis not present

## 2024-07-06 MED ORDER — CYCLOBENZAPRINE HCL 10 MG PO TABS
10.0000 mg | ORAL_TABLET | Freq: Three times a day (TID) | ORAL | 0 refills | Status: AC | PRN
  Filled 2024-07-06: qty 30, 10d supply, fill #0

## 2024-07-06 MED ORDER — PREDNISONE 10 MG PO TABS
ORAL_TABLET | ORAL | 0 refills | Status: AC
  Filled 2024-07-06: qty 20, 12d supply, fill #0

## 2024-07-06 NOTE — Progress Notes (Signed)
 Subjective:    Patient ID: Adriana Lopez, female    DOB: [birth year], 63 y.o.   MRN: [id number]  Chief Complaint  Patient presents with   Arm Pain    Mostly right arm but now having left elbow pain, x2 weeks, no falls or injuries.     HPI Patient is in today for r shoulder pain.  Discussed the use of AI scribe software for clinical note transcription with the patient, who gave verbal consent to proceed.  History of Present Illness Adriana Lopez is a 63 year old female with a history of neck pain and bulging discs who presents with worsening neck and shoulder pain and headaches.  She has experienced neck and shoulder pain for approximately two years. An injection in her neck previously provided significant relief, but physical therapy was unhelpful. Recently, her pain has worsened, particularly when sleeping on her side, causing frequent awakenings due to discomfort.  The pain is primarily located in her shoulder and radiates to her neck, causing headaches. She describes her neck as 'really uncomfortable'. She has not taken any medication today, although she typically uses Advil or Tylenol. She also reports severe 'tennis elbow' pain, which was somewhat alleviated by a heat pad, though the area remains tender.  She engages in swimming and resistance band exercises, which seemed to exacerbate her discomfort, leading her to stop these activities. Additionally, she describes recent lower back pain, characterized by discomfort reminiscent of labor contractions, occurring every few seconds, with her back feeling sensitive and tight. Heat provides temporary relief.  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 An MRI of her left shoulder in 2022 included findings that could be associated with frozen shoulder, though the patient does not recall being told she had this diagnosis. She had an MRI of the neck in November 2022 and a shoulder MRI a  month later, revealing a partial tear, tendinitis, bursitis, synovitis, and arthritis in the [facility] joint.  She has not sustained any known injuries and denies any recent significant physical activity that could account for her symptoms. She has a history of a rotator cuff issue on the right side, which was resolved with physical therapy.    Past Medical History:  Diagnosis Date   Allergy     feline   Anxiety     Past Surgical History:  Procedure Laterality Date   REFRACTIVE SURGERY  2012   Dr Meridee   SKIN CANCER EXCISION     right ear lobe   TONSILLECTOMY AND ADENOIDECTOMY      Family History  Problem Relation Age of Onset   Alzheimer's disease Mother    Heart disease Father        pacer   Cancer Father        dermatologic   Cancer Maternal Aunt        uterine   Alzheimer's disease Maternal Aunt    Diabetes Neg Hx    Stroke Neg Hx    Hypertension Neg Hx    Colon cancer Neg Hx    Throat cancer Neg Hx    Rectal cancer Neg Hx    Pancreatic cancer Neg Hx     Social History   Socioeconomic History   Marital status: Divorced    Spouse name: Not on file   Number of children: 2   Years of education: Not on file   Highest education level: Master's degree (e.g., MA, MS, MEng, MEd, MSW, MBA)  Occupational History    Employer: [employer]  Tobacco Use   Smoking status: Former    Types: Cigarettes   Smokeless tobacco: Never  Vaping Use   Vaping status: Never Used  Substance and Sexual Activity   Alcohol use: Yes    Alcohol/week: 7.0 standard drinks of alcohol    Types: 7 Glasses of wine per week   Drug use: No   Sexual activity: Yes  Other Topics Concern   Not on file  Social History Narrative   Exercise- walking   Social Drivers of Health   Financial Resource Strain: Low Risk  (07/05/2024)   Overall Financial Resource Strain (CARDIA)    Difficulty of Paying Living Expenses: Not very hard  Food Insecurity: No Food Insecurity (07/05/2024)   Hunger  Vital Sign    Worried About Running Out of Food in the Last Year: Never true    Ran Out of Food in the Last Year: Never true  Transportation Needs: No Transportation Needs (07/05/2024)   PRAPARE - Administrator, Civil Service (Medical): No    Lack of Transportation (Non-Medical): No  Physical Activity: Sufficiently Active (07/05/2024)   Exercise Vital Sign    Days of Exercise per Week: 7 days    Minutes of Exercise per Session: 30 min  Stress: Stress Concern Present (07/05/2024)   [employer] of Occupational Health - Occupational Stress Questionnaire    Feeling of Stress: To some extent  Social Connections: Moderately Integrated (07/05/2024)   Social Connection and Isolation Panel    Frequency of Communication with Friends and Family: More than three times a week    Frequency of Social Gatherings with Friends and Family: Once a week    Attends Religious Services: 1 to 4 times per year    Active Member of [employer] or Organizations: No    Attends Engineer, structural: Not on file    Marital Status: Living with partner  Intimate Partner Violence: Not on file    Outpatient Medications Prior to Visit  Medication Sig Dispense Refill   ALPRAZolam  (XANAX ) 0.25 MG tablet Take 1 tablet (0.25 mg total) by mouth 2 (two) times daily as needed for anxiety. 20 tablet 0   azelastine  (ASTELIN ) 0.1 % nasal spray Place 1 spray into both nostrils 2 (two) times daily. Use in each nostril as directed 30 mL 12   clotrimazole -betamethasone  (LOTRISONE ) cream Apply 1 Application topically 2 (two) times daily. 45 g 5   diclofenac  (CATAFLAM ) 50 MG tablet Take 1 tablet (50 mg total) by mouth 3 (three) times daily. 90 tablet 3   erythromycin  ophthalmic ointment Place 1 cm ribbon into both eyes 4 (four) times daily for 5 days. 3.5 g 0   estradiol  (VIVELLE -DOT) 0.05 MG/24HR patch Place 1 patch (0.05 mg total) onto the skin twice weekly 8 patch 5   estradiol  (VIVELLE -DOT) 0.05 MG/24HR patch Place 1  patch (0.05 mg total) onto the skin 2 (two) times a week. 24 patch 2   estradiol  (VIVELLE -DOT) 0.05 MG/24HR patch Place 1 patch (0.05 mg total) onto the skin 2 (two) times a week. 8 patch 3   progesterone  (PROMETRIUM ) 100 MG capsule Take 100 mg by mouth at bedtime.     progesterone  (PROMETRIUM ) 100 MG capsule Take 1 capsule (100 mg total) by mouth at bedtime. 90 capsule 1   progesterone  (PROMETRIUM ) 100 MG capsule Take 1 capsule (100 mg total) by mouth at bedtime. 90 capsule 3   triazolam  (HALCION ) 0.25 MG  tablet Take 1 tablet (0.25 mg total) by mouth once daily 1 hour prior to dental appt. bring bottle to appt 3 tablet 0   valACYclovir  (VALTREX ) 500 MG tablet Take 1 tablet (500 mg total) by mouth 2 (two) times daily until gone 20 tablet 1   predniSONE  (DELTASONE ) 20 MG tablet Take 1 tablet (20 mg total) by mouth daily with breakfast. 5 tablet 0   No facility-administered medications prior to visit.    Allergies  Allergen Reactions   Amoxicillin  Diarrhea    Also had C Diff    Review of Systems  Constitutional:  Negative for chills, fever and malaise/fatigue.  HENT:  Negative for congestion and hearing loss.   Eyes:  Negative for blurred vision and discharge.  Respiratory:  Negative for cough, sputum production and shortness of breath.   Cardiovascular:  Negative for chest pain, palpitations and leg swelling.  Gastrointestinal:  Negative for abdominal pain, blood in stool, constipation, diarrhea, heartburn, nausea and vomiting.  Genitourinary:  Negative for dysuria, frequency, hematuria and urgency.  Musculoskeletal:  Positive for joint pain. Negative for back pain, falls and myalgias.  Skin:  Negative for rash.  Neurological:  Negative for dizziness, sensory change, loss of consciousness, weakness and headaches.  Endo/Heme/Allergies:  Negative for environmental allergies. Does not bruise/bleed easily.  Psychiatric/Behavioral:  Negative for depression and suicidal ideas. The patient is  not nervous/anxious and does not have insomnia.        Objective:    Physical Exam Vitals and nursing note reviewed.  Musculoskeletal:        General: Tenderness present.     Right shoulder: Tenderness present. No swelling, deformity, effusion, laceration or bony tenderness. Decreased range of motion.       Arms:     Comments: +tenderness R deltoid and trap -- +knots      BP 100/68 (BP Location: Left Arm, Patient Position: Sitting, Cuff Size: Normal)   Pulse 70   Temp 98.2 F (36.8 C) (Oral)   Resp 16   Ht 5' 4 (1.626 m)   Wt 122 lb (55.3 kg)   LMP 06/10/2013   SpO2 99%   BMI 20.94 kg/m  Wt Readings from Last 3 Encounters:  07/06/24 122 lb (55.3 kg)  01/19/24 118 lb 6.4 oz (53.7 kg)  01/18/24 115 lb (52.2 kg)    Diabetic Foot Exam - Simple   No data filed    Lab Results  Component Value Date   WBC 7.5 04/26/2022   HGB 13.4 04/26/2022   HCT 39.5 04/26/2022   PLT 337 04/26/2022   GLUCOSE 82 04/26/2022   CHOL 203 (H) 04/26/2022   TRIG 39 04/26/2022   HDL 116 04/26/2022   LDLCALC 76 04/26/2022   ALT 8 04/26/2022   AST 17 04/26/2022   NA 138 04/26/2022   K 4.0 04/26/2022   CL 102 04/26/2022   CREATININE 0.68 04/26/2022   BUN 18 04/26/2022   CO2 25 04/26/2022   TSH 2.00 04/26/2022    Lab Results  Component Value Date   TSH 2.00 04/26/2022   Lab Results  Component Value Date   WBC 7.5 04/26/2022   HGB 13.4 04/26/2022   HCT 39.5 04/26/2022   MCV 99.0 04/26/2022   PLT 337 04/26/2022   Lab Results  Component Value Date   NA 138 04/26/2022   K 4.0 04/26/2022   CO2 25 04/26/2022   GLUCOSE 82 04/26/2022   BUN 18 04/26/2022   CREATININE 0.68 04/26/2022  BILITOT 0.9 04/26/2022   ALKPHOS 52 05/01/2021   AST 17 04/26/2022   ALT 8 04/26/2022   PROT 6.5 04/26/2022   ALBUMIN 4.2 05/01/2021   CALCIUM 9.0 04/26/2022   GFR 94.97 05/01/2021   Lab Results  Component Value Date   CHOL 203 (H) 04/26/2022   Lab Results  Component Value Date   HDL  116 04/26/2022   Lab Results  Component Value Date   LDLCALC 76 04/26/2022   Lab Results  Component Value Date   TRIG 39 04/26/2022   Lab Results  Component Value Date   CHOLHDL 1.8 04/26/2022   No results found for: HGBA1C     Assessment & Plan:  Chronic right shoulder pain -     predniSONE ; Take 3 tablets (30 mg total) daily for 3 days, THEN 2 tablets (20 mg total) daily for 3 days, THEN 1 tablet (10 mg total) daily for 3 days, THEN 0.5 tablets (5 mg total) daily for 3 days.  Dispense: 20 tablet; Refill: 0 -     Cyclobenzaprine  HCl; Take 1 tablet (10 mg total) by mouth 3 (three) times daily as needed for muscle spasms.  Dispense: 30 tablet; Refill: 0  Assessment and Plan Assessment & Plan Chronic left shoulder pain with associated neck pain and headaches   Chronic left shoulder pain with neck pain and headaches is likely muscular in origin. 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 Symptoms worsen when sleeping on the side and may be related to muscle tightness. The differential includes muscular strain versus exacerbation of underlying degenerative changes. She reports significant discomfort and headaches, with pain radiating from the shoulder to the neck. Prescribe a prednisone  taper for inflammation and a muscle relaxer for nighttime use to aid sleep. Recommend deep tissue massage therapy.  Chronic low back pain   Chronic low back pain presents with episodes of discomfort and sensitivity, likely muscular. No recent injury is reported. Symptoms include tightness and discomfort, with occasional severe episodes resembling contractions. She uses a heat pad for relief, which provides temporary comfort. Continue using the heat pad for relief.  Left elbow pain (possible lateral epicondylitis)   Left elbow pain is severe and tender, possibly related to lateral  epicondylitis. No recent activity or injury is reported that could have exacerbated the condition. She reports significant tenderness and has been using a heat pad for relief. Continue using the heat pad for relief.    Toby Ayad R Lowne Chase, DO

## 2024-07-21 ENCOUNTER — Ambulatory Visit (INDEPENDENT_AMBULATORY_CARE_PROVIDER_SITE_OTHER): Admitting: Psychology

## 2024-07-21 DIAGNOSIS — F4321 Adjustment disorder with depressed mood: Secondary | ICD-10-CM

## 2024-07-21 NOTE — Progress Notes (Signed)
 [facility] Counselor/Therapist Progress Note  Patient ID: Dejana Pugsley, MRN: [id number],    Date: 07/21/2024  Time Spent: 5:00pm-5:55pm  55 minutes   Treatment Type: Individual Therapy  Reported Symptoms: stress  Mental Status Exam: Appearance:  Casual     Behavior: Appropriate  Motor: Normal  Speech/Language:  Normal Rate  Affect: Appropriate  Mood: normal  Thought process: normal  Thought content:   WNL  Sensory/Perceptual disturbances:   WNL  Orientation: oriented to person, place, time/date, and situation  Attention: Good  Concentration: Good  Memory: WNL  Fund of knowledge:  Good  Insight:   Good  Judgment:  Good  Impulse Control: Good   Risk Assessment: Danger to Self:  No Self-injurious Behavior: No Danger to Others: No Duty to Warn:no Physical Aggression / Violence:No  Access to Firearms a concern: No  Gang Involvement:No   Subjective: Pt present for face-to-face individual therapy via video.  Pt consents to telehealth video session and is aware of limitations and benefits of virtual sessions.  Location of pt: home Location of therapist: home office.   Pt talked about the loss of her best friend Darice.   There will be a memorial service in November in [place]  that pt and [name] will attend.   Pt feels she needs this to help with her grief.  Helped pt process her feelings and grief.   Pt talked about her daughter [name] who can be very challenging to interact with at times.  Addressed their dynamics and how they impact pt. [name] is upset that she has not been invited to [employer] and is giving pt a hard time about it.   [name] is not considering pt's feelings about losing her best friend. Addressed how pt can set healthy boundaries.  Pt talked about her relationship with [name].  He is still not working which bothers pt and he is not using his time at home as well as pt would like.  They had an argument this week and pt wants to talk with  [name] about it.  Addressed how pt can express her feelings and what she needs from [facility].    Worked on self care strategies. Provided supportive therapy.   Interventions: Cognitive Behavioral Therapy and Insight-Oriented  Diagnosis:  F43.21  Plan of Care: Recommend ongoing therapy.  Pt participated in setting treatment goals.  Plan to continue to meet monthly.  Pt agrees with treatment plan.  Treatment Plan  (Treatment Plan Target Date:  05/28/2025) Client Abilities/Strengths  Pt is bright, engaging, and motivated for therapy.   Client Treatment Preferences  Individual therapy.  Client Statement of Needs  Improve coping skills.  Symptoms  Depressed or irritable mood.  Problems Addressed  Unipolar Depression Goals 1. Alleviate depressive symptoms and return to previous level of effective functioning. 2. Appropriately grieve the loss in order to normalize mood and to return to previously adaptive level of functioning. Objective Learn and implement behavioral strategies to overcome depression. Target Date: 2025-05-28 Frequency: Monthly  Progress: 60 Modality: individual  Related Interventions Engage the client in behavioral activation, increasing his/her activity level and contact with sources of reward, while identifying processes that inhibit activation.  Use behavioral techniques such as instruction, rehearsal, role-playing, role reversal, as needed, to facilitate activity in the client's daily life; reinforce success. Assist the client in developing skills that increase the likelihood of deriving pleasure from behavioral activation (e.g., assertiveness skills, developing an exercise plan, less internal/more external focus, increased social involvement); reinforce  success. Objective Identify important people in life, past and present, and describe the quality, good and poor, of those relationships. Target Date: 2025-05-28 Frequency: Monthly  Progress: 60 Modality: individual   Related Interventions Conduct Interpersonal Therapy beginning with the assessment of the client's interpersonal inventory of important past and present relationships; develop a case formulation linking depression to grief, interpersonal role disputes, role transitions, and/or interpersonal deficits). Objective Learn and implement problem-solving and decision-making skills. Target Date: 2025-05-28 Frequency: Monthly  Progress: 60 Modality: individual  Related Interventions Conduct Problem-Solving Therapy using techniques such as psychoeducation, modeling, and role-playing to teach client problem-solving skills (i.e., defining a problem specifically, generating possible solutions, evaluating the pros and cons of each solution, selecting and implementing a plan of action, evaluating the efficacy of the plan, accepting or revising the plan); role-play application of the problem-solving skill to a real life issue. Encourage in the client the development of a positive problem orientation in which problems and solving them are viewed as a natural part of life and not something to be feared, despaired, or avoided. 3. Develop healthy interpersonal relationships that lead to the alleviation and help prevent the relapse of depression. 4. Develop healthy thinking patterns and beliefs about self, others, and the world that lead to the alleviation and help prevent the relapse of depression. 5. Recognize, accept, and cope with feelings of depression. Diagnosis F43.21  Conditions For Discharge Achievement of treatment goals and objectives   [name], LCSW

## 2024-07-22 ENCOUNTER — Ambulatory Visit: Admitting: Psychology

## 2024-08-19 ENCOUNTER — Ambulatory Visit: Admitting: Psychology

## 2024-08-19 DIAGNOSIS — F4321 Adjustment disorder with depressed mood: Secondary | ICD-10-CM | POA: Diagnosis not present

## 2024-08-19 NOTE — Progress Notes (Signed)
 [facility] Counselor/Therapist Progress Note  Patient ID: Adriana Lopez, MRN: [id number],    Date: 08/19/2024  Time Spent: 5:00pm-5:55pm  55 minutes   Treatment Type: Individual Therapy  Reported Symptoms: stress  Mental Status Exam: Appearance:  Casual     Behavior: Appropriate  Motor: Normal  Speech/Language:  Normal Rate  Affect: Appropriate  Mood: normal  Thought process: normal  Thought content:   WNL  Sensory/Perceptual disturbances:   WNL  Orientation: oriented to person, place, time/date, and situation  Attention: Good  Concentration: Good  Memory: WNL  Fund of knowledge:  Good  Insight:   Good  Judgment:  Good  Impulse Control: Good   Risk Assessment: Danger to Self:  No Self-injurious Behavior: No Danger to Others: No Duty to Warn:no Physical Aggression / Violence:No  Access to Firearms a concern: No  Gang Involvement:No   Subjective: Pt present for face-to-face individual therapy via video.  Pt consents to telehealth video session and is aware of limitations and benefits of virtual sessions.  Location of pt: home Location of therapist: home office.   Pt talked about having a lot of time with her son [name].  She has had to help him with car repairs.  It has been stressful but pt is glad to have time with [facility].  Pt talked about her daughter [name].   [name] relies on pt to help with child care for baby Junnie.  [name] has been stressed and overwhelmed and tends to take it out on pt.  Addressed the relationship dynamics and worked on how pt can set healthy boundaries.  Pt talked about her relationship with [name].  They set a wedding date for December 20th.  It will be a small ceremony but pt is excited about it.  [name] is still not working which is upsetting to pt but she gives him a list of things to do for her and the house each day and he has been doing it. In a couple of weeks pt will attend the celebration of life service for her friend Darice.  Pt  and [name] will travel to [place]  to attend.   Pt feels very sad about the loss of Darice.  Helped pt process her grief.  Worked on self care strategies. Provided supportive therapy.   Interventions: Cognitive Behavioral Therapy and Insight-Oriented  Diagnosis:  F43.21  Plan of Care: Recommend ongoing therapy.  Pt participated in setting treatment goals.  Plan to continue to meet monthly.  Pt agrees with treatment plan.  Treatment Plan  (Treatment Plan Target Date:  05/28/2025) Client Abilities/Strengths  Pt is bright, engaging, and motivated for therapy.   Client Treatment Preferences  Individual therapy.  Client Statement of Needs  Improve coping skills.  Symptoms  Depressed or irritable mood.  Problems Addressed  Unipolar Depression Goals 1. Alleviate depressive symptoms and return to previous level of effective functioning. 2. Appropriately grieve the loss in order to normalize mood and to return to previously adaptive level of functioning. Objective Learn and implement behavioral strategies to overcome depression. Target Date: 2025-05-28 Frequency: Monthly  Progress: 60 Modality: individual  Related Interventions Engage the client in behavioral activation, increasing his/her activity level and contact with sources of reward, while identifying processes that inhibit activation.  Use behavioral techniques such as instruction, rehearsal, role-playing, role reversal, as needed, to facilitate activity in the client's daily life; reinforce success. Assist the client in developing skills that increase the likelihood of deriving pleasure from behavioral activation (e.g., assertiveness  skills, developing an exercise plan, less internal/more external focus, increased social involvement); reinforce success. Objective Identify important people in life, past and present, and describe the quality, good and poor, of those relationships. Target Date: 2025-05-28 Frequency: Monthly  Progress: 60  Modality: individual  Related Interventions Conduct Interpersonal Therapy beginning with the assessment of the client's interpersonal inventory of important past and present relationships; develop a case formulation linking depression to grief, interpersonal role disputes, role transitions, and/or interpersonal deficits). Objective Learn and implement problem-solving and decision-making skills. Target Date: 2025-05-28 Frequency: Monthly  Progress: 60 Modality: individual  Related Interventions Conduct Problem-Solving Therapy using techniques such as psychoeducation, modeling, and role-playing to teach client problem-solving skills (i.e., defining a problem specifically, generating possible solutions, evaluating the pros and cons of each solution, selecting and implementing a plan of action, evaluating the efficacy of the plan, accepting or revising the plan); role-play application of the problem-solving skill to a real life issue. Encourage in the client the development of a positive problem orientation in which problems and solving them are viewed as a natural part of life and not something to be feared, despaired, or avoided. 3. Develop healthy interpersonal relationships that lead to the alleviation and help prevent the relapse of depression. 4. Develop healthy thinking patterns and beliefs about self, others, and the world that lead to the alleviation and help prevent the relapse of depression. 5. Recognize, accept, and cope with feelings of depression. Diagnosis F43.21  Conditions For Discharge Achievement of treatment goals and objectives   [name], LCSW

## 2024-09-09 ENCOUNTER — Other Ambulatory Visit: Payer: Self-pay | Admitting: Family Medicine

## 2024-09-09 DIAGNOSIS — B001 Herpesviral vesicular dermatitis: Secondary | ICD-10-CM

## 2024-09-09 DIAGNOSIS — F419 Anxiety disorder, unspecified: Secondary | ICD-10-CM

## 2024-09-09 NOTE — Telephone Encounter (Signed)
 Requesting: alprazolam  0.25mg  Contract:06/19/21 UDS:06/19/21 Last Visit:07/06/24 Next Visit: None Last Refill: 12/16/23 #20 and 0RF   Please Advise

## 2024-09-10 ENCOUNTER — Other Ambulatory Visit (HOSPITAL_BASED_OUTPATIENT_CLINIC_OR_DEPARTMENT_OTHER): Payer: Self-pay

## 2024-09-10 ENCOUNTER — Other Ambulatory Visit: Payer: Self-pay

## 2024-09-10 MED ORDER — ALPRAZOLAM 0.25 MG PO TABS
0.2500 mg | ORAL_TABLET | Freq: Two times a day (BID) | ORAL | 0 refills | Status: AC | PRN
  Filled 2024-09-10: qty 20, 10d supply, fill #0

## 2024-09-14 ENCOUNTER — Ambulatory Visit (INDEPENDENT_AMBULATORY_CARE_PROVIDER_SITE_OTHER): Admitting: Psychology

## 2024-09-14 DIAGNOSIS — F4321 Adjustment disorder with depressed mood: Secondary | ICD-10-CM

## 2024-09-14 NOTE — Progress Notes (Signed)
 [facility] Counselor/Therapist Progress Note  Patient ID: Adriana Lopez, MRN: [id number],    Date: 09/14/2024  Time Spent: 5:00pm-5:55pm  55 minutes   Treatment Type: Individual Therapy  Reported Symptoms: stress  Mental Status Exam: Appearance:  Casual     Behavior: Appropriate  Motor: Normal  Speech/Language:  Normal Rate  Affect: Appropriate  Mood: normal  Thought process: normal  Thought content:   WNL  Sensory/Perceptual disturbances:   WNL  Orientation: oriented to person, place, time/date, and situation  Attention: Good  Concentration: Good  Memory: WNL  Fund of knowledge:  Good  Insight:   Good  Judgment:  Good  Impulse Control: Good   Risk Assessment: Danger to Self:  No Self-injurious Behavior: No Danger to Others: No Duty to Warn:no Physical Aggression / Violence:No  Access to Firearms a concern: No  Gang Involvement:No   Subjective: Pt present for face-to-face individual therapy via video.  Pt consents to telehealth video session and is aware of limitations and benefits of virtual sessions.  Location of pt: home Location of therapist: home office.   Pt talked about attending her friend Adriana Lopez celebration of life ceremony last week.   Pt and Adriana Lopez traveled to [place]  to attend.  Pt states it was very hard for her.  Pt did not know a lot of the people there so she felt like an outsider.   Pt states she never feels like she fits in.   Helped pt process her feelings.   Addressed pt's grief.  She still misses Adriana Lopez very much. Pt talked about her family.   Adriana Lopez continues to be very difficult to deal with.   She is demanding and pt is working on setting boundaries with her.   Pt's son Adriana Lopez has been spending a lot of time with pt and Mark.   Pt loves that Adriana Lopez wants to be with them but also needs to time to herself.   Addressed how pt can communicate her needs.  Pt and Adriana Lopez have scheduled their wedding for December 20th.   It will be a very small  wedding at [facility] sister's house.  As pt and Adriana Lopez have been planning the wedding they have shared more about their pasts.   Pt is glad that Adriana Lopez is opening up to her more. Pt talked about the holidays.   Her kids do not get along but pt requested they both be present for Thanksgiving and put their differences aside.   Last year was a bad blow up with Adriana Lopez so pt is nervous about how she will behave. Worked on self care strategies. Provided supportive therapy.   Interventions: Cognitive Behavioral Therapy and Insight-Oriented  Diagnosis:  F43.21  Plan of Care: Recommend ongoing therapy.  Pt participated in setting treatment goals.  Plan to continue to meet monthly.  Pt agrees with treatment plan.  Treatment Plan  (Treatment Plan Target Date:  05/28/2025) Client Abilities/Strengths  Pt is bright, engaging, and motivated for therapy.   Client Treatment Preferences  Individual therapy.  Client Statement of Needs  Improve coping skills.  Symptoms  Depressed or irritable mood.  Problems Addressed  Unipolar Depression Goals 1. Alleviate depressive symptoms and return to previous level of effective functioning. 2. Appropriately grieve the loss in order to normalize mood and to return to previously adaptive level of functioning. Objective Learn and implement behavioral strategies to overcome depression. Target Date: 2025-05-28 Frequency: Monthly  Progress: 60 Modality: individual  Related Interventions Engage the client in behavioral  activation, increasing his/her activity level and contact with sources of reward, while identifying processes that inhibit activation.  Use behavioral techniques such as instruction, rehearsal, role-playing, role reversal, as needed, to facilitate activity in the client's daily life; reinforce success. Assist the client in developing skills that increase the likelihood of deriving pleasure from behavioral activation (e.g., assertiveness skills, developing an  exercise plan, less internal/more external focus, increased social involvement); reinforce success. Objective Identify important people in life, past and present, and describe the quality, good and poor, of those relationships. Target Date: 2025-05-28 Frequency: Monthly  Progress: 60 Modality: individual  Related Interventions Conduct Interpersonal Therapy beginning with the assessment of the client's interpersonal inventory of important past and present relationships; develop a case formulation linking depression to grief, interpersonal role disputes, role transitions, and/or interpersonal deficits). Objective Learn and implement problem-solving and decision-making skills. Target Date: 2025-05-28 Frequency: Monthly  Progress: 60 Modality: individual  Related Interventions Conduct Problem-Solving Therapy using techniques such as psychoeducation, modeling, and role-playing to teach client problem-solving skills (i.e., defining a problem specifically, generating possible solutions, evaluating the pros and cons of each solution, selecting and implementing a plan of action, evaluating the efficacy of the plan, accepting or revising the plan); role-play application of the problem-solving skill to a real life issue. Encourage in the client the development of a positive problem orientation in which problems and solving them are viewed as a natural part of life and not something to be feared, despaired, or avoided. 3. Develop healthy interpersonal relationships that lead to the alleviation and help prevent the relapse of depression. 4. Develop healthy thinking patterns and beliefs about self, others, and the world that lead to the alleviation and help prevent the relapse of depression. 5. Recognize, accept, and cope with feelings of depression. Diagnosis F43.21  Conditions For Discharge Achievement of treatment goals and objectives   Adriana Alma, LCSW

## 2024-10-04 ENCOUNTER — Other Ambulatory Visit (HOSPITAL_BASED_OUTPATIENT_CLINIC_OR_DEPARTMENT_OTHER): Payer: Self-pay

## 2024-10-07 ENCOUNTER — Other Ambulatory Visit (HOSPITAL_BASED_OUTPATIENT_CLINIC_OR_DEPARTMENT_OTHER): Payer: Self-pay

## 2024-10-07 ENCOUNTER — Other Ambulatory Visit: Payer: Self-pay | Admitting: Family Medicine

## 2024-10-07 DIAGNOSIS — B001 Herpesviral vesicular dermatitis: Secondary | ICD-10-CM

## 2024-10-07 MED ORDER — VALACYCLOVIR HCL 500 MG PO TABS
500.0000 mg | ORAL_TABLET | Freq: Two times a day (BID) | ORAL | 1 refills | Status: AC
  Filled 2024-10-07: qty 20, 10d supply, fill #0
  Filled 2024-10-25: qty 20, 10d supply, fill #1

## 2024-10-14 ENCOUNTER — Ambulatory Visit: Admitting: Psychology

## 2024-10-14 DIAGNOSIS — F4321 Adjustment disorder with depressed mood: Secondary | ICD-10-CM

## 2024-10-14 NOTE — Progress Notes (Signed)
 [facility] Counselor/Therapist Progress Note  Patient ID: Adriana Lopez, MRN: [id number],    Date: 10/14/2024  Time Spent: 4:00pm-4:55pm  55 minutes   Treatment Type: Individual Therapy  Reported Symptoms: stress  Mental Status Exam: Appearance:  Casual     Behavior: Appropriate  Motor: Normal  Speech/Language:  Normal Rate  Affect: Appropriate  Mood: normal  Thought process: normal  Thought content:   WNL  Sensory/Perceptual disturbances:   WNL  Orientation: oriented to person, place, time/date, and situation  Attention: Good  Concentration: Good  Memory: WNL  Fund of knowledge:  Good  Insight:   Good  Judgment:  Good  Impulse Control: Good   Risk Assessment: Danger to Self:  No Self-injurious Behavior: No Danger to Others: No Duty to Warn:no Physical Aggression / Violence:No  Access to Firearms a concern: No  Gang Involvement:No   Subjective: Pt present for face-to-face individual therapy via video.  Pt consents to telehealth video session and is aware of limitations and benefits of virtual sessions.  Location of pt: home Location of therapist: home office.   Pt talked about feeling very emotional this week.  Pt is stressed about her and Mark's wedding on December 20th.   Pt feels like they have not planned well and [name] has not been much help.   Pt also has trouble asking for help.  Addressed the wedding stress and worked on optician, dispensing.  The wedding will be at [facility] sister Kathy's house and she is being helpful and decorating nicely.   Pt has been getting a lot of affirmation from work and people in her life about how special she is.  Encouraged pt to embrace this since she can tend to be hard on herself.   Worked on self care strategies. Provided supportive therapy.   Interventions: Cognitive Behavioral Therapy and Insight-Oriented  Diagnosis:  F43.21  Plan of Care: Recommend ongoing therapy.  Pt participated in setting treatment  goals.  Plan to continue to meet monthly.  Pt agrees with treatment plan.  Treatment Plan  (Treatment Plan Target Date:  05/28/2025) Client Abilities/Strengths  Pt is bright, engaging, and motivated for therapy.   Client Treatment Preferences  Individual therapy.  Client Statement of Needs  Improve coping skills.  Symptoms  Depressed or irritable mood.  Problems Addressed  Unipolar Depression Goals 1. Alleviate depressive symptoms and return to previous level of effective functioning. 2. Appropriately grieve the loss in order to normalize mood and to return to previously adaptive level of functioning. Objective Learn and implement behavioral strategies to overcome depression. Target Date: 2025-05-28 Frequency: Monthly  Progress: 60 Modality: individual  Related Interventions Engage the client in behavioral activation, increasing his/her activity level and contact with sources of reward, while identifying processes that inhibit activation.  Use behavioral techniques such as instruction, rehearsal, role-playing, role reversal, as needed, to facilitate activity in the client's daily life; reinforce success. Assist the client in developing skills that increase the likelihood of deriving pleasure from behavioral activation (e.g., assertiveness skills, developing an exercise plan, less internal/more external focus, increased social involvement); reinforce success. Objective Identify important people in life, past and present, and describe the quality, good and poor, of those relationships. Target Date: 2025-05-28 Frequency: Monthly  Progress: 60 Modality: individual  Related Interventions Conduct Interpersonal Therapy beginning with the assessment of the client's interpersonal inventory of important past and present relationships; develop a case formulation linking depression to grief, interpersonal role disputes, role transitions, and/or interpersonal deficits). Objective Learn  and  implement problem-solving and decision-making skills. Target Date: 2025-05-28 Frequency: Monthly  Progress: 60 Modality: individual  Related Interventions Conduct Problem-Solving Therapy using techniques such as psychoeducation, modeling, and role-playing to teach client problem-solving skills (i.e., defining a problem specifically, generating possible solutions, evaluating the pros and cons of each solution, selecting and implementing a plan of action, evaluating the efficacy of the plan, accepting or revising the plan); role-play application of the problem-solving skill to a real life issue. Encourage in the client the development of a positive problem orientation in which problems and solving them are viewed as a natural part of life and not something to be feared, despaired, or avoided. 3. Develop healthy interpersonal relationships that lead to the alleviation and help prevent the relapse of depression. 4. Develop healthy thinking patterns and beliefs about self, others, and the world that lead to the alleviation and help prevent the relapse of depression. 5. Recognize, accept, and cope with feelings of depression. Diagnosis F43.21  Conditions For Discharge Achievement of treatment goals and objectives   [name], LCSW

## 2024-10-25 ENCOUNTER — Other Ambulatory Visit (HOSPITAL_BASED_OUTPATIENT_CLINIC_OR_DEPARTMENT_OTHER): Payer: Self-pay

## 2024-10-25 MED ORDER — ESTRADIOL 0.05 MG/24HR TD PTTW
1.0000 | MEDICATED_PATCH | TRANSDERMAL | 1 refills | Status: AC
  Filled 2024-10-25: qty 24, 84d supply, fill #0

## 2024-10-25 MED ORDER — ESTRADIOL 0.05 MG/24HR TD PTTW
1.0000 | MEDICATED_PATCH | TRANSDERMAL | 1 refills | Status: AC
  Filled 2024-10-25: qty 8, 28d supply, fill #0

## 2024-10-26 ENCOUNTER — Other Ambulatory Visit (HOSPITAL_BASED_OUTPATIENT_CLINIC_OR_DEPARTMENT_OTHER): Payer: Self-pay

## 2024-11-09 ENCOUNTER — Ambulatory Visit: Admitting: Psychology

## 2024-11-09 DIAGNOSIS — F4321 Adjustment disorder with depressed mood: Secondary | ICD-10-CM

## 2024-11-09 NOTE — Progress Notes (Signed)
 "  [facility] Counselor/Therapist Progress Note  Patient ID: Adriana Lopez, MRN: [id number],    Date: 11/09/2024  Time Spent: 4:00pm-4:55pm  55 minutes   Treatment Type: Individual Therapy  Reported Symptoms: stress  Mental Status Exam: Appearance:  Casual     Behavior: Appropriate  Motor: Normal  Speech/Language:  Normal Rate  Affect: Appropriate  Mood: normal  Thought process: normal  Thought content:   WNL  Sensory/Perceptual disturbances:   WNL  Orientation: oriented to person, place, time/date, and situation  Attention: Good  Concentration: Good  Memory: WNL  Fund of knowledge:  Good  Insight:   Good  Judgment:  Good  Impulse Control: Good   Risk Assessment: Danger to Self:  No Self-injurious Behavior: No Danger to Others: No Duty to Warn:no Physical Aggression / Violence:No  Access to Firearms a concern: No  Gang Involvement:No   Subjective: Pt present for face-to-face individual therapy via video.  Pt consents to telehealth video session and is aware of limitations and benefits of virtual sessions.  Location of pt: home Location of therapist: home office.   Pt talked about her wedding.  Pt states it was perfect.   Pt shared about the wedding and the special moments.   Pt feels very happy that she is married to Adriana Lopez. Pt is relieved that her son Adriana Lopez and her daughter Adriana Lopez are getting along now.   Pt knows things are always tenuous with Adriana Lopez bc she has such a bad temper but she hopes that they all continue to get along.   Pt is still worried that Adriana Lopez does not have a job.  She plans to talk with him about making securing a job a priority for 2026.  Pt has been getting a lot of affirmation from work and people in her life about how special she is.  Pt was selected as runner, broadcasting/film/video of the month. Encouraged pt to embrace this since she can tend to be hard on herself.   Worked on self care strategies. Provided supportive therapy.   Interventions:  Cognitive Behavioral Therapy and Insight-Oriented  Diagnosis:  F43.21  Plan of Care: Recommend ongoing therapy.  Pt participated in setting treatment goals.  Plan to continue to meet monthly.  Pt agrees with treatment plan.  Treatment Plan  (Treatment Plan Target Date:  05/28/2025) Client Abilities/Strengths  Pt is bright, engaging, and motivated for therapy.   Client Treatment Preferences  Individual therapy.  Client Statement of Needs  Improve coping skills.  Symptoms  Depressed or irritable mood.  Problems Addressed  Unipolar Depression Goals 1. Alleviate depressive symptoms and return to previous level of effective functioning. 2. Appropriately grieve the loss in order to normalize mood and to return to previously adaptive level of functioning. Objective Learn and implement behavioral strategies to overcome depression. Target Date: 2025-05-28 Frequency: Monthly  Progress: 60 Modality: individual  Related Interventions Engage the client in behavioral activation, increasing his/her activity level and contact with sources of reward, while identifying processes that inhibit activation.  Use behavioral techniques such as instruction, rehearsal, role-playing, role reversal, as needed, to facilitate activity in the client's daily life; reinforce success. Assist the client in developing skills that increase the likelihood of deriving pleasure from behavioral activation (e.g., assertiveness skills, developing an exercise plan, less internal/more external focus, increased social involvement); reinforce success. Objective Identify important people in life, past and present, and describe the quality, good and poor, of those relationships. Target Date: 2025-05-28 Frequency: Monthly  Progress: 60 Modality:  individual  Related Interventions Conduct Interpersonal Therapy beginning with the assessment of the client's interpersonal inventory of important past and present relationships; develop  a case formulation linking depression to grief, interpersonal role disputes, role transitions, and/or interpersonal deficits). Objective Learn and implement problem-solving and decision-making skills. Target Date: 2025-05-28 Frequency: Monthly  Progress: 60 Modality: individual  Related Interventions Conduct Problem-Solving Therapy using techniques such as psychoeducation, modeling, and role-playing to teach client problem-solving skills (i.e., defining a problem specifically, generating possible solutions, evaluating the pros and cons of each solution, selecting and implementing a plan of action, evaluating the efficacy of the plan, accepting or revising the plan); role-play application of the problem-solving skill to a real life issue. Encourage in the client the development of a positive problem orientation in which problems and solving them are viewed as a natural part of life and not something to be feared, despaired, or avoided. 3. Develop healthy interpersonal relationships that lead to the alleviation and help prevent the relapse of depression. 4. Develop healthy thinking patterns and beliefs about self, others, and the world that lead to the alleviation and help prevent the relapse of depression. 5. Recognize, accept, and cope with feelings of depression. Diagnosis F43.21  Conditions For Discharge Achievement of treatment goals and objectives   [name], LCSW   "

## 2024-12-01 ENCOUNTER — Ambulatory Visit: Payer: Self-pay | Admitting: *Deleted

## 2024-12-01 NOTE — Telephone Encounter (Signed)
 CAL notified of patient requesting appt instead of going to ED.   FYI Only or Action Required?: FYI only for provider: ED advised and patient requesting appt.  Patient was last seen in primary care on 07/06/2024 by [name], Jamee SAUNDERS, DO.  Called Nurse Triage reporting chest tightness, sinus pain right ear pain.  Symptoms began a week ago.  Interventions attempted: OTC medications: nyquil and Rest, hydration, or home remedies.  Symptoms are: gradually worsening.  Triage Disposition: Go to ED Now (or PCP Triage)  Patient/caregiver understands and will follow disposition?: No, wishes to speak with PCP                 Reason for Disposition  [1] Chest pain lasts > 5 minutes AND [2] occurred in past 3 days (72 hours) (Exception: Feels exactly the same as previously diagnosed heartburn and has accompanying sour taste in mouth.)  Answer Assessment - Initial Assessment Questions Recommended ED due to chest tightness and not going away. No other sx reported except sinus pain stuffiness, pain right ear. Patient requesting appt with PCP. Please advise. Patient reports she is a engineer, site and difficulty getting off work . Requesting appt after 4 pm if possible. CAL notified patient requesting appt .       1. LOCATION: Where does it hurt?       Chest  2. RADIATION: Does the pain go anywhere else? (e.g., into neck, jaw, arms, back)     No chest tightness  3. ONSET: When did the chest pain begin? (Minutes, hours or days)       1 week ago  4. PATTERN: Does the pain come and go, or has it been constant since it started?  Does it get worse with exertion?       Comes and goes but more constant  5. DURATION: How long does it last (e.g., seconds, minutes, hours)     On going  6. SEVERITY: How bad is the pain?  (e.g., Scale 1-10; mild, moderate, or severe)     Did not report as severe but tightness not going away 7. CARDIAC RISK FACTORS: Do you have any history  of heart problems or risk factors for heart disease? (e.g., angina, prior heart attack; diabetes, high blood pressure, high cholesterol, smoker, or strong family history of heart disease)     Na  8. PULMONARY RISK FACTORS: Do you have any history of lung disease?  (e.g., blood clots in lung, asthma, emphysema, birth control pills)     Na  9. CAUSE: What do you think is causing the chest pain?     Nasal congestion right ear pain stuffiness, sinus pain 10. OTHER SYMPTOMS: Do you have any other symptoms? (e.g., dizziness, nausea, vomiting, sweating, fever, difficulty breathing, cough)       Chest tightness. No pain in chest, no dizziness, no N/V no sweating, no difficulty breathing. No fever. No cough. Sinus pain, congestion blows nose clear mucus. No cough, no sore throat.  Protocols used: Chest Pain-A-AH

## 2024-12-02 ENCOUNTER — Ambulatory Visit: Payer: Self-pay

## 2024-12-02 ENCOUNTER — Other Ambulatory Visit (HOSPITAL_BASED_OUTPATIENT_CLINIC_OR_DEPARTMENT_OTHER): Payer: Self-pay

## 2024-12-02 ENCOUNTER — Ambulatory Visit: Payer: Self-pay | Admitting: Medical

## 2024-12-02 ENCOUNTER — Encounter: Payer: Self-pay | Admitting: Medical

## 2024-12-02 ENCOUNTER — Ambulatory Visit: Admitting: Medical

## 2024-12-02 VITALS — BP 128/78 | HR 84 | Temp 98.3°F | Resp 16 | Ht 64.0 in | Wt 120.0 lb

## 2024-12-02 DIAGNOSIS — R0789 Other chest pain: Secondary | ICD-10-CM

## 2024-12-02 DIAGNOSIS — J069 Acute upper respiratory infection, unspecified: Secondary | ICD-10-CM

## 2024-12-02 DIAGNOSIS — Z87891 Personal history of nicotine dependence: Secondary | ICD-10-CM

## 2024-12-02 DIAGNOSIS — R0981 Nasal congestion: Secondary | ICD-10-CM

## 2024-12-02 DIAGNOSIS — R5383 Other fatigue: Secondary | ICD-10-CM

## 2024-12-02 DIAGNOSIS — J3489 Other specified disorders of nose and nasal sinuses: Secondary | ICD-10-CM

## 2024-12-02 DIAGNOSIS — Z20828 Contact with and (suspected) exposure to other viral communicable diseases: Secondary | ICD-10-CM

## 2024-12-02 LAB — COMPREHENSIVE METABOLIC PANEL WITH GFR
ALT: 9 U/L (ref 3–35)
AST: 16 U/L (ref 5–37)
Albumin: 4.4 g/dL (ref 3.5–5.2)
Alkaline Phosphatase: 56 U/L (ref 39–117)
BUN: 11 mg/dL (ref 6–23)
CO2: 33 meq/L — ABNORMAL HIGH (ref 19–32)
Calcium: 9.1 mg/dL (ref 8.4–10.5)
Chloride: 102 meq/L (ref 96–112)
Creatinine, Ser: 0.59 mg/dL (ref 0.40–1.20)
GFR: 95.14 mL/min
Glucose, Bld: 103 mg/dL — ABNORMAL HIGH (ref 70–99)
Potassium: 4.2 meq/L (ref 3.5–5.1)
Sodium: 138 meq/L (ref 135–145)
Total Bilirubin: 0.7 mg/dL (ref 0.2–1.2)
Total Protein: 6.7 g/dL (ref 6.0–8.3)

## 2024-12-02 LAB — CBC WITH DIFFERENTIAL/PLATELET
Basophils Absolute: 0 10*3/uL (ref 0.0–0.1)
Basophils Relative: 0.3 % (ref 0.0–3.0)
Eosinophils Absolute: 0.1 10*3/uL (ref 0.0–0.7)
Eosinophils Relative: 1.6 % (ref 0.0–5.0)
HCT: 41.2 % (ref 36.0–46.0)
Hemoglobin: 14.1 g/dL (ref 12.0–15.0)
Lymphocytes Relative: 27.6 % (ref 12.0–46.0)
Lymphs Abs: 2.2 10*3/uL (ref 0.7–4.0)
MCHC: 34.1 g/dL (ref 30.0–36.0)
MCV: 99 fl (ref 78.0–100.0)
Monocytes Absolute: 0.7 10*3/uL (ref 0.1–1.0)
Monocytes Relative: 8.1 % (ref 3.0–12.0)
Neutro Abs: 5.1 10*3/uL (ref 1.4–7.7)
Neutrophils Relative %: 62.4 % (ref 43.0–77.0)
Platelets: 331 10*3/uL (ref 150.0–400.0)
RBC: 4.16 Mil/uL (ref 3.87–5.11)
RDW: 12.4 % (ref 11.5–15.5)
WBC: 8.1 10*3/uL (ref 4.0–10.5)

## 2024-12-02 LAB — TROPONIN I (HIGH SENSITIVITY): High Sens Troponin I: <2 ng/L (ref 2–17)

## 2024-12-02 MED ORDER — FLUTICASONE PROPIONATE 50 MCG/ACT NA SUSP: 2.0000 | Freq: Every day | NASAL | 1 refills | Status: AC

## 2024-12-02 MED ORDER — BENZONATATE 100 MG PO CAPS
100.0000 mg | ORAL_CAPSULE | Freq: Three times a day (TID) | ORAL | 0 refills | Status: AC | PRN
  Filled 2024-12-02: qty 30, 10d supply, fill #0

## 2024-12-02 MED ORDER — BENZONATATE 100 MG PO CAPS: 100.0000 mg | ORAL_CAPSULE | Freq: Three times a day (TID) | ORAL | 0 refills | Status: AC | PRN

## 2024-12-02 MED ORDER — FLUTICASONE PROPIONATE 50 MCG/ACT NA SUSP
2.0000 | Freq: Every day | NASAL | 1 refills | Status: AC
  Filled 2024-12-02: qty 16, 30d supply, fill #0

## 2024-12-02 NOTE — Telephone Encounter (Signed)
 Appt scheduled

## 2024-12-02 NOTE — Progress Notes (Signed)
 "  Subjective:    Patient ID: Adriana Lopez, female    DOB: 1961/08/31, 64 y.o.   MRN: [id number]  HPI  Discussed the use of AI scribe software for clinical note transcription with the patient, who gave verbal consent to proceed.  History of Present Illness   Adriana Lopez is a 64 year old female who presents with chest tightness and congestion.  She has had intermittent chest tightness for two weeks. The sensation feels like the start of a chest cold, does not worsen with activity, and is not associated with chest pain, jaw pain, shoulder pain, or other typical cardiac symptoms.  Two days ago she developed congestion and sinus pressure with clear nasal discharge. She denies cough, fever, chills, or body aches. She has taken Delsym at night, which has improved sleep despite congestion. No wheezing or sob. She mentions some exposure to RSV.  She previously smoked half a pack per day for twenty years and quit thirty years ago. She had pneumonia several years ago. She denies asthma, inhaler use, wheezing, shortness of breath, or leg swelling. She notes recent triceps soreness and has neck arthritis.  She works as a runner, broadcasting/film/video and has felt mildly fatigued over the past two weeks, which she attributes to a busy schedule without change in sleep pattern.             Review of Systems  Constitutional:  Positive for fatigue. Negative for chills and fever.  HENT:  Positive for congestion and sinus pressure. Negative for sinus pain.        Faint early sinus pressure.  Respiratory:  Positive for cough. Negative for wheezing.        Described atypical intermittent chest tightness for 2 weeks. No associated cardiac symptoms. No wheezing. No shortness of breath. No leg pain  Cardiovascular:  Negative for chest pain and palpitations.       See respiratory description chest tightness.  Gastrointestinal:  Negative for abdominal pain and constipation.  Genitourinary:  Negative for dysuria and  frequency.  Musculoskeletal:  Negative for back pain, myalgias and neck stiffness.  Skin:  Negative for rash.  Hematological:  Negative for adenopathy.  Psychiatric/Behavioral:  Negative for behavioral problems and decreased concentration.     Past Medical History:  Diagnosis Date   Allergy     feline   Anxiety      Social History   Socioeconomic History   Marital status: Divorced    Spouse name: Not on file   Number of children: 2   Years of education: Not on file   Highest education level: Master's degree (e.g., MA, MS, MEng, MEd, MSW, MBA)  Occupational History    Employer: [employer]  Tobacco Use   Smoking status: Former    Types: Cigarettes   Smokeless tobacco: Never  Vaping Use   Vaping status: Never Used  Substance and Sexual Activity   Alcohol use: Yes    Alcohol/week: 7.0 standard drinks of alcohol    Types: 7 Glasses of wine per week   Drug use: No   Sexual activity: Yes  Other Topics Concern   Not on file  Social History Narrative   Exercise- walking   Social Drivers of Health   Tobacco Use: Medium Risk (12/02/2024)   Patient History    Smoking Tobacco Use: Former    Smokeless Tobacco Use: Never    Passive Exposure: Not on file  Financial Resource Strain: Low Risk (07/05/2024)   Overall Financial Resource  Strain (CARDIA)    Difficulty of Paying Living Expenses: Not very hard  Food Insecurity: No Food Insecurity (07/05/2024)   Epic    Worried About Programme Researcher, Broadcasting/film/video in the Last Year: Never true    Ran Out of Food in the Last Year: Never true  Transportation Needs: No Transportation Needs (07/05/2024)   Epic    Lack of Transportation (Medical): No    Lack of Transportation (Non-Medical): No  Physical Activity: Sufficiently Active (07/05/2024)   Exercise Vital Sign    Days of Exercise per Week: 7 days    Minutes of Exercise per Session: 30 min  Stress: Stress Concern Present (07/05/2024)   [employer] of Occupational Health -  Occupational Stress Questionnaire    Feeling of Stress: To some extent  Social Connections: Moderately Integrated (07/05/2024)   Social Connection and Isolation Panel    Frequency of Communication with Friends and Family: More than three times a week    Frequency of Social Gatherings with Friends and Family: Once a week    Attends Religious Services: 1 to 4 times per year    Active Member of [employer] or Organizations: No    Attends Engineer, Structural: Not on file    Marital Status: Living with partner  Intimate Partner Violence: Not on file  Depression (PHQ2-9): Low Risk (06/25/2022)   Depression (PHQ2-9)    PHQ-2 Score: 0  Alcohol Screen: Low Risk (07/05/2024)   Alcohol Screen    Last Alcohol Screening Score (AUDIT): 5  Housing: Unknown (07/05/2024)   Epic    Unable to Pay for Housing in the Last Year: No    Number of Times Moved in the Last Year: Not on file    Homeless in the Last Year: No  Utilities: Not on file  Health Literacy: Not on file    Past Surgical History:  Procedure Laterality Date   REFRACTIVE SURGERY  2012   Dr Meridee   SKIN CANCER EXCISION     right ear lobe   TONSILLECTOMY AND ADENOIDECTOMY      Family History  Problem Relation Age of Onset   Alzheimer's disease Mother    Heart disease Father        pacer   Cancer Father        dermatologic   Cancer Maternal Aunt        uterine   Alzheimer's disease Maternal Aunt    Diabetes Neg Hx    Stroke Neg Hx    Hypertension Neg Hx    Colon cancer Neg Hx    Throat cancer Neg Hx    Rectal cancer Neg Hx    Pancreatic cancer Neg Hx     Allergies[1]  Medications Ordered Prior to Encounter[2]  BP 128/78 (BP Location: Left Arm, Patient Position: Sitting)   Pulse 84   Temp 98.3 F (36.8 C) (Oral)   Resp 16   Ht 5' 4 (1.626 m)   Wt 120 lb (54.4 kg)   LMP 06/10/2013   SpO2 98%   BMI 20.60 kg/m        Objective:   Physical Exam  General Mental Status- Alert. General Appearance- Not in  acute distress.   Skin General: Color- Normal Color. Moisture- Normal Moisture.  Neck  No JVD. No tracheal deviation.  Chest and Lung Exam Auscultation: Breath Sounds:-CTA  Cardiovascular Auscultation:Rythm- RRR Murmurs & Other Heart Sounds:Auscultation of the heart reveals- No Murmurs.  Abdomen Inspection:-Inspeection Normal. Palpation/Percussion:Note:No mass. Palpation and Percussion of  the abdomen reveal- Non Tender, Non Distended + BS, no rebound or guarding.   Neurologic Cranial Nerve exam:- CN III-XII intact(No nystagmus), symmetric smile. Strength:- 5/5 equal and symmetric strength both upper and lower extremities.   Lower ext- no pedal edema, negative homans signs. Calf symmetric bilaterally.     Assessment & Plan:   Patient Instructions  Acute upper respiratory infection Likely early upper respiratory infection with nasal congestion and sinus pressure. No significant findings of sinus infection or  bronchitis symptoms. - Prescribed Flonase  nasal spray. - Provided benzonatate  for potential cough. -if worse sinus pain or productive cough will rx antibiotic but not needed presently.   Evaluation of slight chest tightness Intermittent chest tightness with normal EKG. Since this preceded uri symptom by about 2 weeks did ekg - Ordered troponin test x1 since symptoms for 2 weeks. -if more tightness get chest xray. if any wheezing or dyspnea let me know. - Advised emergency care if symptoms worsen or new symptoms develop.  Exposure to respiratory syncytial virus (RSV) Recent exposure to RSV, symptoms not typical for RSV. - Ordered RSV swab test.  Fatigue Mild fatigue possibly related to upper respiratory infection or other conditions. - Ordered CBC and metabolic panel.  Follow up in 10 days or sooner if needed   Myia Bergh, PA-C        [1]  Allergies Allergen Reactions   Amoxicillin  Diarrhea    Also had C Diff  [2]  Current Outpatient Medications  on File Prior to Visit  Medication Sig Dispense Refill   ALPRAZolam  (XANAX ) 0.25 MG tablet Take 1 tablet (0.25 mg total) by mouth 2 (two) times daily as needed for anxiety. 20 tablet 0   azelastine  (ASTELIN ) 0.1 % nasal spray Place 1 spray into both nostrils 2 (two) times daily. Use in each nostril as directed 30 mL 12   clotrimazole -betamethasone  (LOTRISONE ) cream Apply 1 Application topically 2 (two) times daily. 45 g 5   cyclobenzaprine  (FLEXERIL ) 10 MG tablet Take 1 tablet (10 mg total) by mouth 3 (three) times daily as needed for muscle spasms. 30 tablet 0   diclofenac  (CATAFLAM ) 50 MG tablet Take 1 tablet (50 mg total) by mouth 3 (three) times daily. 90 tablet 3   erythromycin  ophthalmic ointment Place 1 cm ribbon into both eyes 4 (four) times daily for 5 days. 3.5 g 0   estradiol  (VIVELLE -DOT) 0.05 MG/24HR patch Place 1 patch (0.05 mg total) onto the skin twice weekly 8 patch 5   estradiol  (VIVELLE -DOT) 0.05 MG/24HR patch Place 1 patch (0.05 mg total) onto the skin 2 (two) times a week. 24 patch 2   estradiol  (VIVELLE -DOT) 0.05 MG/24HR patch Place 1 patch (0.05 mg total) onto the skin 2 (two) times a week. 8 patch 3   estradiol  (VIVELLE -DOT) 0.05 MG/24HR patch Place 1 patch (0.05 mg total) onto the skin 2 (two) times a week. 24 patch 1   estradiol  (VIVELLE -DOT) 0.05 MG/24HR patch Place 1 patch (0.05 mg total) onto the skin 2 (two) times a week. 8 patch 1   progesterone  (PROMETRIUM ) 100 MG capsule Take 100 mg by mouth at bedtime.     progesterone  (PROMETRIUM ) 100 MG capsule Take 1 capsule (100 mg total) by mouth at bedtime. 90 capsule 1   progesterone  (PROMETRIUM ) 100 MG capsule Take 1 capsule (100 mg total) by mouth at bedtime. 90 capsule 3   triazolam  (HALCION ) 0.25 MG tablet Take 1 tablet (0.25 mg total) by mouth once daily 1 hour prior to dental  appt. bring bottle to appt 3 tablet 0   valACYclovir  (VALTREX ) 500 MG tablet Take 1 tablet (500 mg total) by mouth 2 (two) times daily until gone.  20 tablet 1   No current facility-administered medications on file prior to visit.   "

## 2024-12-02 NOTE — Patient Instructions (Addendum)
 Acute upper respiratory infection Likely early upper respiratory infection with nasal congestion and sinus pressure. No significant findings of sinus infection or  bronchitis symptoms. - Prescribed Flonase  nasal spray. - Provided benzonatate  for potential cough. -if worse sinus pain or productive cough will rx antibiotic but not needed presently.   Evaluation of slight chest tightness Intermittent chest tightness with normal EKG. Since this preceded uri symptom by about 2 weeks did ekg - Ordered troponin test x1 since symptoms for 2 weeks. -if more tightness get chest xray. if any wheezing or dyspnea let me know. - Advised emergency care if symptoms worsen or new symptoms develop.  Exposure to respiratory syncytial virus (RSV) Recent exposure to RSV, symptoms not typical for RSV. - Ordered RSV swab test.  Fatigue Mild fatigue possibly related to upper respiratory infection or other conditions. - Ordered CBC and metabolic panel.  Follow up in 10 days or sooner if needed

## 2024-12-02 NOTE — Telephone Encounter (Signed)
 FYI Only or Action Required?: FYI only for provider: appointment scheduled on rescheduled to 2/5.  Patient was last seen in primary care on 07/06/2024 by [name], Jamee SAUNDERS, DO.  Called Nurse Triage reporting No chief complaint on file..  Symptoms began a week ago.  Interventions attempted: OTC medications: see note yesterday and Rest, hydration, or home remedies.  Symptoms are: unchanged.  Triage Disposition: See PCP When Office is Open (Within 3 Days)  Patient/caregiver understands and will follow disposition?: Yes   Message from [facility] sent at 12/02/2024  8:51 AM EST  Reason for Triage: Patient has an acute appnt tomorrow 12-03-24 ,but was trying to change it to today but states she is having chest , congestion , and painful sinus headache.   Reason for Disposition  Caller requesting an appointment, triage offered and declined  Answer Assessment - Initial Assessment Questions Denies any new or worsening symptoms since call yesterday- was advised ED and ended up scheduled for ACUTE visit by the office. Sinus and chest congestion tightness, sinus headache.   Acute visit rescheduled for today as the office made the initial appt. Advised ED is still recommended.   1. REASON FOR CALL or QUESTION: What is your reason for calling today? or How can I best     Patient calling to reschedule her acute appt from Friday to today as she is off today. Ok with seeing whoever.  2. CALLER: Document the source of call. (e.g., laboratory staff, caregiver or patient).     Patient  Protocols used: PCP Call - No Triage-A-AH

## 2024-12-03 ENCOUNTER — Ambulatory Visit: Admitting: Internal Medicine

## 2024-12-03 ENCOUNTER — Other Ambulatory Visit (HOSPITAL_BASED_OUTPATIENT_CLINIC_OR_DEPARTMENT_OTHER): Payer: Self-pay

## 2024-12-03 LAB — RSV SCREEN (NASOPHARYNGEAL) NOT AT ARMC
MICRO NUMBER:: 17553501
RESULT:: NOT DETECTED
SPECIMEN QUALITY:: ADEQUATE

## 2024-12-09 ENCOUNTER — Ambulatory Visit: Admitting: Psychology

## 2025-01-06 ENCOUNTER — Ambulatory Visit: Admitting: Psychology
